# Patient Record
Sex: Female | Born: 1985 | Race: Black or African American | Hispanic: No | Marital: Single | Smoking: Never smoker
Health system: Southern US, Community
[De-identification: ages and names within clinical notes are randomized; demographics above are authoritative.]

## PROBLEM LIST (undated history)

## (undated) ENCOUNTER — Inpatient Hospital Stay (HOSPITAL_COMMUNITY): Payer: Self-pay

## (undated) DIAGNOSIS — J189 Pneumonia, unspecified organism: Secondary | ICD-10-CM

## (undated) DIAGNOSIS — IMO0002 Reserved for concepts with insufficient information to code with codable children: Secondary | ICD-10-CM

## (undated) DIAGNOSIS — I10 Essential (primary) hypertension: Secondary | ICD-10-CM

## (undated) DIAGNOSIS — D649 Anemia, unspecified: Secondary | ICD-10-CM

## (undated) DIAGNOSIS — R87619 Unspecified abnormal cytological findings in specimens from cervix uteri: Secondary | ICD-10-CM

## (undated) HISTORY — PX: FACIAL RECONSTRUCTION SURGERY: SHX631

---
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
## 2003-03-21 ENCOUNTER — Inpatient Hospital Stay (HOSPITAL_COMMUNITY): Admission: AD | Admit: 2003-03-21 | Discharge: 2003-03-21 | Payer: Self-pay | Admitting: Family Medicine

## 2003-03-25 ENCOUNTER — Inpatient Hospital Stay (HOSPITAL_COMMUNITY): Admission: AD | Admit: 2003-03-25 | Discharge: 2003-03-25 | Payer: Self-pay | Admitting: Obstetrics and Gynecology

## 2003-06-05 ENCOUNTER — Emergency Department (HOSPITAL_COMMUNITY): Admission: EM | Admit: 2003-06-05 | Discharge: 2003-06-05 | Payer: Self-pay

## 2003-08-28 ENCOUNTER — Inpatient Hospital Stay (HOSPITAL_COMMUNITY): Admission: AD | Admit: 2003-08-28 | Discharge: 2003-08-28 | Payer: Self-pay | Admitting: Family Medicine

## 2004-04-08 ENCOUNTER — Emergency Department (HOSPITAL_COMMUNITY): Admission: EM | Admit: 2004-04-08 | Discharge: 2004-04-08 | Payer: Self-pay | Admitting: Family Medicine

## 2005-02-25 ENCOUNTER — Inpatient Hospital Stay (HOSPITAL_COMMUNITY): Admission: AD | Admit: 2005-02-25 | Discharge: 2005-02-25 | Payer: Self-pay | Admitting: Obstetrics and Gynecology

## 2005-03-02 ENCOUNTER — Inpatient Hospital Stay (HOSPITAL_COMMUNITY): Admission: AD | Admit: 2005-03-02 | Discharge: 2005-03-04 | Payer: Self-pay | Admitting: *Deleted

## 2005-03-02 ENCOUNTER — Ambulatory Visit: Payer: Self-pay | Admitting: Obstetrics and Gynecology

## 2006-01-09 ENCOUNTER — Emergency Department (HOSPITAL_COMMUNITY): Admission: EM | Admit: 2006-01-09 | Discharge: 2006-01-09 | Payer: Self-pay | Admitting: Emergency Medicine

## 2006-02-26 ENCOUNTER — Emergency Department (HOSPITAL_COMMUNITY): Admission: EM | Admit: 2006-02-26 | Discharge: 2006-02-26 | Payer: Self-pay | Admitting: Emergency Medicine

## 2006-06-18 ENCOUNTER — Emergency Department (HOSPITAL_COMMUNITY): Admission: EM | Admit: 2006-06-18 | Discharge: 2006-06-18 | Payer: Self-pay | Admitting: Emergency Medicine

## 2007-01-15 ENCOUNTER — Other Ambulatory Visit: Admission: RE | Admit: 2007-01-15 | Discharge: 2007-01-15 | Payer: Self-pay | Admitting: Gynecology

## 2007-03-30 ENCOUNTER — Emergency Department (HOSPITAL_COMMUNITY): Admission: EM | Admit: 2007-03-30 | Discharge: 2007-03-31 | Payer: Self-pay | Admitting: Emergency Medicine

## 2008-04-30 ENCOUNTER — Emergency Department (HOSPITAL_COMMUNITY): Admission: EM | Admit: 2008-04-30 | Discharge: 2008-04-30 | Payer: Self-pay | Admitting: Emergency Medicine

## 2009-03-27 ENCOUNTER — Inpatient Hospital Stay (HOSPITAL_COMMUNITY): Admission: AD | Admit: 2009-03-27 | Discharge: 2009-03-27 | Payer: Self-pay | Admitting: Maternal and Fetal Medicine

## 2009-08-09 ENCOUNTER — Inpatient Hospital Stay (HOSPITAL_COMMUNITY): Admission: AD | Admit: 2009-08-09 | Discharge: 2009-08-20 | Payer: Self-pay | Admitting: Obstetrics and Gynecology

## 2009-08-10 ENCOUNTER — Encounter: Payer: Self-pay | Admitting: Obstetrics and Gynecology

## 2009-08-11 ENCOUNTER — Encounter (HOSPITAL_COMMUNITY): Payer: Self-pay | Admitting: Obstetrics and Gynecology

## 2009-08-17 ENCOUNTER — Encounter (INDEPENDENT_AMBULATORY_CARE_PROVIDER_SITE_OTHER): Payer: Self-pay | Admitting: Obstetrics and Gynecology

## 2009-09-30 ENCOUNTER — Emergency Department (HOSPITAL_COMMUNITY): Admission: EM | Admit: 2009-09-30 | Discharge: 2009-09-30 | Payer: Self-pay | Admitting: Emergency Medicine

## 2010-04-25 ENCOUNTER — Emergency Department (HOSPITAL_COMMUNITY): Admission: EM | Admit: 2010-04-25 | Discharge: 2010-04-26 | Payer: Self-pay | Admitting: Emergency Medicine

## 2010-10-09 ENCOUNTER — Encounter: Payer: Self-pay | Admitting: Obstetrics and Gynecology

## 2010-12-20 LAB — CBC
Hemoglobin: 11.7 g/dL — ABNORMAL LOW (ref 12.0–15.0)
RBC: 5.1 MIL/uL (ref 3.87–5.11)
RDW: 14.7 % (ref 11.5–15.5)

## 2010-12-21 LAB — CBC
HCT: 38.8 % (ref 36.0–46.0)
Hemoglobin: 10.9 g/dL — ABNORMAL LOW (ref 12.0–15.0)
Hemoglobin: 12.2 g/dL (ref 12.0–15.0)
Hemoglobin: 12.3 g/dL (ref 12.0–15.0)
MCHC: 31.5 g/dL (ref 30.0–36.0)
MCHC: 32.2 g/dL (ref 30.0–36.0)
MCHC: 32.4 g/dL (ref 30.0–36.0)
MCV: 70.9 fL — ABNORMAL LOW (ref 78.0–100.0)
MCV: 72.3 fL — ABNORMAL LOW (ref 78.0–100.0)
RBC: 4.71 MIL/uL (ref 3.87–5.11)
RBC: 5.35 MIL/uL — ABNORMAL HIGH (ref 3.87–5.11)
RDW: 15.4 % (ref 11.5–15.5)
RDW: 15.4 % (ref 11.5–15.5)
WBC: 10 10*3/uL (ref 4.0–10.5)

## 2010-12-21 LAB — TYPE AND SCREEN
ABO/RH(D): O POS
Antibody Screen: NEGATIVE

## 2010-12-21 LAB — WET PREP, GENITAL
Trich, Wet Prep: NONE SEEN
Yeast Wet Prep HPF POC: NONE SEEN

## 2010-12-21 LAB — COMPREHENSIVE METABOLIC PANEL
Alkaline Phosphatase: 79 U/L (ref 39–117)
BUN: 6 mg/dL (ref 6–23)
Calcium: 7.8 mg/dL — ABNORMAL LOW (ref 8.4–10.5)
Glucose, Bld: 102 mg/dL — ABNORMAL HIGH (ref 70–99)
Total Protein: 7.3 g/dL (ref 6.0–8.3)

## 2010-12-21 LAB — URIC ACID: Uric Acid, Serum: 4.2 mg/dL (ref 2.4–7.0)

## 2010-12-21 LAB — LACTATE DEHYDROGENASE: LDH: 118 U/L (ref 94–250)

## 2010-12-25 LAB — URINALYSIS, ROUTINE W REFLEX MICROSCOPIC
Bilirubin Urine: NEGATIVE
Nitrite: NEGATIVE
Specific Gravity, Urine: 1.02 (ref 1.005–1.030)
Urobilinogen, UA: 0.2 mg/dL (ref 0.0–1.0)

## 2010-12-25 LAB — GC/CHLAMYDIA PROBE AMP, GENITAL: Chlamydia, DNA Probe: NEGATIVE

## 2010-12-25 LAB — WET PREP, GENITAL

## 2010-12-25 LAB — CBC
MCHC: 32.9 g/dL (ref 30.0–36.0)
MCV: 69.2 fL — ABNORMAL LOW (ref 78.0–100.0)
Platelets: 267 10*3/uL (ref 150–400)
RDW: 16.7 % — ABNORMAL HIGH (ref 11.5–15.5)
WBC: 8.6 10*3/uL (ref 4.0–10.5)

## 2010-12-25 LAB — HCG, QUANTITATIVE, PREGNANCY: hCG, Beta Chain, Quant, S: 14673 m[IU]/mL — ABNORMAL HIGH (ref <5)

## 2011-01-27 ENCOUNTER — Inpatient Hospital Stay (HOSPITAL_COMMUNITY)
Admission: AD | Admit: 2011-01-27 | Discharge: 2011-01-27 | Disposition: A | Discharge status: Home / Self Care | Payer: Self-pay | Source: Ambulatory Visit | Attending: Obstetrics and Gynecology | Admitting: Obstetrics and Gynecology

## 2011-01-27 ENCOUNTER — Other Ambulatory Visit: Payer: Self-pay | Admitting: Obstetrics and Gynecology

## 2011-01-27 DIAGNOSIS — N949 Unspecified condition associated with female genital organs and menstrual cycle: Secondary | ICD-10-CM | POA: Insufficient documentation

## 2011-01-27 DIAGNOSIS — N938 Other specified abnormal uterine and vaginal bleeding: Secondary | ICD-10-CM | POA: Insufficient documentation

## 2011-01-27 LAB — POCT PREGNANCY, URINE: Preg Test, Ur: NEGATIVE

## 2011-01-30 LAB — URINALYSIS, ROUTINE W REFLEX MICROSCOPIC
Nitrite: NEGATIVE
Specific Gravity, Urine: 1.02 (ref 1.005–1.030)
Urobilinogen, UA: 0.2 mg/dL (ref 0.0–1.0)

## 2011-02-03 NOTE — Discharge Summary (Signed)
NAMEGREENLEE, ANCHETA                ACCOUNT NO.:  [id number]   MEDICAL RECORD NO.:  [id number]          PATIENT TYPE:  INP   LOCATION:  9317                          FACILITY:  [facility]   PHYSICIAN:  Phil D. Okey Dupre, M.D.     DATE OF BIRTH:  1986-08-05   DATE OF ADMISSION:  03/02/2005  DATE OF DISCHARGE:  03/04/2005                                 DISCHARGE SUMMARY   The patient is a 25 year old black female who was admitted with primary  herpes simplex infection extraordinarily severe that did not allow her to  avoid.  A Foley catheter was placed.  The patient has been on two days of  Acyclovir IV and markedly improved.  She also was positive for Chlamydia and  was treated with Rocephin.  She is ready to go home.  Foley catheter will be  removed.  I will have the patient come back to the GYN clinic in 2 weeks.  She is to continue sitz baths at home and Valtrex and Percocet for pain.  We  have also discussed with her the necessity of getting her [facility] to the  [facility] for suppression of herpes simplex as well as treated for  Chlamydia, and she said she is going to take care of that.   IMPRESSION:  Resolving primary herpes simplex, vulvitis.       PDR/MEDQ  D:  03/04/2005  T:  03/04/2005  Job:  [id number]

## 2011-04-19 ENCOUNTER — Emergency Department (HOSPITAL_COMMUNITY)
Admission: EM | Admit: 2011-04-19 | Discharge: 2011-04-19 | Disposition: A | Discharge status: Home / Self Care | Payer: Self-pay | Attending: Emergency Medicine | Admitting: Emergency Medicine

## 2011-04-19 DIAGNOSIS — K029 Dental caries, unspecified: Secondary | ICD-10-CM | POA: Insufficient documentation

## 2011-04-19 DIAGNOSIS — K0381 Cracked tooth: Secondary | ICD-10-CM | POA: Insufficient documentation

## 2011-04-19 DIAGNOSIS — K089 Disorder of teeth and supporting structures, unspecified: Secondary | ICD-10-CM | POA: Insufficient documentation

## 2011-04-19 DIAGNOSIS — K047 Periapical abscess without sinus: Secondary | ICD-10-CM | POA: Insufficient documentation

## 2011-04-19 DIAGNOSIS — R220 Localized swelling, mass and lump, head: Secondary | ICD-10-CM | POA: Insufficient documentation

## 2011-07-04 LAB — HEMOGLOBIN AND HEMATOCRIT, BLOOD: Hemoglobin: 10.8 — ABNORMAL LOW

## 2011-07-04 LAB — D-DIMER, QUANTITATIVE: D-Dimer, Quant: <0.22

## 2011-07-29 ENCOUNTER — Other Ambulatory Visit: Payer: Self-pay

## 2011-07-29 ENCOUNTER — Emergency Department (HOSPITAL_COMMUNITY)
Admission: EM | Admit: 2011-07-29 | Discharge: 2011-07-29 | Disposition: A | Discharge status: Home / Self Care | Payer: Self-pay | Attending: Emergency Medicine | Admitting: Emergency Medicine

## 2011-07-29 ENCOUNTER — Emergency Department (HOSPITAL_COMMUNITY): Payer: Self-pay

## 2011-07-29 ENCOUNTER — Encounter: Payer: Self-pay | Admitting: *Deleted

## 2011-07-29 DIAGNOSIS — D649 Anemia, unspecified: Secondary | ICD-10-CM | POA: Insufficient documentation

## 2011-07-29 DIAGNOSIS — R079 Chest pain, unspecified: Secondary | ICD-10-CM | POA: Insufficient documentation

## 2011-07-29 LAB — POCT I-STAT, CHEM 8
BUN: 14 mg/dL (ref 6–23)
Calcium, Ion: 1.17 mmol/L (ref 1.12–1.32)
Creatinine, Ser: 0.7 mg/dL (ref 0.50–1.10)
TCO2: 26 mmol/L (ref 0–100)

## 2011-07-29 LAB — URINALYSIS, ROUTINE W REFLEX MICROSCOPIC
Bilirubin Urine: NEGATIVE
Nitrite: NEGATIVE
Protein, ur: NEGATIVE mg/dL
Specific Gravity, Urine: 1.027 (ref 1.005–1.030)
Urobilinogen, UA: 0.2 mg/dL (ref 0.0–1.0)

## 2011-07-29 LAB — URINE MICROSCOPIC-ADD ON

## 2011-07-29 LAB — CBC
HCT: 24.2 % — ABNORMAL LOW (ref 36.0–46.0)
MCV: 58.5 fL — ABNORMAL LOW (ref 78.0–100.0)
RDW: 19.8 % — ABNORMAL HIGH (ref 11.5–15.5)
WBC: 9.4 10*3/uL (ref 4.0–10.5)

## 2011-07-29 LAB — TROPONIN I: Troponin I: <0.3 ng/mL (ref <0.30)

## 2011-07-29 LAB — BASIC METABOLIC PANEL
BUN: 13 mg/dL (ref 6–23)
Chloride: 101 mEq/L (ref 96–112)
Creatinine, Ser: 0.68 mg/dL (ref 0.50–1.10)
GFR calc Af Amer: >90 mL/min (ref >90)

## 2011-07-29 LAB — ABO/RH: ABO/RH(D): O POS

## 2011-07-29 LAB — PREGNANCY, URINE: Preg Test, Ur: NEGATIVE

## 2011-07-29 MED ORDER — ASPIRIN 325 MG PO TABS: 325.0000 mg | ORAL_TABLET | ORAL | Status: DC

## 2011-07-29 MED ORDER — FERROUS SULFATE 325 (65 FE) MG PO TABS
325.0000 mg | ORAL_TABLET | Freq: Once | ORAL | Status: AC
  Administered 2011-07-29: 325 mg via ORAL
  Filled 2011-07-29 (×2): qty 1

## 2011-07-29 MED ORDER — FERROUS SULFATE 325 (65 FE) MG PO TABS: 325.0000 mg | ORAL_TABLET | Freq: Every day | ORAL | Status: DC

## 2011-07-29 NOTE — ED Notes (Signed)
Received pt through change of shift report. Family at bedside. Pt resting with eyes closed.

## 2011-07-29 NOTE — ED Notes (Signed)
Pt c/o CP since 2-3 am this morning.  States pain increases with movement and inspiration.

## 2011-07-29 NOTE — ED Notes (Signed)
Developed CP around 1300 Friday afternoon, constant, stabbing, pinpoints with one finger to L upper chest, also sob & light headedness, (denies other sx), no aggravating factors, eased off with lortab elixer, also took gas x & tums, rates 8/10.

## 2011-07-29 NOTE — ED Notes (Signed)
LMP 06/23/2011, "period/bleeding still continues" per pt

## 2011-07-29 NOTE — ED Provider Notes (Signed)
History     CSN: [id number] Arrival date & time: 07/29/2011  2:32 AM   First MD Initiated Contact with Patient 07/29/11 [phone]      Chief Complaint  Patient presents with  . Chest Pain    (Consider location/radiation/quality/duration/timing/severity/associated sxs/prior treatment) HPI The patient is a 25 year old female who states that she has had chest pain that began at 1 PM on Friday. She has pain in her left chest that she describes as constant and stabbing. She rates this as an 8/10. Patient initially reports no aggravating factors. She does tell me that this is worse when she is angry or upset about something. Patient does not have a history of anemia but has had heavy vaginal bleeding for the past for a half. She states that she was seen by OB/GYN about this and tested for STDs but no further workup was done by her report. Patient is not on high-dose NSAIDs for this or any form of oral contraceptives. Patient currently has no pain while laying in her stretcher. She denies any shortness of breath, nausea, or vomiting. Earlier the patient took Gas-X without any relief. She did take Lortab which seemed to help her out. There are no other associated modifying factors. History reviewed. No pertinent past medical history.  Past Surgical History  Procedure Date  . Cesarean section     Family History  Problem Relation Age of Onset  . Stroke Mother   . Diabetes Mother   . Hypertension Mother   . Coronary artery disease Mother   . Hypertension Father     History  Substance Use Topics  . Smoking status: Former Smoker    Quit date: 07/28/2004  . Smokeless tobacco: Not on file  . Alcohol Use: Yes     occaisionally    OB History    Grav Para Term Preterm Abortions TAB SAB Ect Mult Living                  Review of Systems  Constitutional: Negative.   HENT: Negative.   Eyes: Negative.   Respiratory: Negative.   Cardiovascular: Positive for chest pain.  Gastrointestinal:  Negative.   Genitourinary: Positive for vaginal bleeding.  Musculoskeletal: Negative.   Skin: Negative.   Neurological: Negative.   Hematological: Negative.   Psychiatric/Behavioral: Negative.   All other systems reviewed and are negative.    Allergies  Latex and Oxycontin  Home Medications   Current Outpatient Rx  Name Route Sig Dispense Refill  . ONE-A-DAY ENERGY PO Oral Take 1 tablet by mouth daily.      Marland Kitchen FERROUS SULFATE 325 (65 FE) MG PO TABS Oral Take 1 tablet (325 mg total) by mouth daily. 30 tablet 0    BP 110/58  Pulse 96  Temp(Src) 98.3 F (36.8 C) (Oral)  Resp 16  SpO2 100%  LMP 06/23/2011  Physical Exam  Nursing note and vitals reviewed. Constitutional: She is oriented to person, place, and time. She appears well-developed and well-nourished. No distress.  HENT:  Head: Normocephalic and atraumatic.  Eyes: Conjunctivae and EOM are normal. Pupils are equal, round, and reactive to light.  Neck: Normal range of motion.  Cardiovascular: Normal rate, regular rhythm, normal heart sounds and intact distal pulses.  Exam reveals no gallop and no friction rub.   No murmur heard. Pulmonary/Chest: Breath sounds normal. No respiratory distress. She has no wheezes. She has no rales.  Abdominal: Soft. Bowel sounds are normal. She exhibits no distension. There is no tenderness.  There is no rebound and no guarding.  Musculoskeletal: Normal range of motion.  Neurological: She is alert and oriented to person, place, and time. No cranial nerve deficit. Coordination normal.  Skin: Skin is warm and dry. No rash noted. No erythema.  Psychiatric: She has a normal mood and affect.    ED Course  Procedures (including critical care time)  Date: 07/29/2011  Rate: 74  Rhythm: normal sinus rhythm  QRS Axis: normal  Intervals: normal  ST/T Wave abnormalities: normal  Conduction Disutrbances:none  Narrative Interpretation: No finding of acute ischemia  Old EKG Reviewed: none  available and unchanged  Labs Reviewed  CBC - Abnormal; Notable for the following:    Hemoglobin 7.2 (*)    HCT 24.2 (*)    MCV 58.5 (*)    MCH 17.4 (*)    MCHC 29.8 (*)    RDW 19.8 (*)    All other components within normal limits  POCT I-STAT, CHEM 8 - Abnormal; Notable for the following:    Hemoglobin 9.5 (*)    HCT 28.0 (*)    All other components within normal limits  URINALYSIS, ROUTINE W REFLEX MICROSCOPIC - Abnormal; Notable for the following:    Hgb urine dipstick MODERATE (*)    All other components within normal limits  BASIC METABOLIC PANEL  TROPONIN I  TYPE AND SCREEN  PREGNANCY, URINE  URINE MICROSCOPIC-ADD ON  ABO/RH  PREPARE RBC (CROSSMATCH)  I-STAT TROPONIN I   Dg Chest 2 View  07/29/2011  *RADIOLOGY REPORT*  Clinical Data: Chest pain, shortness of breath and syncope; dizziness.  CHEST - 2 VIEW  Comparison: Chest radiograph performed 09/30/2009  Findings: The lungs are well-aerated and clear.  There is no evidence of focal opacification, pleural effusion or pneumothorax. There is mild stable prominence of vasculature at the left midlung zone.  The heart is borderline normal in size; the mediastinal contour is within normal limits.  No acute osseous abnormalities are seen.  IMPRESSION: No acute cardiopulmonary process seen.  Original Report Authenticated By: Tonia Ghent, M.D.     1. Anemia   2. Chest pain       MDM  The patient was evaluated by myself. Though she initially had chest pain this had resolved by the time of my evaluation. Patient had CBC and i-STAT as well as renal panel and chest x-ray with EKG. EKG was unremarkable. Patient's initial CBC with her hemoglobin was 7.2. Within 7 minutes and additional lab reported the hemoglobin to be 9.5. Patient did not have any chest pain, lightheadedness or shortness of breath prior to discharge today. I did speak to numerous OB/GYN Strine to reach someone who had seen the patient previously. She was unable to  recall who that physician was. Patient reported that her bleeding was down to 3 pads a day. This was tapering off from what it had been earlier in the month. Troponin and chest x-ray were also negative. Patient was given a dose of iron and was written for a prescription for this. She was told to followup with her OB/GYN that she saw previously. She is to return immediately if she develops chest pain worsening bleeding or shortness of breath. Patient was discharged in good condition.        Cyndra Numbers, MD 07/29/11 1037

## 2011-07-29 NOTE — ED Notes (Signed)
0240 VS deleted (wrong pt).

## 2011-07-29 NOTE — ED Notes (Signed)
Awaiting medication from pharmacy.

## 2011-07-31 LAB — TYPE AND SCREEN
Unit division: 0
Unit division: 0

## 2011-09-16 ENCOUNTER — Inpatient Hospital Stay (HOSPITAL_COMMUNITY)
Admission: AD | Admit: 2011-09-16 | Discharge: 2011-09-16 | Disposition: A | Discharge status: Home / Self Care | Payer: Self-pay | Source: Ambulatory Visit | Attending: Obstetrics & Gynecology | Admitting: Obstetrics & Gynecology

## 2011-09-16 ENCOUNTER — Encounter (HOSPITAL_COMMUNITY): Payer: Self-pay

## 2011-09-16 DIAGNOSIS — N938 Other specified abnormal uterine and vaginal bleeding: Secondary | ICD-10-CM | POA: Insufficient documentation

## 2011-09-16 DIAGNOSIS — D649 Anemia, unspecified: Secondary | ICD-10-CM | POA: Insufficient documentation

## 2011-09-16 DIAGNOSIS — N939 Abnormal uterine and vaginal bleeding, unspecified: Secondary | ICD-10-CM

## 2011-09-16 DIAGNOSIS — N949 Unspecified condition associated with female genital organs and menstrual cycle: Secondary | ICD-10-CM | POA: Insufficient documentation

## 2011-09-16 DIAGNOSIS — N898 Other specified noninflammatory disorders of vagina: Secondary | ICD-10-CM

## 2011-09-16 HISTORY — DX: Anemia, unspecified: D64.9

## 2011-09-16 LAB — CBC
HCT: 28.2 % — ABNORMAL LOW (ref 36.0–46.0)
Hemoglobin: 8.3 g/dL — ABNORMAL LOW (ref 12.0–15.0)
MCH: 17 pg — ABNORMAL LOW (ref 26.0–34.0)
MCHC: 29.4 g/dL — ABNORMAL LOW (ref 30.0–36.0)
MCV: 57.9 fL — ABNORMAL LOW (ref 78.0–100.0)

## 2011-09-16 LAB — URINALYSIS, ROUTINE W REFLEX MICROSCOPIC
Leukocytes, UA: NEGATIVE
Specific Gravity, Urine: <1.005 — ABNORMAL LOW (ref 1.005–1.030)
Urobilinogen, UA: 0.2 mg/dL (ref 0.0–1.0)

## 2011-09-16 LAB — WET PREP, GENITAL: Yeast Wet Prep HPF POC: NONE SEEN

## 2011-09-16 LAB — POCT PREGNANCY, URINE: Preg Test, Ur: NEGATIVE

## 2011-09-16 LAB — URINE MICROSCOPIC-ADD ON

## 2011-09-16 MED ORDER — NORGESTIMATE-ETH ESTRADIOL 0.25-35 MG-MCG PO TABS: 1.0000 | ORAL_TABLET | Freq: Every day | ORAL | Status: DC

## 2011-09-16 NOTE — Progress Notes (Signed)
Pt reports having vaginal bleeding x 2 weeks.  Had period at beginning of December and then one started 2 weeks after. Pt has felt weak and went to [facility] and passed a clot the size of a baseball.

## 2011-09-16 NOTE — ED Provider Notes (Signed)
History     Chief Complaint  Patient presents with  . Vaginal Bleeding   HPI Brenda [name] 25 y.o. LMP 08-28-11  Has continued to bleed for 2 weeks.  Today had large clot and she became very worried about the bleeding.  Has never had bleeding like this before.  Uses condoms for contraception.  Has taken pills previously with no problems.  OB History    Grav Para Term Preterm Abortions TAB SAB Ect Mult Living   1 1  1      1       Past Medical History  Diagnosis Date  . Anemia   . Asthma     Past Surgical History  Procedure Date  . Cesarean section     Family History  Problem Relation Age of Onset  . Stroke Mother   . Diabetes Mother   . Hypertension Mother   . Coronary artery disease Mother   . Hypertension Father     History  Substance Use Topics  . Smoking status: Former Smoker    Quit date: 07/28/2004  . Smokeless tobacco: Not on file  . Alcohol Use: Yes     occaisionally    Allergies:  Allergies  Allergen Reactions  . Latex Itching  . Oxycontin Hives    Prescriptions prior to admission  Medication Sig Dispense Refill  . ferrous sulfate 325 (65 FE) MG tablet Take 1 tablet (325 mg total) by mouth daily.  30 tablet  0  . Multiple Vitamin (MULITIVITAMIN WITH MINERALS) TABS Take 1 tablet by mouth daily.        . pseudoephedrine-ibuprofen (CHILDREN'S MOTRIN COLD) 15-100 MG/5ML suspension Take 10 mLs by mouth daily as needed. Cold symptoms         Review of Systems  Gastrointestinal: Negative for abdominal pain.  Genitourinary:       Vaginal bleeding   Physical Exam   Blood pressure 137/70, pulse 72, temperature 98.8 F (37.1 C), temperature source Oral, resp. rate 18, height 5\' 9"  (1.753 m), weight 269 lb (122.018 kg), last menstrual period 08/28/2011.  Physical Exam  Nursing note and vitals reviewed. Constitutional: She is oriented to person, place, and time. She appears well-developed.       obese  HENT:  Head: Normocephalic.  Eyes: EOM are  normal.  Neck: Neck supple.  GI: Soft. There is no tenderness.  Genitourinary:       Speculum exam: Vagina - Mod amount of bloodyudischarge Cervix - No contact bleeding Bimanual exam: Cervix closed (int os) Uterus non tender, exam limited due to habitus Adnexa non tender, exam limited due to habitus GC/Chlam, wet prep done Chaperone present for exam.    Musculoskeletal: Normal range of motion.  Neurological: She is alert and oriented to person, place, and time.  Skin: Skin is warm and dry.  Psychiatric: She has a normal mood and affect.    MAU Course  Procedures  MDM Results for orders placed during the hospital encounter of 09/16/11 (from the past 24 hour(s))  URINALYSIS, ROUTINE W REFLEX MICROSCOPIC     Status: Abnormal   Collection Time   09/16/11  5:50 PM      Component Value Range   Color, Urine YELLOW  YELLOW    APPearance CLEAR  CLEAR    Specific Gravity, Urine <1.005 (*) 1.005 - 1.030    pH 6.0  5.0 - 8.0    Glucose, UA NEGATIVE  NEGATIVE (mg/dL)   Hgb urine dipstick LARGE (*) NEGATIVE  Bilirubin Urine NEGATIVE  NEGATIVE    Ketones, ur NEGATIVE  NEGATIVE (mg/dL)   Protein, ur NEGATIVE  NEGATIVE (mg/dL)   Urobilinogen, UA 0.2  0.0 - 1.0 (mg/dL)   Nitrite NEGATIVE  NEGATIVE    Leukocytes, UA NEGATIVE  NEGATIVE   URINE MICROSCOPIC-ADD ON     Status: Normal   Collection Time   09/16/11  5:50 PM      Component Value Range   Squamous Epithelial / LPF RARE  RARE    WBC, UA 0-2  <3 (WBC/hpf)   RBC / HPF 21-50  <3 (RBC/hpf)  POCT PREGNANCY, URINE     Status: Normal   Collection Time   09/16/11  5:59 PM      Component Value Range   Preg Test, Ur NEGATIVE    WET PREP, GENITAL     Status: Abnormal   Collection Time   09/16/11  6:40 PM      Component Value Range   Yeast, Wet Prep NONE SEEN  NONE SEEN    Trich, Wet Prep NONE SEEN  NONE SEEN    Clue Cells, Wet Prep FEW (*) NONE SEEN    WBC, Wet Prep HPF POC NONE SEEN  NONE SEEN   CBC     Status: Abnormal    Collection Time   09/16/11  6:53 PM      Component Value Range   WBC 7.8  4.0 - 10.5 (K/uL)   RBC 4.87  3.87 - 5.11 (MIL/uL)   Hemoglobin 8.3 (*) 12.0 - 15.0 (g/dL)   HCT 16.1 (*) 09.6 - 46.0 (%)   MCV 57.9 (*) 78.0 - 100.0 (fL)   MCH 17.0 (*) 26.0 - 34.0 (pg)   MCHC 29.4 (*) 30.0 - 36.0 (g/dL)   RDW 04.5 (*) 40.9 - 15.5 (%)   Platelets 288  150 - 400 (K/uL)     Assessment and Plan  Abn vaginal bleeding Anemia  Plan Rx one pack of birth control pills Begin birth control pills today Get an over the counter iron supplement and take by the package directions. Make an appointment with your provider Return if you start having heavy bleeding again. Continue condoms for contraception.  Your pills will not be pregnancy protection until you begin pack number 2.  Brenda [name] 09/16/2011, 6:38 PM   Brenda Bernheim, NP 09/16/11 1949

## 2011-09-18 LAB — GC/CHLAMYDIA PROBE AMP, GENITAL
Chlamydia, DNA Probe: NEGATIVE
GC Probe Amp, Genital: NEGATIVE

## 2012-08-31 ENCOUNTER — Encounter (HOSPITAL_COMMUNITY): Payer: Self-pay | Admitting: Obstetrics and Gynecology

## 2012-08-31 ENCOUNTER — Inpatient Hospital Stay (HOSPITAL_COMMUNITY): Payer: BC Managed Care – PPO

## 2012-08-31 ENCOUNTER — Other Ambulatory Visit: Payer: Self-pay

## 2012-08-31 ENCOUNTER — Observation Stay (HOSPITAL_COMMUNITY)
Admission: AD | Admit: 2012-08-31 | Discharge: 2012-09-01 | Disposition: A | Discharge status: Home / Self Care | Payer: BC Managed Care – PPO | Source: Ambulatory Visit | Attending: Obstetrics and Gynecology | Admitting: Obstetrics and Gynecology

## 2012-08-31 DIAGNOSIS — N938 Other specified abnormal uterine and vaginal bleeding: Principal | ICD-10-CM

## 2012-08-31 DIAGNOSIS — N949 Unspecified condition associated with female genital organs and menstrual cycle: Secondary | ICD-10-CM | POA: Insufficient documentation

## 2012-08-31 DIAGNOSIS — D500 Iron deficiency anemia secondary to blood loss (chronic): Secondary | ICD-10-CM | POA: Insufficient documentation

## 2012-08-31 DIAGNOSIS — D649 Anemia, unspecified: Secondary | ICD-10-CM

## 2012-08-31 HISTORY — DX: Reserved for concepts with insufficient information to code with codable children: IMO0002

## 2012-08-31 HISTORY — DX: Unspecified abnormal cytological findings in specimens from cervix uteri: R87.619

## 2012-08-31 LAB — CBC
HCT: 20.3 % — ABNORMAL LOW (ref 36.0–46.0)
Hemoglobin: 5.3 g/dL — CL (ref 12.0–15.0)
MCH: 13.7 pg — ABNORMAL LOW (ref 26.0–34.0)
MCHC: 26.1 g/dL — ABNORMAL LOW (ref 30.0–36.0)
MCV: 52.6 fL — ABNORMAL LOW (ref 78.0–100.0)
Platelets: 288 10*3/uL (ref 150–400)
RBC: 3.86 MIL/uL — ABNORMAL LOW (ref 3.87–5.11)
RDW: 22.3 % — ABNORMAL HIGH (ref 11.5–15.5)
WBC: 9.6 10*3/uL (ref 4.0–10.5)

## 2012-08-31 LAB — WET PREP, GENITAL
Trich, Wet Prep: NONE SEEN
Yeast Wet Prep HPF POC: NONE SEEN

## 2012-08-31 LAB — SAMPLE TO BLOOD BANK

## 2012-08-31 MED ORDER — SODIUM CHLORIDE 0.9 % IJ SOLN: 3.0000 mL | Freq: Two times a day (BID) | INTRAMUSCULAR | Status: DC

## 2012-08-31 MED ORDER — SODIUM CHLORIDE 0.9 % IV SOLN
INTRAVENOUS | Status: DC
  Administered 2012-08-31: 125 mL/h via INTRAVENOUS

## 2012-08-31 MED ORDER — ACETAMINOPHEN 325 MG PO TABS
650.0000 mg | ORAL_TABLET | Freq: Once | ORAL | Status: AC
  Administered 2012-08-31: 650 mg via ORAL
  Filled 2012-08-31: qty 2

## 2012-08-31 MED ORDER — MEDROXYPROGESTERONE ACETATE 150 MG/ML IM SUSP
150.0000 mg | Freq: Once | INTRAMUSCULAR | Status: AC
  Administered 2012-08-31: 150 mg via INTRAMUSCULAR
  Filled 2012-08-31: qty 1

## 2012-08-31 MED ORDER — DIPHENHYDRAMINE HCL 25 MG PO CAPS
25.0000 mg | ORAL_CAPSULE | Freq: Once | ORAL | Status: AC
  Administered 2012-08-31: 25 mg via ORAL
  Filled 2012-08-31: qty 1

## 2012-08-31 NOTE — Plan of Care (Signed)
Problem: Consults Goal: General Medical Patient Education See Patient Education Module for specific education. Outcome: Completed/Met Date Met:  08/31/12 Transfusion protocol explained to patient,consent was signed and signs and symptoms of a reaction discussed.  Problem: Phase I Progression Outcomes Goal: Pain controlled with appropriate interventions Outcome: Completed/Met Date Met:  08/31/12 Denies need for pain med at this time, Goal: OOB as tolerated unless otherwise ordered Outcome: Completed/Met Date Met:  08/31/12 Ambulates to bathroom with standby but is not dizzy or faint. Goal: Initial discharge plan identified Outcome: Completed/Met Date Met:  08/31/12 VSS  Bleeding controlled Hgb stable Goal: Hemodynamically stable Outcome: Completed/Met Date Met:  08/31/12 VSS,Bp a little elevated. Goal: Other Phase I Outcomes/Goals Outcome: Progressing Increased hgb

## 2012-08-31 NOTE — MAU Provider Note (Signed)
History     CSN: [id number]  Arrival date & time 08/31/12  1617   None     Chief Complaint  Patient presents with  . Vaginal Bleeding    (Consider location/radiation/quality/duration/timing/severity/associated sxs/prior treatment) HPI Brenda [name] is a 26 y.o. [id number].  LMP 11/1, she has not stopped bleeding since. She changes a pad q 20-30 minutes, has clots up top 6-7 cm, cramping. Past 3-4 days bleeding has decreased. She has headaches and chest pain, feels tired. She has an appt next week with Dr Romine's office, couldn't wait till then.   She took Ortho Tri cyclen July - Oct last year, no hormonal tx since. One partner x 10 yr, neg STD screen with 2010 preg.   Past Medical History  Diagnosis Date  . Anemia   . Asthma     Past Surgical History  Procedure Date  . Cesarean section     2010    Family History  Problem Relation Age of Onset  . Stroke Mother   . Diabetes Mother   . Hypertension Mother   . Coronary artery disease Mother   . Hypertension Father     History  Substance Use Topics  . Smoking status: Former Smoker    Quit date: 07/28/2004  . Smokeless tobacco: Not on file  . Alcohol Use: Yes     Comment: occaisionally    OB History    Grav Para Term Preterm Abortions TAB SAB Ect Mult Living   2 1  1 1  1   1       Review of Systems  Constitutional: Positive for fatigue. Negative for fever and chills.  Genitourinary: Positive for vaginal bleeding. Negative for dysuria, urgency, frequency and vaginal discharge.    Allergies  Latex and Oxycodone hcl er  Home Medications  No current outpatient prescriptions on file.  BP 123/62  Pulse 91  Temp 98.4 F (36.9 C) (Oral)  Resp 18  Ht 5\' 9"  (1.753 m)  Wt 271 lb 12.8 oz (123.288 kg)  BMI 40.14 kg/m2  SpO2 100%  LMP 08/18/2012  Physical Exam  Constitutional: She is oriented to person, place, and time. She appears well-developed and well-nourished.  Abdominal: Soft. There is no tenderness.   Genitourinary:       Pelvic: Vulva- nl anatomy Vagina- small amt bright red blood, pale side walls Cx- blood through os Uterus- nl size Adn- no masses palp, non tender  Musculoskeletal: Normal range of motion.  Neurological: She is alert and oriented to person, place, and time.  Skin: Skin is warm and dry.  Psychiatric: She has a normal mood and affect. Her behavior is normal.    ED Course  Procedures (including critical care time)  Labs Reviewed  CBC - Abnormal; Notable for the following:    RBC 3.86 (*)     Hemoglobin 5.3 (*)     HCT 20.3 (*)     MCV 52.6 (*)     MCH 13.7 (*)     MCHC 26.1 (*)     RDW 22.3 (*)     All other components within normal limits  POCT PREGNANCY, URINE  SAMPLE TO BLOOD BANK  WET PREP, GENITAL  GC/CHLAMYDIA PROBE AMP   No results found.   No diagnosis found. Hgb 5.3, consulted with Dr Jolayne Panther, to be admitted for transfusion.   MDM

## 2012-08-31 NOTE — Progress Notes (Signed)
CRITICAL VALUE ALERT  Critical value received:  Hgb  Date of notification:  08/31/12  Time of notification:  1845  Critical value read back:yes  Nurse who received alert:  Christianne Dolin, RN  MD notified (1st page):  Eveline Keto, NP  Time of first page:  1845  MD notified (2nd page):  Time of second page:  Responding MD:    Time MD responded:

## 2012-08-31 NOTE — H&P (Signed)
History   CSN: [id number]  Arrival date & time 08/31/12 1617  None  Chief Complaint   Patient presents with   .  Vaginal Bleeding   (Consider location/radiation/quality/duration/timing/severity/associated sxs/prior  treatment)  HPI  Brenda [name] is a 26 y.o. [id number]. LMP 11/1, she has not stopped bleeding since. She changes a pad q 20-30 minutes, has clots up top 6-7 cm, cramping. Past 3-4 days bleeding has decreased. She has headaches and chest pain, feels tired. She has an appt next week with Dr Romine's office, couldn't wait till then. Patient reports for the past 1 1/2 year experiencing monthly menses lasting 2 weeks. This current episode is the longest time she has ever bled. Patient states her bleeding has significantly slowed down today, she only changes 3 pads which were not fully saturated. She took Ortho Tri cyclen July - Oct last year, no hormonal tx since. One partner x 10 yr, neg STD screen with 2010 preg.  Past Medical History   Diagnosis  Date   .  Anemia    .  Asthma     Past Surgical History   Procedure  Date   .  Cesarean section      2010    Family History   Problem  Relation  Age of Onset   .  Stroke  Mother    .  Diabetes  Mother    .  Hypertension  Mother    .  Coronary artery disease  Mother    .  Hypertension  Father     History   Substance Use Topics   .  Smoking status:  Former Smoker     Quit date:  07/28/2004   .  Smokeless tobacco:  Not on file   .  Alcohol Use:  Yes      Comment: occaisionally    OB History    Grav  Para  Term  Preterm  Abortions  TAB  SAB  Ect  Mult  Living    2  1   1  1   1    1      Review of Systems  Constitutional: Positive for fatigue. Negative for fever and chills.  Genitourinary: Positive for vaginal bleeding. Negative for dysuria, urgency, frequency and vaginal discharge.  Allergies   Latex and Oxycodone hcl er  Home Medications   No current outpatient prescriptions on file.  BP 123/62  Pulse 91  Temp 98.4  F (36.9 C) (Oral)  Resp 18  Ht 5\' 9"  (1.753 m)  Wt 271 lb 12.8 oz (123.288 kg)  BMI 40.14 kg/m2  SpO2 100%  LMP 08/18/2012  Physical Exam  Constitutional: She is oriented to person, place, and time. She appears well-developed and well-nourished.  Abdominal: Soft. There is no tenderness.  Genitourinary:  Pelvic: Vulva- nl anatomy Vagina- small amt bright red blood, pale side walls Cx- blood through os Uterus- nl size Adn- no masses palp, non tender  Musculoskeletal: Normal range of motion.  Neurological: She is alert and oriented to person, place, and time.  Skin: Skin is warm and dry.  Psychiatric: She has a normal mood and affect. Her behavior is normal.  ED Course   Procedures (including critical care time)  Labs Reviewed   CBC - Abnormal; Notable for the following:    RBC  3.86 (*)      Hemoglobin  5.3 (*)      HCT  20.3 (*)      MCV  52.6 (*)  MCH  13.7 (*)      MCHC  26.1 (*)      RDW  22.3 (*)      All other components within normal limits   POCT PREGNANCY, URINE   SAMPLE TO BLOOD BANK   WET PREP, GENITAL   GC/CHLAMYDIA PROBE AMP   No results found.  No diagnosis found.  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

## 2012-08-31 NOTE — MAU Note (Signed)
Pt stated she has been having vaginal bleeding for almost 7 weeks. C/o having chest pain and headache at times. Had some vomiting the other day.

## 2012-08-31 NOTE — MAU Note (Signed)
"  I've been on my period for about 7 weeks (since Nov. 1st).  It started out heavy, then it will go lighter and then go back heavy again.  It is associated with chest pain.  When I'm up moving around, it starts off like SOB, then my chest will start hurting and that causes my head to start hurting.  It's like a chain reaction."

## 2012-09-01 DIAGNOSIS — D500 Iron deficiency anemia secondary to blood loss (chronic): Secondary | ICD-10-CM

## 2012-09-01 DIAGNOSIS — N938 Other specified abnormal uterine and vaginal bleeding: Principal | ICD-10-CM

## 2012-09-01 DIAGNOSIS — N949 Unspecified condition associated with female genital organs and menstrual cycle: Secondary | ICD-10-CM

## 2012-09-01 DIAGNOSIS — D649 Anemia, unspecified: Secondary | ICD-10-CM

## 2012-09-01 LAB — CBC
Hemoglobin: 7.9 g/dL — ABNORMAL LOW (ref 12.0–15.0)
MCH: 18.5 pg — ABNORMAL LOW (ref 26.0–34.0)
MCHC: 31.3 g/dL (ref 30.0–36.0)
MCV: 59 fL — ABNORMAL LOW (ref 78.0–100.0)
RBC: 4.27 MIL/uL (ref 3.87–5.11)

## 2012-09-01 MED ORDER — FERROUS SULFATE 325 (65 FE) MG PO TABS: 325.0000 mg | ORAL_TABLET | Freq: Two times a day (BID) | ORAL | Status: DC

## 2012-09-01 MED ORDER — DOCUSATE SODIUM 100 MG PO CAPS: 100.0000 mg | ORAL_CAPSULE | Freq: Two times a day (BID) | ORAL | Status: DC | PRN

## 2012-09-01 NOTE — Discharge Summary (Signed)
Physician Discharge Summary  Patient ID: Brenda [name] MRN: [id number] DOB/AGE: December 08, 1985 26 y.o.  Admit date: 08/31/2012 Discharge date: 09/01/2012  Admission Diagnoses: Anemia secondary to long history of abnormal uterine bleeding (AUB)  Discharge Diagnoses:  Principal Problem:  *Anemia Active Problems:  DUB (dysfunctional uterine bleeding)  Discharged Condition: Stable  Hospital Course: Patient was admitted for anemia secondary to AUB with an admission Hgb of 5.3. She had minimal bleeding during her admission,and was given a Depo Provera injection to help with any further AUB. She also received a blood transfusion with three units of pRBCs, Hgb went to 7.2.Patient had no symptoms of anemia. She was discharged to home with iron therapy and will follow up with Dr. Tresa Res as previously scheduled.  Consults: None  Significant Diagnostic Studies:  Results for orders placed during the hospital encounter of 08/31/12 (from the past 24 hour(s))  CBC     Status: Abnormal   Collection Time   08/31/12  6:19 PM      Component Value Range   WBC 9.6  4.0 - 10.5 K/uL   RBC 3.86 (*) 3.87 - 5.11 MIL/uL   Hemoglobin 5.3 (*) 12.0 - 15.0 g/dL   HCT 11.9 (*) 14.7 - 82.9 %   MCV 52.6 (*) 78.0 - 100.0 fL   MCH 13.7 (*) 26.0 - 34.0 pg   MCHC 26.1 (*) 30.0 - 36.0 g/dL   RDW 56.2 (*) 13.0 - 86.5 %   Platelets 288  150 - 400 K/uL  SAMPLE TO BLOOD BANK     Status: Normal   Collection Time   08/31/12  6:20 PM      Component Value Range   Blood Bank Specimen SAMPLE AVAILABLE FOR TESTING     Sample Expiration 08/31/2012    TYPE AND SCREEN     Status: Normal (Preliminary result)   Collection Time   08/31/12  6:20 PM      Component Value Range   ABO/RH(D) O POS     Antibody Screen NEG     Sample Expiration 09/03/2012     Unit Number [id number]     Blood Component Type RED CELLS,LR     Unit division 00     Status of Unit ISSUED     Transfusion Status OK TO TRANSFUSE     Crossmatch Result  Compatible     Unit Number [id number]     Blood Component Type RED CELLS,LR     Unit division 00     Status of Unit ISSUED     Transfusion Status OK TO TRANSFUSE     Crossmatch Result Compatible     Unit Number [id number]     Blood Component Type RED CELLS,LR     Unit division 00     Status of Unit ISSUED,FINAL     Transfusion Status OK TO TRANSFUSE     Crossmatch Result Compatible    WET PREP, GENITAL     Status: Abnormal   Collection Time   08/31/12  6:35 PM      Component Value Range   Yeast Wet Prep HPF POC NONE SEEN  NONE SEEN   Trich, Wet Prep NONE SEEN  NONE SEEN   Clue Cells Wet Prep HPF POC FEW (*) NONE SEEN   WBC, Wet Prep HPF POC FEW (*) NONE SEEN  PREPARE RBC (CROSSMATCH)     Status: Normal   Collection Time   08/31/12  9:00 PM      Component Value Range  Order Confirmation ORDER PROCESSED BY BLOOD BANK    CBC     Status: Abnormal   Collection Time   09/01/12  7:35 AM      Component Value Range   WBC 7.7  4.0 - 10.5 K/uL   RBC 4.27  3.87 - 5.11 MIL/uL   Hemoglobin 7.9 (*) 12.0 - 15.0 g/dL   HCT 16.1 (*) 09.6 - 04.5 %   MCV 59.0 (*) 78.0 - 100.0 fL   MCH 18.5 (*) 26.0 - 34.0 pg   MCHC 31.3  30.0 - 36.0 g/dL   RDW 40.9 (*) 81.1 - 91.4 %   Platelets 305  150 - 400 K/uL   08/31/2012  TRANSABDOMINAL AND TRANSVAGINAL ULTRASOUND OF PELVIS Clinical Data: Bleeding for 7 weeks   Technique:  Both transabdominal and transvaginal ultrasound examinations of the pelvis were performed. Transabdominal technique was performed for global imaging of the pelvis including uterus, ovaries, adnexal regions, and pelvic cul-de-sac.  It was necessary to proceed with endovaginal exam following the transabdominal exam to visualize the the endometrial complex and ovaries.  Comparison:  Obstetrical ultrasound 08/16/2009  Findings:  Uterus: 8.6 cm length by 4.8 cm AP by 6.6 cm transverse.  Uterus appears septate.  No definite mass identified.  Endometrium: 14 mm thick, abnormally  thickened.  No endometrial fluid  Right ovary:  3.8 x 2.7 x 3.1 cm.  Normal morphology without mass.  Left ovary: 4.2 x 2.1 x 2.4 cm.  Normal morphology without mass.  Other findings: Trace free pelvic fluid.  No adnexal masses.  IMPRESSION: Septate uterus. Trace free pelvic fluid. Abnormally thickened endometrial complex 14 mm diameter, can be seen with hyperplasia, polyps, and tumor. Otherwise negative exam.   Original Report Authenticated By: Ulyses Southward, M.D.    Treatments: Transfusion with 3 units of pRBCs, Depo Provera injection  Discharge Exam: Blood pressure 118/75, pulse 66, temperature 98.3 F (36.8 C), temperature source Oral, resp. rate 15, height 5\' 9"  (1.753 m), weight 271 lb 12.8 oz (123.288 kg), last menstrual period 08/18/2012, SpO2 100.00%. General appearance: alert and no distress Resp: clear to auscultation bilaterally Cardio: regular rate and rhythm GI: soft, non-tender; bowel sounds normal; no masses,  no organomegaly Pelvic: scant blood on pad Extremities: extremities normal, atraumatic, no cyanosis or edema and Homans sign is negative, no sign of DVT  Disposition: 01-Home or Self Care     Medication List     As of 09/01/2012 10:57 AM    TAKE these medications         B-12 PO   Take 1 tablet by mouth daily. Pt does not know strength; gets OTC      docusate sodium 100 MG capsule   Commonly known as: COLACE   Take 1 capsule (100 mg total) by mouth 2 (two) times daily as needed for constipation.      ferrous sulfate 325 (65 FE) MG tablet   Take 1 tablet (325 mg total) by mouth 2 (two) times daily.      multivitamin with minerals Tabs   Take 1 tablet by mouth daily.           Follow-up Information    Follow up with Alison Murray, MD. On 09/05/2012. (As scheduled. Come to [facility] if symptoms worsen)    Contact information:   [address] [place] [address] [phone]          Signed: Tereso Newcomer 09/01/2012, 10:57  AM

## 2012-09-02 LAB — TYPE AND SCREEN
Antibody Screen: NEGATIVE
Unit division: 0
Unit division: 0

## 2012-09-02 LAB — GC/CHLAMYDIA PROBE AMP
CT Probe RNA: NEGATIVE
GC Probe RNA: NEGATIVE

## 2012-09-03 NOTE — [facility] Provider Note (Signed)
Attestation of Attending Supervision of Advanced Practitioner (CNM/NP): Evaluation and management procedures were performed by the Advanced Practitioner under my supervision and collaboration.  I have reviewed the Advanced Practitioner's note and chart, and I agree with the management and plan.  Yanet Balliet 09/03/2012 6:10 PM   

## 2012-10-15 ENCOUNTER — Other Ambulatory Visit: Payer: Self-pay | Admitting: Obstetrics & Gynecology

## 2012-10-15 DIAGNOSIS — N390 Urinary tract infection, site not specified: Secondary | ICD-10-CM

## 2012-10-24 ENCOUNTER — Other Ambulatory Visit: Payer: Self-pay | Admitting: Obstetrics & Gynecology

## 2012-10-24 DIAGNOSIS — N390 Urinary tract infection, site not specified: Secondary | ICD-10-CM

## 2012-10-25 ENCOUNTER — Ambulatory Visit (HOSPITAL_COMMUNITY): Admission: RE | Admit: 2012-10-25 | Payer: BC Managed Care – PPO | Source: Ambulatory Visit

## 2012-10-31 ENCOUNTER — Ambulatory Visit (HOSPITAL_COMMUNITY): Admission: RE | Admit: 2012-10-31 | Payer: BC Managed Care – PPO | Source: Ambulatory Visit

## 2012-10-31 ENCOUNTER — Ambulatory Visit (HOSPITAL_COMMUNITY): Payer: BC Managed Care – PPO

## 2012-11-07 ENCOUNTER — Other Ambulatory Visit (HOSPITAL_COMMUNITY): Payer: Self-pay | Admitting: Obstetrics & Gynecology

## 2012-11-07 ENCOUNTER — Ambulatory Visit (HOSPITAL_COMMUNITY)
Admission: RE | Admit: 2012-11-07 | Discharge: 2012-11-07 | Disposition: A | Discharge status: Home / Self Care | Payer: BC Managed Care – PPO | Source: Ambulatory Visit | Attending: Obstetrics & Gynecology | Admitting: Obstetrics & Gynecology

## 2012-11-07 DIAGNOSIS — N390 Urinary tract infection, site not specified: Secondary | ICD-10-CM

## 2012-11-07 DIAGNOSIS — N854 Malposition of uterus: Secondary | ICD-10-CM | POA: Insufficient documentation

## 2012-12-19 ENCOUNTER — Encounter: Payer: Self-pay | Admitting: Obstetrics & Gynecology

## 2012-12-19 ENCOUNTER — Ambulatory Visit (INDEPENDENT_AMBULATORY_CARE_PROVIDER_SITE_OTHER): Payer: BC Managed Care – PPO | Admitting: Obstetrics & Gynecology

## 2012-12-19 VITALS — BP 124/86 | HR 75 | Temp 97.7°F | Ht 69.0 in | Wt 270.0 lb

## 2012-12-19 DIAGNOSIS — N926 Irregular menstruation, unspecified: Secondary | ICD-10-CM

## 2012-12-19 MED ORDER — PRENATAL PLUS 27-1 MG PO TABS: 1.0000 | ORAL_TABLET | Freq: Every day | ORAL | Status: DC

## 2012-12-19 MED ORDER — NORETHINDRONE ACET-ETHINYL EST 1-20 MG-MCG PO TABS: 1.0000 | ORAL_TABLET | Freq: Every day | ORAL | Status: DC

## 2012-12-19 NOTE — Progress Notes (Signed)
.   Subjective:     Brenda [name] is a 27 y.o. female here for a follow up exam and ultrasound results (done 11/07/12).  Current complaints are abnormal bleeding/spotting that started again on 11/25/12.  She also needs a repeat pap smear.  Patient is currently taking daily iron.  Personal health questionnaire reviewed: no.   Gynecologic History No LMP recorded. Contraception: none Last Pap:09/2012  Needs to be repeated. Last mammogram: n/a   Obstetric History OB History   Grav Para Term Preterm Abortions TAB SAB Ect Mult Living   2 1  1 1  1   1      # Outc Date GA Lbr Len/2nd Wgt Sex Del Anes PTL Lv   1 SAB 2003           2 PRE 11/10 [redacted]w[redacted]d   F LTCS Spinal Yes Yes   Comments: transverse       The following portions of the patient's history were reviewed and updated as appropriate: allergies, current medications, past family history, past medical history, past social history, past surgical history and problem list.  Review of Systems Pertinent items are noted in HPI.    Objective:    Pelvic: cervix normal in appearance    Assessment:   AUB--likely O, DDX simple obesity, PCOS  Mullerian dysgenesis unlikely Plan:   Labs COCP

## 2012-12-19 NOTE — Patient Instructions (Signed)

## 2012-12-20 LAB — TESTOSTERONE, FREE, TOTAL, SHBG
Sex Hormone Binding: 42 nmol/L (ref 18–114)
Testosterone, Free: 5.6 pg/mL (ref 0.6–6.8)
Testosterone-% Free: 1.5 % (ref 0.4–2.4)
Testosterone: 36 ng/dL (ref 10–70)

## 2012-12-20 LAB — PROLACTIN: Prolactin: 19 ng/mL

## 2012-12-21 LAB — PAP IG, CT-NG, RFX HPV ASCU: GC Probe Amp: NEGATIVE

## 2013-03-03 ENCOUNTER — Ambulatory Visit: Payer: BC Managed Care – PPO | Admitting: Obstetrics & Gynecology

## 2013-03-06 ENCOUNTER — Ambulatory Visit (INDEPENDENT_AMBULATORY_CARE_PROVIDER_SITE_OTHER): Payer: BC Managed Care – PPO | Admitting: Obstetrics & Gynecology

## 2013-03-06 ENCOUNTER — Encounter: Payer: Self-pay | Admitting: Obstetrics & Gynecology

## 2013-03-06 VITALS — BP 125/85 | HR 77 | Wt 274.0 lb

## 2013-03-06 DIAGNOSIS — N938 Other specified abnormal uterine and vaginal bleeding: Secondary | ICD-10-CM

## 2013-03-06 DIAGNOSIS — N949 Unspecified condition associated with female genital organs and menstrual cycle: Secondary | ICD-10-CM

## 2013-03-06 NOTE — Patient Instructions (Addendum)
Patient information: Common breast problems (The Basics)View in SpanishWritten by the doctors and editors at [employer]To[employer]  What kinds of problems can women have with their breasts? - Women can have different kinds of problems with their breasts. The important thing to know is that for most but not all women, most breast related problems are not caused by breast cancer. Even so, if you develop any problem with your breasts, see a doctor or nurse to have it checked out.  Some common breast problems include: ?Breast lumpiness ?Single breast lump (which doctors call a "mass") ?Breast pain  ?Breast tenderness  ?Nipple discharge (meaning fluid is leaks from the nipples, such as clear, white, yellow, green, or red fluid) ?Nipple inversion (meaning the nipples point inward instead of outward) and that is new and has occurred in just one breast ?Changes in the skin of the breast, such as redness or puckering These problems can happen in women of all ages. If you develop any of these problems, see your doctor or nurse. Breast problems are not usually an emergency, but you should get checked out as soon as possible. If there is something serious going on, it's important to find out quickly. Your doctor or nurse might be able to tell what's happening just by doing an exam. If not, he or she can order some tests, or send you to a specialist. Which tests might I need? - Your doctor or nurse will decide which tests you need based on your individual situation. Common tests used to evaluate breast problems are listed below:  ?Breast ultrasound - This is an imaging test that uses sound waves to create pictures of the inside of your breast. Among other things, it can show if a lump is solid or filled with fluid. ?Breast biopsy - During a biopsy, a doctor takes 1 or more tiny samples of suspicious breast tissue using a needle. The samples then go to the lab to be checked for cancer or other problems.  ?Mammogram - Mammograms  are special X-rays of the breast. They can help doctors find breast cancer. What could be causing the problem? - The breast symptoms listed above could be caused by a number of problems, most of which are not serious. For example, normal hormone changes in a woman's monthly cycle can sometimes cause breast pain or even lumps that turn out to be nothing. Still, new breast symptoms can be a sign of cancer, so it's important to see a doctor if you develop any symptom.

## 2013-03-06 NOTE — Progress Notes (Signed)
Subjective:     Brenda [name] is a 27 y.o. female here for a routine exam.  Current complaints: follow up. Pt states the last time she was in office she was given birth control pills to help her menstrual cycles. Pt states this last cycle was 7 days and the flow is not as heavy. Pt states she is having pain in her right breast. Pt denies any lumps. Personal health questionnaire reviewed: yes.   Gynecologic History Patient's last menstrual period was 02/18/2013. Contraception: OCP (estrogen/progesterone) Last Pap: 08/2012. Results were: normal Last mammogram: n/a.  Obstetric History OB History   Grav Para Term Preterm Abortions TAB SAB Ect Mult Living   2 1  1 1  1   1      # Outc Date GA Lbr Len/2nd Wgt Sex Del Anes PTL Lv   1 SAB 2003           2 PRE 11/10 [redacted]w[redacted]d   F LTCS Spinal Yes Yes   Comments: transverse       The following portions of the patient's history were reviewed and updated as appropriate: allergies, current medications, past family history, past medical history, past social history, past surgical history and problem list.  Review of Systems Pertinent items are noted in HPI.    Objective:     No exam today     Assessment:    AUB w/response to medical management  Plan:   Continue COCP  Return prn

## 2013-03-09 ENCOUNTER — Encounter: Payer: Self-pay | Admitting: Obstetrics & Gynecology

## 2013-06-05 ENCOUNTER — Ambulatory Visit (INDEPENDENT_AMBULATORY_CARE_PROVIDER_SITE_OTHER): Payer: BC Managed Care – PPO | Admitting: Obstetrics & Gynecology

## 2013-06-05 ENCOUNTER — Encounter: Payer: Self-pay | Admitting: Obstetrics & Gynecology

## 2013-06-05 VITALS — BP 145/102 | HR 63 | Temp 98.8°F | Ht 69.0 in | Wt 274.0 lb

## 2013-06-05 DIAGNOSIS — N644 Mastodynia: Secondary | ICD-10-CM

## 2013-06-05 LAB — POCT URINE PREGNANCY: Preg Test, Ur: NEGATIVE

## 2013-06-05 MED ORDER — NORETHINDRONE 0.35 MG PO TABS: 1.0000 | ORAL_TABLET | Freq: Every day | ORAL | Status: DC

## 2013-06-05 NOTE — Progress Notes (Signed)
Subjective:     Brenda [name] is a 27 y.o. female here for a routine exam.  Current complaints: Patient c/o an indention on L breast. Patient reports lump has been present for 8 days- is painful. Regarding BP- patient states she does have frequent headaches and blurred vision. Patient ran out of her birth control pills on Monday. Personal health questionnaire reviewed: no.   Gynecologic History Patient's last menstrual period was 05/29/2013. Contraception: OCP (estrogen/progesterone) Last Pap: 02/2013. Results were: normal Last mammogram: never  Obstetric History OB History  Gravida Para Term Preterm AB SAB TAB Ectopic Multiple Living  2 1  1 1 1    1     # Outcome Date GA Lbr Len/2nd Weight Sex Delivery Anes PTL Lv  2 PRE 08/17/09 [redacted]w[redacted]d   F LTCS Spinal Y Y     Comments: transverse  1 SAB 2003               The following portions of the patient's history were reviewed and updated as appropriate: allergies, current medications, past family history, past medical history, past social history, past surgical history and problem list.  Review of Systems Pertinent items are noted in HPI.    Objective:       Assessment:    ?palpable breast abnormality--?palpable duct  Plan:    Return in 2 mths

## 2013-06-06 ENCOUNTER — Encounter: Payer: Self-pay | Admitting: Obstetrics & Gynecology

## 2013-06-06 NOTE — Progress Notes (Signed)
  Subjective:    Patient ID: Brenda [name], female    DOB: [birth year], 27 y.o.   MRN: [id number]  Gynecologic Exam      Review of Systems     Objective:   Physical Exam  Pulmonary/Chest:            Assessment & Plan:

## 2013-06-26 ENCOUNTER — Ambulatory Visit: Payer: BC Managed Care – PPO | Admitting: Obstetrics & Gynecology

## 2013-08-08 ENCOUNTER — Other Ambulatory Visit: Payer: Self-pay | Admitting: Obstetrics and Gynecology

## 2013-08-08 DIAGNOSIS — R234 Changes in skin texture: Secondary | ICD-10-CM

## 2013-08-08 DIAGNOSIS — N644 Mastodynia: Secondary | ICD-10-CM

## 2013-08-13 ENCOUNTER — Inpatient Hospital Stay: Admission: RE | Admit: 2013-08-13 | Payer: BC Managed Care – PPO | Source: Ambulatory Visit

## 2013-09-04 ENCOUNTER — Ambulatory Visit: Payer: BC Managed Care – PPO | Admitting: Obstetrics & Gynecology

## 2014-04-24 ENCOUNTER — Encounter (HOSPITAL_COMMUNITY): Payer: Self-pay | Admitting: Emergency Medicine

## 2014-04-24 ENCOUNTER — Inpatient Hospital Stay (HOSPITAL_COMMUNITY)
Admission: EM | Admit: 2014-04-24 | Discharge: 2014-04-28 | DRG: 872 | Disposition: A | Discharge status: Home / Self Care | Payer: Self-pay | Attending: Internal Medicine | Admitting: Internal Medicine

## 2014-04-24 ENCOUNTER — Emergency Department (HOSPITAL_COMMUNITY): Payer: Self-pay

## 2014-04-24 DIAGNOSIS — D509 Iron deficiency anemia, unspecified: Secondary | ICD-10-CM | POA: Diagnosis present

## 2014-04-24 DIAGNOSIS — Z833 Family history of diabetes mellitus: Secondary | ICD-10-CM

## 2014-04-24 DIAGNOSIS — L03119 Cellulitis of unspecified part of limb: Secondary | ICD-10-CM

## 2014-04-24 DIAGNOSIS — A419 Sepsis, unspecified organism: Principal | ICD-10-CM | POA: Diagnosis present

## 2014-04-24 DIAGNOSIS — Z885 Allergy status to narcotic agent status: Secondary | ICD-10-CM

## 2014-04-24 DIAGNOSIS — R6510 Systemic inflammatory response syndrome (SIRS) of non-infectious origin without acute organ dysfunction: Secondary | ICD-10-CM

## 2014-04-24 DIAGNOSIS — L03115 Cellulitis of right lower limb: Secondary | ICD-10-CM

## 2014-04-24 DIAGNOSIS — Z823 Family history of stroke: Secondary | ICD-10-CM

## 2014-04-24 DIAGNOSIS — L02419 Cutaneous abscess of limb, unspecified: Secondary | ICD-10-CM | POA: Diagnosis present

## 2014-04-24 DIAGNOSIS — Z8249 Family history of ischemic heart disease and other diseases of the circulatory system: Secondary | ICD-10-CM

## 2014-04-24 DIAGNOSIS — Z91040 Latex allergy status: Secondary | ICD-10-CM

## 2014-04-24 LAB — CBC WITH DIFFERENTIAL/PLATELET
Basophils Absolute: 0 10*3/uL (ref 0.0–0.1)
Basophils Relative: 0 % (ref 0–1)
EOS ABS: 0 10*3/uL (ref 0.0–0.7)
Eosinophils Relative: 0 % (ref 0–5)
HCT: 32.8 % — ABNORMAL LOW (ref 36.0–46.0)
Hemoglobin: 10.1 g/dL — ABNORMAL LOW (ref 12.0–15.0)
Lymphocytes Relative: 5 % — ABNORMAL LOW (ref 12–46)
Lymphs Abs: 1.5 10*3/uL (ref 0.7–4.0)
MCH: 17.7 pg — AB (ref 26.0–34.0)
MCHC: 30.8 g/dL (ref 30.0–36.0)
MCV: 57.4 fL — ABNORMAL LOW (ref 78.0–100.0)
Monocytes Absolute: 1.8 10*3/uL — ABNORMAL HIGH (ref 0.1–1.0)
Monocytes Relative: 6 % (ref 3–12)
Neutro Abs: 26.8 10*3/uL — ABNORMAL HIGH (ref 1.7–7.7)
Neutrophils Relative %: 89 % — ABNORMAL HIGH (ref 43–77)
PLATELETS: 322 10*3/uL (ref 150–400)
RBC: 5.71 MIL/uL — AB (ref 3.87–5.11)
RDW: 20.1 % — AB (ref 11.5–15.5)
WBC: 30.1 10*3/uL — AB (ref 4.0–10.5)

## 2014-04-24 LAB — URINALYSIS, ROUTINE W REFLEX MICROSCOPIC
BILIRUBIN URINE: NEGATIVE
Glucose, UA: NEGATIVE mg/dL
Hgb urine dipstick: NEGATIVE
KETONES UR: NEGATIVE mg/dL
Leukocytes, UA: NEGATIVE
NITRITE: NEGATIVE
PROTEIN: NEGATIVE mg/dL
Specific Gravity, Urine: 1.013 (ref 1.005–1.030)
UROBILINOGEN UA: 0.2 mg/dL (ref 0.0–1.0)
pH: 7 (ref 5.0–8.0)

## 2014-04-24 LAB — COMPREHENSIVE METABOLIC PANEL
ALT: 15 U/L (ref 0–35)
ANION GAP: 17 — AB (ref 5–15)
AST: 24 U/L (ref 0–37)
Albumin: 3.9 g/dL (ref 3.5–5.2)
Alkaline Phosphatase: 75 U/L (ref 39–117)
BILIRUBIN TOTAL: 0.4 mg/dL (ref 0.3–1.2)
BUN: 5 mg/dL — AB (ref 6–23)
CHLORIDE: 98 meq/L (ref 96–112)
CO2: 21 mEq/L (ref 19–32)
Calcium: 9.1 mg/dL (ref 8.4–10.5)
Creatinine, Ser: 0.63 mg/dL (ref 0.50–1.10)
GFR calc non Af Amer: >90 mL/min (ref >90)
GLUCOSE: 118 mg/dL — AB (ref 70–99)
Potassium: 4.4 mEq/L (ref 3.7–5.3)
Sodium: 136 mEq/L — ABNORMAL LOW (ref 137–147)
Total Protein: 8 g/dL (ref 6.0–8.3)

## 2014-04-24 LAB — I-STAT CG4 LACTIC ACID, ED
LACTIC ACID, VENOUS: 0.92 mmol/L (ref 0.5–2.2)
Lactic Acid, Venous: 3.25 mmol/L — ABNORMAL HIGH (ref 0.5–2.2)

## 2014-04-24 LAB — PROTEIN AND GLUCOSE, CSF
Glucose, CSF: 84 mg/dL — ABNORMAL HIGH (ref 43–76)
Total  Protein, CSF: 21 mg/dL (ref 15–45)

## 2014-04-24 LAB — POC URINE PREG, ED: Preg Test, Ur: NEGATIVE

## 2014-04-24 MED ORDER — ONDANSETRON HCL 4 MG/2ML IJ SOLN
4.0000 mg | INTRAMUSCULAR | Status: AC
  Administered 2014-04-24: 4 mg via INTRAVENOUS
  Filled 2014-04-24: qty 2

## 2014-04-24 MED ORDER — DEXTROSE 5 % IV SOLN
2.0000 g | Freq: Once | INTRAVENOUS | Status: AC
  Administered 2014-04-24: 2 g via INTRAVENOUS
  Filled 2014-04-24: qty 2

## 2014-04-24 MED ORDER — VANCOMYCIN HCL 10 G IV SOLR
2000.0000 mg | Freq: Once | INTRAVENOUS | Status: AC
  Administered 2014-04-25: 2000 mg via INTRAVENOUS
  Filled 2014-04-24: qty 2000

## 2014-04-24 MED ORDER — LIDOCAINE-EPINEPHRINE 1 %-1:100000 IJ SOLN: 20.0000 mL | Freq: Once | INTRAMUSCULAR | Status: DC

## 2014-04-24 MED ORDER — LIDOCAINE-EPINEPHRINE 2 %-1:100000 IJ SOLN: 20.0000 mL | Freq: Once | INTRAMUSCULAR | Status: DC

## 2014-04-24 MED ORDER — VANCOMYCIN HCL IN DEXTROSE 1-5 GM/200ML-% IV SOLN
1000.0000 mg | Freq: Three times a day (TID) | INTRAVENOUS | Status: DC
  Administered 2014-04-25 – 2014-04-26 (×3): 1000 mg via INTRAVENOUS
  Filled 2014-04-24 (×5): qty 200

## 2014-04-24 MED ORDER — ACETAMINOPHEN 325 MG PO TABS
650.0000 mg | ORAL_TABLET | Freq: Four times a day (QID) | ORAL | Status: DC | PRN
  Administered 2014-04-24: 650 mg via ORAL
  Filled 2014-04-24: qty 2

## 2014-04-24 MED ORDER — SODIUM CHLORIDE 0.9 % IV SOLN
INTRAVENOUS | Status: DC
  Administered 2014-04-25: 125 mL/h via INTRAVENOUS
  Administered 2014-04-25: 14:00:00 via INTRAVENOUS
  Administered 2014-04-25: 125 mL/h via INTRAVENOUS
  Administered 2014-04-27: 03:00:00 via INTRAVENOUS

## 2014-04-24 MED ORDER — IBUPROFEN 800 MG PO TABS
800.0000 mg | ORAL_TABLET | Freq: Once | ORAL | Status: AC
  Administered 2014-04-24: 800 mg via ORAL
  Filled 2014-04-24: qty 1

## 2014-04-24 MED ORDER — SODIUM CHLORIDE 0.9 % IV BOLUS (SEPSIS)
2000.0000 mL | Freq: Once | INTRAVENOUS | Status: AC
  Administered 2014-04-24: 2000 mL via INTRAVENOUS

## 2014-04-24 MED ORDER — HEPARIN SODIUM (PORCINE) 5000 UNIT/ML IJ SOLN
5000.0000 [IU] | Freq: Three times a day (TID) | INTRAMUSCULAR | Status: DC
  Administered 2014-04-25 – 2014-04-28 (×11): 5000 [IU] via SUBCUTANEOUS
  Filled 2014-04-24 (×14): qty 1

## 2014-04-24 MED ORDER — LORAZEPAM 2 MG/ML IJ SOLN
1.0000 mg | Freq: Once | INTRAMUSCULAR | Status: AC
  Administered 2014-04-24: 1 mg via INTRAVENOUS
  Filled 2014-04-24: qty 1

## 2014-04-24 MED ORDER — ONDANSETRON HCL 4 MG/2ML IJ SOLN: 4.0000 mg | Freq: Four times a day (QID) | INTRAMUSCULAR | Status: DC | PRN

## 2014-04-24 MED ORDER — ONDANSETRON HCL 4 MG PO TABS: 4.0000 mg | ORAL_TABLET | Freq: Four times a day (QID) | ORAL | Status: DC | PRN

## 2014-04-24 MED ORDER — SODIUM CHLORIDE 0.9 % IJ SOLN
3.0000 mL | Freq: Two times a day (BID) | INTRAMUSCULAR | Status: DC
  Administered 2014-04-25 – 2014-04-27 (×7): 3 mL via INTRAVENOUS

## 2014-04-24 NOTE — ED Notes (Signed)
LP tray at bedside

## 2014-04-24 NOTE — ED Notes (Signed)
Pt back from Fluoroscopy

## 2014-04-24 NOTE — ED Notes (Signed)
Pt states when she gets up she gets sob.  Pt with frequent urination. 2nd IV started and 2nd liter of NS infusing

## 2014-04-24 NOTE — ED Notes (Signed)
Pt heart rate decreases when she lies down

## 2014-04-24 NOTE — ED Notes (Signed)
Jarrett Soho PA aware of elevated HR and sob.  Pt states she feels anxious

## 2014-04-24 NOTE — ED Notes (Signed)
Jarrett Soho, PA at bedside speaking with pt

## 2014-04-24 NOTE — H&P (Signed)
[facility] History and Physical  Brenda [name]:[id number] DOB: 06/25/1986 DOA: 04/24/2014  Referring physician: EDP PCP: PROVIDER NOT IN SYSTEM   Chief Complaint: Fever   HPI: Brenda [name] is a 28 y.o. female previously healthy other than anemia, presents to the ED with fever, weakness, headache, and right thigh pain.  Symptoms onset at 7am.  Movement does not make the thigh pain any worse.  Quality of the thigh pain is described as cramping.  Patient has had nausea and vomiting once today and 1 episode of vomiting yesterday.  Emesis is NBNB, no diarrhea.  BM without melena or blood.  No neck pain nor neck stiffness.  Headache is throbbing, better when lying down and worse when moving around.  Fever as high as 103 noted in ED, tachycardic to 150s which came down to 110s by time of admission.  WBC 30k, tachypnic to the upper 20s.  CXR negative, UA negative, patient given 2gm rocephin in ED empirically for possible bacterial meningitis and LP obtained results are pending.  Review of Systems: Systems reviewed.  As above, otherwise negative  Past Medical History  Diagnosis Date  . Anemia   . Asthma   . Abnormal Pap smear    Past Surgical History  Procedure Laterality Date  . Cesarean section      2010   Social History:  reports that she has never smoked. She does not have any smokeless tobacco history on file. She reports that she drinks alcohol. She reports that she does not use illicit drugs.  Allergies  Allergen Reactions  . Oxycodone Hcl Hives  . Latex Itching    Family History  Problem Relation Age of Onset  . Stroke Mother   . Diabetes Mother   . Hypertension Mother   . Coronary artery disease Mother   . Hypertension Father      Prior to Admission medications   Medication Sig Start Date End Date Taking? Authorizing Provider  Biotin 5000 MCG TABS Take 5,000 Units by mouth daily.   Yes Historical Provider, MD  Cyanocobalamin (B-12 PO) Take 1 tablet by mouth  daily. Pt does not know strength; gets OTC   Yes Historical Provider, MD   Physical Exam: Filed Vitals:   04/24/14 2304  BP:   Pulse: 88  Temp:   Resp: 27    BP 133/63  Pulse 88  Temp(Src) 103 F (39.4 C) (Oral)  Resp 27  SpO2 99%  LMP 01/22/2014  General Appearance:    Alert, oriented, no distress, appears stated age  Head:    Normocephalic, atraumatic  Eyes:    PERRL, EOMI, sclera non-icteric        Nose:   Nares without drainage or epistaxis. Mucosa, turbinates normal  Throat:   Moist mucous membranes. Oropharynx without erythema or exudate.  Neck:   Supple. No carotid bruits.  No thyromegaly.  No lymphadenopathy.   Back:     No CVA tenderness, no spinal tenderness  Lungs:     Clear to auscultation bilaterally, without wheezes, rhonchi or rales  Chest wall:    No tenderness to palpitation  Heart:    Regular rate and rhythm without murmurs, gallops, rubs  Abdomen:     Soft, non-tender, nondistended, normal bowel sounds, no organomegaly  Genitalia:    deferred  Rectal:    deferred  Extremities:   No clubbing, cyanosis or edema.  No joint erythema, full ROM of LLE without pain.  No tenderness.  Pulses:  2+ and symmetric all extremities  Skin:   Skin color, texture, turgor normal, no rashes or lesions  Lymph nodes:   Cervical, supraclavicular, and axillary nodes normal  Neurologic:   CNII-XII intact. Normal strength, sensation and reflexes      throughout    Labs on Admission:  Basic Metabolic Panel:  Recent Labs Lab 04/24/14 1525  NA 136*  K 4.4  CL 98  CO2 21  GLUCOSE 118*  BUN 5*  CREATININE 0.63  CALCIUM 9.1   Liver Function Tests:  Recent Labs Lab 04/24/14 1525  AST 24  ALT 15  ALKPHOS 75  BILITOT 0.4  PROT 8.0  ALBUMIN 3.9   No results found for this basename: LIPASE, AMYLASE,  in the last 168 hours No results found for this basename: AMMONIA,  in the last 168 hours CBC:  Recent Labs Lab 04/24/14 1525  WBC 30.1*  NEUTROABS 26.8*  HGB  10.1*  HCT 32.8*  MCV 57.4*  PLT 322   Cardiac Enzymes: No results found for this basename: CKTOTAL, CKMB, CKMBINDEX, TROPONINI,  in the last 168 hours  BNP (last 3 results) No results found for this basename: PROBNP,  in the last 8760 hours CBG: No results found for this basename: GLUCAP,  in the last 168 hours  Radiological Exams on Admission: Dg Chest 2 View  04/24/2014   CLINICAL DATA:  Fever, headache, weakness  EXAM: CHEST  2 VIEW  COMPARISON:  07/29/2011  FINDINGS: Cardiomediastinal silhouette is stable. No acute infiltrate or pleural effusion. No pulmonary edema. Bony thorax is unremarkable.  IMPRESSION: No active cardiopulmonary disease.   Electronically Signed   By: Lahoma Crocker M.D.   On: 04/24/2014 16:24   Ct Head Wo Contrast  04/24/2014   CLINICAL DATA:  Fever, weakness, headache.  EXAM: CT HEAD WITHOUT CONTRAST  TECHNIQUE: Contiguous axial images were obtained from the base of the skull through the vertex without contrast.  COMPARISON:  None  FINDINGS: Normal appearance of the intracranial structures. No evidence for acute hemorrhage, mass lesion, midline shift, hydrocephalus or large infarct. Symmetric appearing temporal lobes without areas of low attenuation. No acute bony abnormality. The visualized sinuses are free of acute fluid. Minor retention cyst in the dependent LEFT maxillary sinus. No mastoid or middle ear fluid. Negative orbits.  IMPRESSION: No acute intracranial abnormality.  Negative exam.   Electronically Signed   By: Rolla Flatten M.D.   On: 04/24/2014 21:58   Dg Fluoro Guide Lumbar Puncture  04/24/2014   CLINICAL DATA:  Headache, fever, concern for meningitis  EXAM: DIAGNOSTIC LUMBAR PUNCTURE UNDER FLUOROSCOPIC GUIDANCE  FLUOROSCOPY TIME:  1 min 46 seconds  PROCEDURE: Informed consent was obtained from the patient prior to the procedure, including potential complications of headache, allergy, and pain. With the patient prone, the lower back was prepped with Betadine. 1%  Lidocaine was used for local anesthesia. Lumbar puncture was performed at the L4-5 level using a 20 gauge needle with return of clear CSF . 64ml of CSF were obtained for laboratory studies. The patient tolerated the procedure well and there were no apparent complications.  IMPRESSION: Successful lumbar puncture under fluoroscopic guidance. Due to equipment failure, images could not be saved.   Electronically Signed   By: Skipper Cliche M.D.   On: 04/24/2014 23:11    EKG: Independently reviewed.  Assessment/Plan Active Problems:   SIRS (systemic inflammatory response syndrome)   1. SIRS - unclear source of infection: 1. Empiric treatment for bacterial meningitis until  LP results come back, got 2gm rocephin in ED, have added vancomycin per pharm consult. 2. Tylenol for fever prn 3. Blood, CSF, cultures pending 4. Tele monitor for tachycardia 5. Lactic acid has improved with hydration 6. UA was negative 7. CXR was negative 8. No back or flank pain (other than that associated with LP) to suggest kidney stone. 9. LFTs normal, no abdominal pain to suggest abdominal source beyond the 2 episodes of vomiting.  If LP results are negative then CT abd/pelvis would likely be the next step in diagnosis.    Code Status: Full  Family Communication: No family in room Disposition Plan: Admit to inpatient   Time spent: 44 min  Fayne Mcguffee M. [facility] Pager [phone]  If 7AM-7PM, please contact the day team taking care of the patient [email or web address] Password [facility] 04/24/2014, 11:46 PM

## 2014-04-24 NOTE — ED Notes (Signed)
Pt reports right posterior thigh pain that started today and distal pulses present and palpable.  Pt reports intermittent pain to chest that started after vomiting

## 2014-04-24 NOTE — Progress Notes (Signed)
ANTIBIOTIC CONSULT NOTE - INITIAL  Pharmacy Consult for Vancomycin  Indication: rule out sepsis  Allergies  Allergen Reactions  . Oxycodone Hcl Hives  . Latex Itching    Patient Measurements: ~120 kg  Vital Signs: Temp: 103 F (39.4 C) (08/07 1945) Temp src: Oral (08/07 1945) BP: 133/63 mmHg (08/07 2115) Pulse Rate: 88 (08/07 2304)  Labs:  Recent Labs  04/24/14 1525  WBC 30.1*  HGB 10.1*  PLT 322  CREATININE 0.63   Medical History: Past Medical History  Diagnosis Date  . Anemia   . Asthma   . Abnormal Pap smear    Assessment: 28 y/o F here with HA, fever, thigh pain, n/v, LP done, marked leukocytosis, lactic acid 3.25 on admit, renal function ok, other labs as above.   Goal of Therapy:  Vancomycin trough level 15-20 mcg/ml  Plan:  -Vancomycin 2000 mg IV x 1, then 1000 mg IV q8h -Trend WBC, temp, renal function  -F/u gram negative coverage, got CTX x 1 -Drug levels as indicated   Brenda [name] 04/24/2014,11:48 PM

## 2014-04-24 NOTE — ED Notes (Signed)
Pt placed back on continuous pulse oximetry and blood pressure cuff 

## 2014-04-24 NOTE — ED Notes (Signed)
Presents with headache, fever of 101.2, right thigh pain, nausea, vomiting and left sided chest pain worse with movement and deep breathing began this am. Endorses all over weakness. Denies cough. Denies dysuria. Denies light sensitivity, denies sound sensitivity. Breath sounds clear bilaterally.

## 2014-04-24 NOTE — ED Notes (Signed)
Result of i-stat CG4+ Lactic Acid given to Dr. Darl Householder

## 2014-04-24 NOTE — ED Provider Notes (Signed)
CSN: [id number]     Arrival date & time 04/24/14  1509 History   First MD Initiated Contact with Patient 04/24/14 1557     Chief Complaint  Patient presents with  . Fever  . Weakness  . Headache  . Leg Pain     (Consider location/radiation/quality/duration/timing/severity/associated sxs/prior Treatment) The history is provided by the patient and medical records. No language interpreter was used.    Brenda [name] is a 28 y.o. female  with a hx of anemia, asthma presents to the Emergency Department complaining of gradual, persistent, progressively worsening headache, fever (TMAX 101.2), nausea, vomiting (x1) and associated left side pain onset 7am. Associated symptoms include generalized fatigue and weakness.  Pt also c/o right posterior thigh pain described as cramping.  Pt reports 1 episode of emesis yesterday as well. She reports emesis was NBNB and there has been no diarrhea.  BM without melena or hematochezia.  Nothing makes it better and nothing makes it worse.  Pt denies neck pain, neck stiffness, cough, photophobia, phonophobia.  Pt describes her headache as throbbing, significantly better with laying flat and worse with moving around.  She also reports SOB with movement.     Past Medical History  Diagnosis Date  . Anemia   . Asthma   . Abnormal Pap smear    Past Surgical History  Procedure Laterality Date  . Cesarean section      2010   Family History  Problem Relation Age of Onset  . Stroke Mother   . Diabetes Mother   . Hypertension Mother   . Coronary artery disease Mother   . Hypertension Father    History  Substance Use Topics  . Smoking status: Never Smoker   . Smokeless tobacco: Not on file  . Alcohol Use: Yes     Comment: occaisionally   OB History   Grav Para Term Preterm Abortions TAB SAB Ect Mult Living   2 1  1 1  1   1      Review of Systems  Constitutional: Positive for fever and fatigue. Negative for diaphoresis, appetite change and unexpected  weight change.  HENT: Negative for mouth sores.   Eyes: Negative for visual disturbance.  Respiratory: Negative for cough, chest tightness, shortness of breath and wheezing.   Cardiovascular: Negative for chest pain.  Gastrointestinal: Negative for nausea, vomiting, abdominal pain, diarrhea and constipation.  Endocrine: Negative for polydipsia, polyphagia and polyuria.  Genitourinary: Negative for dysuria, urgency, frequency and hematuria.  Musculoskeletal: Positive for arthralgias (right leg). Negative for back pain and neck stiffness.  Skin: Negative for rash.  Allergic/Immunologic: Negative for immunocompromised state.  Neurological: Positive for weakness, light-headedness and headaches. Negative for syncope.  Hematological: Does not bruise/bleed easily.  Psychiatric/Behavioral: Negative for sleep disturbance. The patient is not nervous/anxious.       Allergies  Oxycodone hcl and Latex  Home Medications   Prior to Admission medications   Medication Sig Start Date End Date Taking? Authorizing Provider  Biotin 5000 MCG TABS Take 5,000 Units by mouth daily.   Yes Historical Provider, MD  Cyanocobalamin (B-12 PO) Take 1 tablet by mouth daily. Pt does not know strength; gets OTC   Yes Historical Provider, MD   BP 146/89  Pulse 96  Temp(Src) 99.1 F (37.3 C) (Oral)  Resp 18  Ht 5\' 9"  (1.753 m)  Wt 276 lb (125.193 kg)  BMI 40.74 kg/m2  SpO2 99%  LMP 01/22/2014 Physical Exam  Nursing note and vitals reviewed. Constitutional: She  is oriented to person, place, and time. She appears well-developed and well-nourished. No distress.  HENT:  Head: Normocephalic and atraumatic.  Mouth/Throat: Oropharynx is clear and moist.  Eyes: Conjunctivae and EOM are normal. Pupils are equal, round, and reactive to light. No scleral icterus.  No horizontal, vertical or rotational nystagmus  Neck: Normal range of motion. Neck supple.  Full active and passive ROM without pain No midline or  paraspinal tenderness No nuchal rigidity or meningeal signs  Cardiovascular: Regular rhythm, normal heart sounds and intact distal pulses.   No murmur heard. Tachycardic  Pulmonary/Chest: Effort normal and breath sounds normal. No respiratory distress. She has no wheezes. She has no rales.  Abdominal: Soft. Bowel sounds are normal. She exhibits no distension. There is no tenderness. There is no rebound and no guarding.  Musculoskeletal: Normal range of motion.  Full ROM of the right hip, knee and ankle Pt ambulates without gait disturbance  Lymphadenopathy:    She has no cervical adenopathy.  Neurological: She is alert and oriented to person, place, and time. She has normal reflexes. No cranial nerve deficit. She exhibits normal muscle tone. Coordination normal.  Mental Status:  Alert, oriented, thought content appropriate. Speech fluent without evidence of aphasia. Able to follow 2 step commands without difficulty.  Cranial Nerves:  II:  Peripheral visual fields grossly normal, pupils equal, round, reactive to light III,IV, VI: ptosis not present, extra-ocular motions intact bilaterally  V,VII: smile symmetric, facial light touch sensation equal VIII: hearing grossly normal bilaterally  IX,X: gag reflex present  XI: bilateral shoulder shrug equal and strong XII: midline tongue extension  Motor:  5/5 in upper and lower extremities bilaterally including strong and equal grip strength and dorsiflexion/plantar flexion Sensory: Pinprick and light touch normal in all extremities.  Deep Tendon Reflexes: 2+ and symmetric  Cerebellar: normal finger-to-nose with bilateral upper extremities Gait: normal gait and balance CV: distal pulses palpable throughout   Skin: Skin is warm and dry. No rash noted. She is not diaphoretic. No erythema.  Psychiatric: She has a normal mood and affect. Her behavior is normal. Judgment and thought content normal.    ED Course  LUMBAR PUNCTURE Date/Time:  04/24/2014 8:38 PM Performed by: Abigail Butts Authorized by: Abigail Butts Consent: Verbal consent obtained. Risks and benefits: risks, benefits and alternatives were discussed Consent given by: patient Patient understanding: patient states understanding of the procedure being performed Patient consent: the patient's understanding of the procedure matches consent given Procedure consent: procedure consent matches procedure scheduled Relevant documents: relevant documents present and verified Site marked: the operative site was marked Required items: required blood products, implants, devices, and special equipment available Patient identity confirmed: verbally with patient and arm band Time out: Immediately prior to procedure a "time out" was called to verify the correct patient, procedure, equipment, support staff and site/side marked as required. Indications: evaluation for infection Anesthesia: local infiltration Local anesthetic: lidocaine 2% with epinephrine Anesthetic total: 7 ml Patient sedated: no Preparation: Patient was prepped and draped in the usual sterile fashion. Lumbar space: L4-L5 interspace Patient's position: sitting Needle gauge: 22 Needle type: spinal needle - Quincke tip Needle length: 3.5 in Number of attempts: 3 Patient tolerance: Patient tolerated the procedure well with no immediate complications. Comments: Unsuccessful LP   (including critical care time) Labs Review Labs Reviewed  CBC WITH DIFFERENTIAL - Abnormal; Notable for the following:    WBC 30.1 (*)    RBC 5.71 (*)    Hemoglobin 10.1 (*)  HCT 32.8 (*)    MCV 57.4 (*)    MCH 17.7 (*)    RDW 20.1 (*)    Neutrophils Relative % 89 (*)    Lymphocytes Relative 5 (*)    Neutro Abs 26.8 (*)    Monocytes Absolute 1.8 (*)    All other components within normal limits  COMPREHENSIVE METABOLIC PANEL - Abnormal; Notable for the following:    Sodium 136 (*)    Glucose, Bld 118 (*)     BUN 5 (*)    Anion gap 17 (*)    All other components within normal limits  CSF CELL COUNT WITH DIFFERENTIAL - Abnormal; Notable for the following:    Color, CSF PINK (*)    RBC Count, CSF 880 (*)    All other components within normal limits  CSF CELL COUNT WITH DIFFERENTIAL - Abnormal; Notable for the following:    RBC Count, CSF 102 (*)    All other components within normal limits  PROTEIN AND GLUCOSE, CSF - Abnormal; Notable for the following:    Glucose, CSF 84 (*)    All other components within normal limits  I-STAT CG4 LACTIC ACID, ED - Abnormal; Notable for the following:    Lactic Acid, Venous 3.25 (*)    All other components within normal limits  GRAM STAIN  CULTURE, BLOOD (ROUTINE X 2)  CULTURE, BLOOD (ROUTINE X 2)  CSF CULTURE  URINALYSIS, ROUTINE W REFLEX MICROSCOPIC  CBC  BASIC METABOLIC PANEL  HIV ANTIBODY (ROUTINE TESTING)  POC URINE PREG, ED  I-STAT CG4 LACTIC ACID, ED    Imaging Review Dg Chest 2 View  04/24/2014   CLINICAL DATA:  Fever, headache, weakness  EXAM: CHEST  2 VIEW  COMPARISON:  07/29/2011  FINDINGS: Cardiomediastinal silhouette is stable. No acute infiltrate or pleural effusion. No pulmonary edema. Bony thorax is unremarkable.  IMPRESSION: No active cardiopulmonary disease.   Electronically Signed   By: Lahoma Crocker M.D.   On: 04/24/2014 16:24   Ct Head Wo Contrast  04/24/2014   CLINICAL DATA:  Fever, weakness, headache.  EXAM: CT HEAD WITHOUT CONTRAST  TECHNIQUE: Contiguous axial images were obtained from the base of the skull through the vertex without contrast.  COMPARISON:  None  FINDINGS: Normal appearance of the intracranial structures. No evidence for acute hemorrhage, mass lesion, midline shift, hydrocephalus or large infarct. Symmetric appearing temporal lobes without areas of low attenuation. No acute bony abnormality. The visualized sinuses are free of acute fluid. Minor retention cyst in the dependent LEFT maxillary sinus. No mastoid or middle  ear fluid. Negative orbits.  IMPRESSION: No acute intracranial abnormality.  Negative exam.   Electronically Signed   By: Rolla Flatten M.D.   On: 04/24/2014 21:58   Dg Fluoro Guide Lumbar Puncture  04/24/2014   CLINICAL DATA:  Headache, fever, concern for meningitis  EXAM: DIAGNOSTIC LUMBAR PUNCTURE UNDER FLUOROSCOPIC GUIDANCE  FLUOROSCOPY TIME:  1 min 46 seconds  PROCEDURE: Informed consent was obtained from the patient prior to the procedure, including potential complications of headache, allergy, and pain. With the patient prone, the lower back was prepped with Betadine. 1% Lidocaine was used for local anesthesia. Lumbar puncture was performed at the L4-5 level using a 20 gauge needle with return of clear CSF . 43ml of CSF were obtained for laboratory studies. The patient tolerated the procedure well and there were no apparent complications.  IMPRESSION: Successful lumbar puncture under fluoroscopic guidance. Due to equipment failure, images could not be  saved.   Electronically Signed   By: Skipper Cliche M.D.   On: 04/24/2014 23:11     EKG Interpretation   Date/Time:  Friday April 24 2014 15:27:02 EDT Ventricular Rate:  97 PR Interval:  152 QRS Duration: 92 QT Interval:  340 QTC Calculation: 431 R Axis:   122 Text Interpretation:  Sinus rhythm with occasional Premature ventricular  complexes Right atrial enlargement Right axis deviation Nonspecific ST  abnormality PVCs new since previous  Confirmed by YAO  MD, DAVID ([id number])  on 04/24/2014 4:53:15 PM      CRITICAL CARE Performed by: Abigail Butts Total critical care time: 19min Critical care time was exclusive of separately billable procedures and treating other patients. Critical care was necessary to treat or prevent imminent or life-threatening deterioration. Critical care was time spent personally by me on the following activities: development of treatment plan with patient and/or surrogate as well as nursing, discussions with  consultants, evaluation of patient's response to treatment, examination of patient, obtaining history from patient or surrogate, ordering and performing treatments and interventions, ordering and review of laboratory studies, ordering and review of radiographic studies, pulse oximetry and re-evaluation of patient's condition.   MDM   Final diagnoses:  SIRS (systemic inflammatory response syndrome)   Marvel Plan presents with a headache, several episodes of vomiting, general body aches and generally ill appearing. She is tachycardic to 150 beats per minute febrile to 102.  White blood cell count 30, mild anemia at 10.1. CMP unremarkable. Urinalysis evidence of urinary tract infection.  Lactic acid 3.25. Patient given several fluid boluses with persistent tachycardia and fever.  Pt meets SIRS criteria; no source of infection.   Chest x-ray without evidence of pneumonia.  Cultures pending.  Patient without nuchal rigidity or meningeal signs however with headache, significant lymphocytosis and fever lumbar puncture is necessary.  Lumbar puncture unsuccessful here in the emergency department she will need LP under fluoroscopy. Head CT unremarkable.  Reassessment prior to admission shows decreased lactic acid, resolution of tachycardia and stable vital signs.  CSF cultures pending.  Discussed with Dr. Alcario Drought of [facility] who will [facility]. CSF cultures continued pain.    BP 146/89  Pulse 96  Temp(Src) 99.1 F (37.3 C) (Oral)  Resp 18  Ht 5\' 9"  (1.753 m)  Wt 276 lb (125.193 kg)  BMI 40.74 kg/m2  SpO2 99%  LMP 01/22/2014  The patient was discussed with and seen by Dr. Darl Householder who agrees with the treatment plan.    Jarrett Soho Reginald Mangels, PA-C 04/25/14 [phone]

## 2014-04-25 ENCOUNTER — Inpatient Hospital Stay (HOSPITAL_COMMUNITY): Payer: Self-pay

## 2014-04-25 ENCOUNTER — Encounter (HOSPITAL_COMMUNITY): Payer: Self-pay | Admitting: *Deleted

## 2014-04-25 DIAGNOSIS — M79609 Pain in unspecified limb: Secondary | ICD-10-CM

## 2014-04-25 DIAGNOSIS — M7989 Other specified soft tissue disorders: Secondary | ICD-10-CM

## 2014-04-25 LAB — GRAM STAIN

## 2014-04-25 LAB — CBC WITH DIFFERENTIAL/PLATELET
BASOS ABS: 0 10*3/uL (ref 0.0–0.1)
Basophils Relative: 0 % (ref 0–1)
EOS ABS: 0 10*3/uL (ref 0.0–0.7)
EOS PCT: 0 % (ref 0–5)
HCT: 29.8 % — ABNORMAL LOW (ref 36.0–46.0)
Hemoglobin: 9.1 g/dL — ABNORMAL LOW (ref 12.0–15.0)
LYMPHS ABS: 2.1 10*3/uL (ref 0.7–4.0)
Lymphocytes Relative: 12 % (ref 12–46)
MCH: 17.6 pg — ABNORMAL LOW (ref 26.0–34.0)
MCHC: 30.5 g/dL (ref 30.0–36.0)
MCV: 57.5 fL — AB (ref 78.0–100.0)
Monocytes Absolute: 2.1 10*3/uL — ABNORMAL HIGH (ref 0.1–1.0)
Monocytes Relative: 12 % (ref 3–12)
Neutro Abs: 12.8 10*3/uL — ABNORMAL HIGH (ref 1.7–7.7)
Neutrophils Relative %: 75 % (ref 43–77)
PLATELETS: 263 10*3/uL (ref 150–400)
RBC: 5.18 MIL/uL — ABNORMAL HIGH (ref 3.87–5.11)
RDW: 20.3 % — AB (ref 11.5–15.5)
WBC: 17 10*3/uL — ABNORMAL HIGH (ref 4.0–10.5)

## 2014-04-25 LAB — CSF CELL COUNT WITH DIFFERENTIAL
RBC Count, CSF: 102 /mm3 — ABNORMAL HIGH
RBC Count, CSF: 880 /mm3 — ABNORMAL HIGH
TUBE #: 4
Tube #: 1
WBC CSF: 3 /mm3 (ref 0–5)
WBC, CSF: 1 /mm3 (ref 0–5)

## 2014-04-25 LAB — BASIC METABOLIC PANEL
Anion gap: 15 (ref 5–15)
BUN: 6 mg/dL (ref 6–23)
CHLORIDE: 103 meq/L (ref 96–112)
CO2: 22 meq/L (ref 19–32)
Calcium: 8.4 mg/dL (ref 8.4–10.5)
Creatinine, Ser: 0.75 mg/dL (ref 0.50–1.10)
GFR calc Af Amer: >90 mL/min (ref >90)
GLUCOSE: 120 mg/dL — AB (ref 70–99)
POTASSIUM: 3.6 meq/L — AB (ref 3.7–5.3)
SODIUM: 140 meq/L (ref 137–147)

## 2014-04-25 LAB — HIV ANTIBODY (ROUTINE TESTING W REFLEX): HIV: NONREACTIVE

## 2014-04-25 LAB — CBC
HEMATOCRIT: 30.6 % — AB (ref 36.0–46.0)
HEMOGLOBIN: 9.6 g/dL — AB (ref 12.0–15.0)
MCH: 17.9 pg — ABNORMAL LOW (ref 26.0–34.0)
MCHC: 31.4 g/dL (ref 30.0–36.0)
MCV: 57.1 fL — ABNORMAL LOW (ref 78.0–100.0)
Platelets: 295 10*3/uL (ref 150–400)
RBC: 5.36 MIL/uL — AB (ref 3.87–5.11)
RDW: 20.4 % — ABNORMAL HIGH (ref 11.5–15.5)
WBC: 24.6 10*3/uL — ABNORMAL HIGH (ref 4.0–10.5)

## 2014-04-25 LAB — CK: Total CK: 234 U/L — ABNORMAL HIGH (ref 7–177)

## 2014-04-25 MED ORDER — DEXTROSE 5 % IV SOLN
1.0000 g | INTRAVENOUS | Status: DC
  Administered 2014-04-25 – 2014-04-27 (×3): 1 g via INTRAVENOUS
  Filled 2014-04-25 (×5): qty 10

## 2014-04-25 MED ORDER — CEFTRIAXONE SODIUM 2 G IJ SOLR
2.0000 g | Freq: Two times a day (BID) | INTRAMUSCULAR | Status: DC
  Filled 2014-04-25: qty 2

## 2014-04-25 MED ORDER — IOHEXOL 300 MG/ML  SOLN
25.0000 mL | INTRAMUSCULAR | Status: AC
  Administered 2014-04-25 (×2): 25 mL via ORAL

## 2014-04-25 MED ORDER — POTASSIUM CHLORIDE CRYS ER 20 MEQ PO TBCR: 40.0000 meq | EXTENDED_RELEASE_TABLET | Freq: Once | ORAL | Status: DC

## 2014-04-25 MED ORDER — POTASSIUM CHLORIDE CRYS ER 20 MEQ PO TBCR
30.0000 meq | EXTENDED_RELEASE_TABLET | Freq: Once | ORAL | Status: AC
  Administered 2014-04-25: 30 meq via ORAL
  Filled 2014-04-25: qty 1

## 2014-04-25 MED ORDER — ACETAMINOPHEN 325 MG PO TABS
650.0000 mg | ORAL_TABLET | Freq: Four times a day (QID) | ORAL | Status: DC | PRN
  Administered 2014-04-25 – 2014-04-28 (×6): 650 mg via ORAL
  Filled 2014-04-25 (×7): qty 2

## 2014-04-25 MED ORDER — IOHEXOL 300 MG/ML  SOLN
100.0000 mL | Freq: Once | INTRAMUSCULAR | Status: AC | PRN
  Administered 2014-04-25: 100 mL via INTRAVENOUS

## 2014-04-25 NOTE — Progress Notes (Signed)
Patient alert and oriented x4, vital signs stable.  Spoke with patient about plan of care and diagnosis.  Patient had questions about sepsis; provided info sheet, at which point patient mentioned that she has "two rotting teeth that I [she] was supposed to have taken out months ago but kept procrastinating".  MD notified and aware.  Patient denies any additional questions or concerns at this time.  Will continue to monitor.

## 2014-04-25 NOTE — Progress Notes (Signed)
[facility] PROGRESS NOTE  KARAN INCLAN BMW:[id number] DOB: 08-02-1986 DOA: 04/24/2014 PCP: PROVIDER NOT IN SYSTEM  Assessment/Plan: 1-SIRS; Unclear source. No cough, no diarrhea.  UA negative, Chest X-ray negative,  CSF with only 1 WBC. Culture negative up to date.  Will order Ct abdomen and pelvis.  Right LE with no redness.  Check HIV.  Follow up Blood culture.  Continue with vancomycin and ceftriaxone.  WBC trending down.   2-Headache; Resolved. CT head negative.   3-Right LE pain. Check doppler. Check CK level.   4-Anemia: check anemia panel.   Code Status: Full Code.  Family Communication: care discussed with patient.  Disposition Plan: Remain inpatient.    Consultants:  none  Procedures:  Doppler LE negative for DVT  Antibiotics: Ceftriaxone 8-7 Vancomycin 8-7   HPI/Subjective: Complaining of right leg pain. No cough, diarrhea, SOB.   Objective: Filed Vitals:   04/25/14 0600  BP: 163/91  Pulse: 96  Temp: 98.4 F (36.9 C)  Resp: 18    Intake/Output Summary (Last 24 hours) at 04/25/14 0846 Last data filed at 04/25/14 2725  Gross per 24 hour  Intake 2035.42 ml  Output      1 ml  Net 2034.42 ml   Filed Weights   04/25/14 0052  Weight: 125.193 kg (276 lb)    Exam:   General: No distress  Cardiovascular: S 1, S 2 RRR  Respiratory: CTA  Abdomen: BS present, soft, NT  Musculoskeletal: trace edema.   Data Reviewed: Basic Metabolic Panel:  Recent Labs Lab 04/24/14 1525 04/25/14 0130  NA 136* 140  K 4.4 3.6*  CL 98 103  CO2 21 22  GLUCOSE 118* 120*  BUN 5* 6  CREATININE 0.63 0.75  CALCIUM 9.1 8.4   Liver Function Tests:  Recent Labs Lab 04/24/14 1525  AST 24  ALT 15  ALKPHOS 75  BILITOT 0.4  PROT 8.0  ALBUMIN 3.9   No results found for this basename: LIPASE, AMYLASE,  in the last 168 hours No results found for this basename: AMMONIA,  in the last 168 hours CBC:  Recent Labs Lab 04/24/14 1525  04/25/14 0130  WBC 30.1* 24.6*  NEUTROABS 26.8*  --   HGB 10.1* 9.6*  HCT 32.8* 30.6*  MCV 57.4* 57.1*  PLT 322 295   Cardiac Enzymes: No results found for this basename: CKTOTAL, CKMB, CKMBINDEX, TROPONINI,  in the last 168 hours BNP (last 3 results) No results found for this basename: PROBNP,  in the last 8760 hours CBG: No results found for this basename: GLUCAP,  in the last 168 hours  Recent Results (from the past 240 hour(s))  GRAM STAIN     Status: None   Collection Time    04/24/14 10:07 PM      Result Value Ref Range Status   Specimen Description CSF   Final   Special Requests NO 2 2CC   Final   Gram Stain     Final   Value: CYTOSPIN     WBC PRESENT,BOTH PMN AND MONONUCLEAR     NO ORGANISMS SEEN   Report Status 04/25/2014 FINAL   Final     Studies: Dg Chest 2 View  04/24/2014   CLINICAL DATA:  Fever, headache, weakness  EXAM: CHEST  2 VIEW  COMPARISON:  07/29/2011  FINDINGS: Cardiomediastinal silhouette is stable. No acute infiltrate or pleural effusion. No pulmonary edema. Bony thorax is unremarkable.  IMPRESSION: No active cardiopulmonary disease.   Electronically Signed   By:  Lahoma Crocker M.D.   On: 04/24/2014 16:24   Ct Head Wo Contrast  04/24/2014   CLINICAL DATA:  Fever, weakness, headache.  EXAM: CT HEAD WITHOUT CONTRAST  TECHNIQUE: Contiguous axial images were obtained from the base of the skull through the vertex without contrast.  COMPARISON:  None  FINDINGS: Normal appearance of the intracranial structures. No evidence for acute hemorrhage, mass lesion, midline shift, hydrocephalus or large infarct. Symmetric appearing temporal lobes without areas of low attenuation. No acute bony abnormality. The visualized sinuses are free of acute fluid. Minor retention cyst in the dependent LEFT maxillary sinus. No mastoid or middle ear fluid. Negative orbits.  IMPRESSION: No acute intracranial abnormality.  Negative exam.   Electronically Signed   By: Rolla Flatten M.D.   On:  04/24/2014 21:58   Dg Fluoro Guide Lumbar Puncture  04/24/2014   CLINICAL DATA:  Headache, fever, concern for meningitis  EXAM: DIAGNOSTIC LUMBAR PUNCTURE UNDER FLUOROSCOPIC GUIDANCE  FLUOROSCOPY TIME:  1 min 46 seconds  PROCEDURE: Informed consent was obtained from the patient prior to the procedure, including potential complications of headache, allergy, and pain. With the patient prone, the lower back was prepped with Betadine. 1% Lidocaine was used for local anesthesia. Lumbar puncture was performed at the L4-5 level using a 20 gauge needle with return of clear CSF . 82ml of CSF were obtained for laboratory studies. The patient tolerated the procedure well and there were no apparent complications.  IMPRESSION: Successful lumbar puncture under fluoroscopic guidance. Due to equipment failure, images could not be saved.   Electronically Signed   By: Skipper Cliche M.D.   On: 04/24/2014 23:11    Scheduled Meds: . cefTRIAXone (ROCEPHIN)  IV  1 g Intravenous Q24H  . heparin  5,000 Units Subcutaneous 3 times per day  . lidocaine-EPINEPHrine  20 mL Intradermal Once  . potassium chloride  40 mEq Oral Once  . sodium chloride  3 mL Intravenous Q12H  . vancomycin  1,000 mg Intravenous Q8H   Continuous Infusions: . sodium chloride 125 mL/hr (04/25/14 0106)    Active Problems:   SIRS (systemic inflammatory response syndrome)    Time spent: 35 minutes.     Niel Hummer A  [facility] Pager [phone]. If 7PM-7AM, please contact night-coverage at [email or web address], password [facility] 04/25/2014, 8:46 AM  LOS: 1 day

## 2014-04-25 NOTE — Plan of Care (Signed)
Problem: Phase I Progression Outcomes Goal: OOB as tolerated unless otherwise ordered Outcome: Completed/Met Date Met:  04/25/14 Patient up ad lib in room

## 2014-04-25 NOTE — Progress Notes (Signed)
Spoke with Cherrie with Infection Control office, ok to d/c droplet isolation as CSF tap negative for meningitis per Dr. Alcario Drought.  Dr. Tyrell Antonio made aware.  Will continue to monitor.

## 2014-04-25 NOTE — ED Notes (Signed)
Attempted report x1. 

## 2014-04-25 NOTE — Progress Notes (Signed)
VASCULAR LAB PRELIMINARY  PRELIMINARY  PRELIMINARY  PRELIMINARY  Right lower extremity venous Doppler completed.    Preliminary report:  There is no DVT or SVT noted in the right lower extremity.   Daritza Brees, RVT 04/25/2014, 11:06 AM

## 2014-04-25 NOTE — Progress Notes (Signed)
CSF tap negative for meningitis by my interpretation with 1 WBC only, normal protein and glucose.  Reducing rocephin dosing to 1gm q24h for sepsis from the 2gm q12h meningitis dose.

## 2014-04-25 NOTE — Progress Notes (Signed)
The patient arrived to 3E27 from the ED at 0100.  She was oriented to the unit and placed on telemetry.  Her HR is in the 90s and she does not have any complaints of this time.  Her fiance is at the bedside and the call bell is within reach.  She was told to call the nurse or nurse tech when she needed to ambulate to the bathroom and verbalized understanding.

## 2014-04-25 NOTE — ED Provider Notes (Signed)
Medical screening examination/treatment/procedure(s) were conducted as a shared visit with non-physician practitioner(s) and myself.  I personally evaluated the patient during the encounter.   EKG Interpretation   Date/Time:  Friday April 24 2014 15:27:02 EDT Ventricular Rate:  97 PR Interval:  152 QRS Duration: 92 QT Interval:  340 QTC Calculation: 431 R Axis:   122 Text Interpretation:  Sinus rhythm with occasional Premature ventricular  complexes Right atrial enlargement Right axis deviation Nonspecific ST  abnormality PVCs new since previous  Confirmed by Reyn Faivre  MD, Jurline Folger ([id number])  on 04/24/2014 4:53:15 PM      Brenda [name] is a 28 y.o. female hx of anemia here with fever, vomiting, headache. Symptoms started this morning. Tmax 101 at home. Had several episode of vomiting afterwards. Also ha R thigh pain as well but denies weakness. On exam, ill appearing. Tachycardic, febrile. No obvious meningeal signs. R thigh with obvious rash or tenderness. WBC 30, lactate elevated. Concerned for sepsis. CXR and UA clear so I counseled her about getting LP. She had previous epidural with bad experience. I spent time to discuss risk and benefits and she agreed to LP. We attempted LP many times but was unsuccessful due to body habitus. Patient given ceftriaxone. We called IR to do it under floro. Will admit for further sepsis workup and possible meningitis.   CRITICAL CARE Performed by: Darl Householder, Myrikal Messmer   Total critical care time: 30 min   Critical care time was exclusive of separately billable procedures and treating other patients.  Critical care was necessary to treat or prevent imminent or life-threatening deterioration.  Critical care was time spent personally by me on the following activities: development of treatment plan with patient and/or surrogate as well as nursing, discussions with consultants, evaluation of patient's response to treatment, examination of patient, obtaining history from  patient or surrogate, ordering and performing treatments and interventions, ordering and review of laboratory studies, ordering and review of radiographic studies, pulse oximetry and re-evaluation of patient's condition.    Wandra Arthurs, MD 04/25/14 1150

## 2014-04-26 ENCOUNTER — Inpatient Hospital Stay (HOSPITAL_COMMUNITY): Payer: BC Managed Care – PPO

## 2014-04-26 LAB — BASIC METABOLIC PANEL
Anion gap: 13 (ref 5–15)
BUN: 7 mg/dL (ref 6–23)
CALCIUM: 8.6 mg/dL (ref 8.4–10.5)
CO2: 22 mEq/L (ref 19–32)
CREATININE: 0.73 mg/dL (ref 0.50–1.10)
Chloride: 103 mEq/L (ref 96–112)
GFR calc Af Amer: >90 mL/min (ref >90)
GFR calc non Af Amer: >90 mL/min (ref >90)
GLUCOSE: 120 mg/dL — AB (ref 70–99)
Potassium: 4.1 mEq/L (ref 3.7–5.3)
Sodium: 138 mEq/L (ref 137–147)

## 2014-04-26 LAB — RETICULOCYTES
RBC.: 5.24 MIL/uL — ABNORMAL HIGH (ref 3.87–5.11)
RETIC CT PCT: 1 % (ref 0.4–3.1)
Retic Count, Absolute: 52.4 10*3/uL (ref 19.0–186.0)

## 2014-04-26 LAB — IRON AND TIBC
Iron: 10 ug/dL — ABNORMAL LOW (ref 42–135)
SATURATION RATIOS: 3 % — AB (ref 20–55)
TIBC: 368 ug/dL (ref 250–470)
UIBC: 358 ug/dL (ref 125–400)

## 2014-04-26 LAB — FOLATE: Folate: 18.6 ng/mL

## 2014-04-26 LAB — CK: CK TOTAL: 206 U/L — AB (ref 7–177)

## 2014-04-26 LAB — VITAMIN B12: Vitamin B-12: 1532 pg/mL — ABNORMAL HIGH (ref 211–911)

## 2014-04-26 LAB — CBC
HEMATOCRIT: 30.2 % — AB (ref 36.0–46.0)
Hemoglobin: 9.2 g/dL — ABNORMAL LOW (ref 12.0–15.0)
MCH: 17.6 pg — AB (ref 26.0–34.0)
MCHC: 30.5 g/dL (ref 30.0–36.0)
MCV: 57.6 fL — AB (ref 78.0–100.0)
Platelets: 259 10*3/uL (ref 150–400)
RBC: 5.24 MIL/uL — ABNORMAL HIGH (ref 3.87–5.11)
RDW: 20.4 % — ABNORMAL HIGH (ref 11.5–15.5)
WBC: 12.7 10*3/uL — ABNORMAL HIGH (ref 4.0–10.5)

## 2014-04-26 LAB — URINE CULTURE
CULTURE: NO GROWTH
Colony Count: NO GROWTH

## 2014-04-26 LAB — FERRITIN: FERRITIN: 58 ng/mL (ref 10–291)

## 2014-04-26 LAB — VANCOMYCIN, TROUGH: Vancomycin Tr: 11.7 ug/mL (ref 10.0–20.0)

## 2014-04-26 MED ORDER — VANCOMYCIN HCL 10 G IV SOLR
1250.0000 mg | Freq: Four times a day (QID) | INTRAVENOUS | Status: DC
  Administered 2014-04-26 – 2014-04-27 (×4): 1250 mg via INTRAVENOUS
  Filled 2014-04-26 (×7): qty 1250

## 2014-04-26 NOTE — Progress Notes (Signed)
The patient's lower right extremity is more swollen this morning around the ankle area than at the beginning of the shift last night.  It is also red.  She is still complaining of 8/10 tightness and pain in that leg this morning.  [facility] NP M. Donnal Debar was notified and the patient was given Tylenol.

## 2014-04-26 NOTE — Progress Notes (Signed)
[facility] PROGRESS NOTE  Brenda [name] YDX:[id number] DOB: 05-Jul-1986 DOA: 04/24/2014 PCP: PROVIDER NOT IN SYSTEM  Assessment/Plan: 1-SIRS; secondary to Right LE cellulitis.  UA negative, Chest X-ray negative,  CSF with only 1 WBC. Culture negative up to date.  Ct abdomen and pelvis negative for source of infection.  HIV non reactive Blood culture no growth to date.   Continue with vancomycin and ceftriaxone day 2.  WBC 24---12   2-Right LE cellulitis; WBC trending down. Continue with vancomycin and ceftriaxone. Check x ray. Repeat CK level. Doppler negative for DVT.   Headache; Resolved. CT head negative.   -Anemia: Anemia panel pending.   Code Status: Full Code.  Family Communication: care discussed with patient.  Disposition Plan: Remain inpatient.    Consultants:  none  Procedures:  Doppler LE negative for DVT  Antibiotics: Ceftriaxone 8-7 Vancomycin 8-7   HPI/Subjective: Relates right leg got more swelling, redness. No thigh pain.   Objective: Filed Vitals:   04/26/14 1401  BP: 130/79  Pulse: 70  Temp: 98.2 F (36.8 C)  Resp: 18    Intake/Output Summary (Last 24 hours) at 04/26/14 1456 Last data filed at 04/26/14 1300  Gross per 24 hour  Intake 5099.75 ml  Output   4500 ml  Net 599.75 ml   Filed Weights   04/25/14 0052 04/26/14 0431  Weight: 125.193 kg (276 lb) 128.368 kg (283 lb)    Exam:   General: No distress  Cardiovascular: S 1, S 2 RRR  Respiratory: CTA  Abdomen: BS present, soft, NT  Musculoskeletal: Right LE edema, with redness, warm.   Data Reviewed: Basic Metabolic Panel:  Recent Labs Lab 04/24/14 1525 04/25/14 0130 04/26/14 0455  NA 136* 140 138  K 4.4 3.6* 4.1  CL 98 103 103  CO2 21 22 22   GLUCOSE 118* 120* 120*  BUN 5* 6 7  CREATININE 0.63 0.75 0.73  CALCIUM 9.1 8.4 8.6   Liver Function Tests:  Recent Labs Lab 04/24/14 1525  AST 24  ALT 15  ALKPHOS 75  BILITOT 0.4  PROT 8.0  ALBUMIN 3.9    No results found for this basename: LIPASE, AMYLASE,  in the last 168 hours No results found for this basename: AMMONIA,  in the last 168 hours CBC:  Recent Labs Lab 04/24/14 1525 04/25/14 0130 04/25/14 1438 04/26/14 0950  WBC 30.1* 24.6* 17.0* 12.7*  NEUTROABS 26.8*  --  12.8*  --   HGB 10.1* 9.6* 9.1* 9.2*  HCT 32.8* 30.6* 29.8* 30.2*  MCV 57.4* 57.1* 57.5* 57.6*  PLT 322 295 263 259   Cardiac Enzymes:  Recent Labs Lab 04/25/14 1438 04/26/14 0850  CKTOTAL 234* 206*   BNP (last 3 results) No results found for this basename: PROBNP,  in the last 8760 hours CBG: No results found for this basename: GLUCAP,  in the last 168 hours  Recent Results (from the past 240 hour(s))  CULTURE, BLOOD (ROUTINE X 2)     Status: None   Collection Time    04/24/14  6:20 PM      Result Value Ref Range Status   Specimen Description BLOOD ARM RIGHT   Final   Special Requests BOTTLES DRAWN AEROBIC ONLY 10CC   Final   Culture  Setup Time     Final   Value: 04/25/2014 00:40     Performed at [facility]   Culture     Final   Value:        BLOOD  CULTURE RECEIVED NO GROWTH TO DATE CULTURE WILL BE HELD FOR 5 DAYS BEFORE ISSUING A FINAL NEGATIVE REPORT     Performed at [facility]   Report Status PENDING   Incomplete  CULTURE, BLOOD (ROUTINE X 2)     Status: None   Collection Time    04/24/14  6:35 PM      Result Value Ref Range Status   Specimen Description BLOOD ARM RIGHT   Final   Special Requests BOTTLES DRAWN AEROBIC ONLY 10CC   Final   Culture  Setup Time     Final   Value: 04/25/2014 00:40     Performed at [facility]   Culture     Final   Value:        BLOOD CULTURE RECEIVED NO GROWTH TO DATE CULTURE WILL BE HELD FOR 5 DAYS BEFORE ISSUING A FINAL NEGATIVE REPORT     Performed at [facility]   Report Status PENDING   Incomplete  CSF CULTURE     Status: None   Collection Time    04/24/14 10:07 PM      Result Value Ref Range Status    Specimen Description CSF   Final   Special Requests NO 2 2CC   Final   Gram Stain     Final   Value: CYTOSPIN WBC PRESENT,BOTH PMN AND MONONUCLEAR     NO ORGANISMS SEEN     Performed at [facility]     Performed at [facility]   Culture     Final   Value: NO GROWTH 1 DAY     Performed at [facility]   Report Status PENDING   Incomplete  GRAM STAIN     Status: None   Collection Time    04/24/14 10:07 PM      Result Value Ref Range Status   Specimen Description CSF   Final   Special Requests NO 2 2CC   Final   Gram Stain     Final   Value: CYTOSPIN     WBC PRESENT,BOTH PMN AND MONONUCLEAR     NO ORGANISMS SEEN   Report Status 04/25/2014 FINAL   Final     Studies: Dg Chest 2 View  04/24/2014   CLINICAL DATA:  Fever, headache, weakness  EXAM: CHEST  2 VIEW  COMPARISON:  07/29/2011  FINDINGS: Cardiomediastinal silhouette is stable. No acute infiltrate or pleural effusion. No pulmonary edema. Bony thorax is unremarkable.  IMPRESSION: No active cardiopulmonary disease.   Electronically Signed   By: Lahoma Crocker M.D.   On: 04/24/2014 16:24   Ct Head Wo Contrast  04/24/2014   CLINICAL DATA:  Fever, weakness, headache.  EXAM: CT HEAD WITHOUT CONTRAST  TECHNIQUE: Contiguous axial images were obtained from the base of the skull through the vertex without contrast.  COMPARISON:  None  FINDINGS: Normal appearance of the intracranial structures. No evidence for acute hemorrhage, mass lesion, midline shift, hydrocephalus or large infarct. Symmetric appearing temporal lobes without areas of low attenuation. No acute bony abnormality. The visualized sinuses are free of acute fluid. Minor retention cyst in the dependent LEFT maxillary sinus. No mastoid or middle ear fluid. Negative orbits.  IMPRESSION: No acute intracranial abnormality.  Negative exam.   Electronically Signed   By: Rolla Flatten M.D.   On: 04/24/2014 21:58   Ct Abdomen Pelvis W Contrast  04/25/2014   CLINICAL DATA:   28 year old female fever, leukocytosis and nausea and vomiting.  EXAM: CT ABDOMEN AND PELVIS WITH CONTRAST  TECHNIQUE: Multidetector CT imaging of the abdomen and pelvis was performed using the standard protocol following bolus administration of intravenous contrast.  CONTRAST:  112mL OMNIPAQUE IOHEXOL 300 MG/ML  SOLN  COMPARISON:  None.  FINDINGS: Cardiomegaly identified.  The liver, gallbladder, spleen, pancreas, adrenal glands and kidneys are unremarkable.  There is no evidence of enlarged lymph nodes, biliary dilation or abdominal aortic aneurysm.  A small amount free pelvic fluid may be physiologic.  The bowel, bladder and appendix are unremarkable. There is no evidence of bowel obstruction, abscess or pneumoperitoneum.  No acute or suspicious bony abnormalities are present.  IMPRESSION: Small amount of free pelvic fluid which may be physiologic.  No other acute abnormalities identified.  Cardiomegaly.   Electronically Signed   By: Hassan Rowan M.D.   On: 04/25/2014 16:14   Dg Fluoro Guide Lumbar Puncture  04/24/2014   CLINICAL DATA:  Headache, fever, concern for meningitis  EXAM: DIAGNOSTIC LUMBAR PUNCTURE UNDER FLUOROSCOPIC GUIDANCE  FLUOROSCOPY TIME:  1 min 46 seconds  PROCEDURE: Informed consent was obtained from the patient prior to the procedure, including potential complications of headache, allergy, and pain. With the patient prone, the lower back was prepped with Betadine. 1% Lidocaine was used for local anesthesia. Lumbar puncture was performed at the L4-5 level using a 20 gauge needle with return of clear CSF . 28ml of CSF were obtained for laboratory studies. The patient tolerated the procedure well and there were no apparent complications.  IMPRESSION: Successful lumbar puncture under fluoroscopic guidance. Due to equipment failure, images could not be saved.   Electronically Signed   By: Skipper Cliche M.D.   On: 04/24/2014 23:11    Scheduled Meds: . cefTRIAXone (ROCEPHIN)  IV  1 g Intravenous  Q24H  . heparin  5,000 Units Subcutaneous 3 times per day  . lidocaine-EPINEPHrine  20 mL Intradermal Once  . sodium chloride  3 mL Intravenous Q12H  . vancomycin  1,250 mg Intravenous Q6H   Continuous Infusions: . sodium chloride 50 mL/hr at 04/26/14 1102    Active Problems:   SIRS (systemic inflammatory response syndrome)    Time spent: 30 minutes.     Niel Hummer A  [facility] Pager [phone]. If 7PM-7AM, please contact night-coverage at [email or web address], password [email or web address] 04/26/2014, 2:56 PM  LOS: 2 days

## 2014-04-27 DIAGNOSIS — L03119 Cellulitis of unspecified part of limb: Secondary | ICD-10-CM

## 2014-04-27 DIAGNOSIS — L02419 Cutaneous abscess of limb, unspecified: Secondary | ICD-10-CM

## 2014-04-27 DIAGNOSIS — D509 Iron deficiency anemia, unspecified: Secondary | ICD-10-CM

## 2014-04-27 LAB — CBC
HCT: 32.3 % — ABNORMAL LOW (ref 36.0–46.0)
HEMOGLOBIN: 9.9 g/dL — AB (ref 12.0–15.0)
MCH: 17.9 pg — ABNORMAL LOW (ref 26.0–34.0)
MCHC: 30.7 g/dL (ref 30.0–36.0)
MCV: 58.5 fL — ABNORMAL LOW (ref 78.0–100.0)
Platelets: 269 10*3/uL (ref 150–400)
RBC: 5.52 MIL/uL — ABNORMAL HIGH (ref 3.87–5.11)
RDW: 20.9 % — ABNORMAL HIGH (ref 11.5–15.5)
WBC: 11.1 10*3/uL — AB (ref 4.0–10.5)

## 2014-04-27 LAB — BASIC METABOLIC PANEL WITH GFR
Anion gap: 13 (ref 5–15)
BUN: 7 mg/dL (ref 6–23)
CO2: 23 meq/L (ref 19–32)
Calcium: 9 mg/dL (ref 8.4–10.5)
Chloride: 103 meq/L (ref 96–112)
Creatinine, Ser: 0.69 mg/dL (ref 0.50–1.10)
GFR calc Af Amer: >90 mL/min (ref >90)
GFR calc non Af Amer: >90 mL/min (ref >90)
Glucose, Bld: 117 mg/dL — ABNORMAL HIGH (ref 70–99)
Potassium: 3.9 meq/L (ref 3.7–5.3)
Sodium: 139 meq/L (ref 137–147)

## 2014-04-27 LAB — VANCOMYCIN, TROUGH: Vancomycin Tr: 19.8 ug/mL (ref 10.0–20.0)

## 2014-04-27 MED ORDER — VANCOMYCIN HCL 10 G IV SOLR
1250.0000 mg | Freq: Three times a day (TID) | INTRAVENOUS | Status: DC
  Administered 2014-04-27 – 2014-04-28 (×3): 1250 mg via INTRAVENOUS
  Filled 2014-04-27 (×5): qty 1250

## 2014-04-27 MED ORDER — FERROUS SULFATE 325 (65 FE) MG PO TABS
325.0000 mg | ORAL_TABLET | Freq: Three times a day (TID) | ORAL | Status: DC
  Administered 2014-04-27 – 2014-04-28 (×4): 325 mg via ORAL
  Filled 2014-04-27 (×6): qty 1

## 2014-04-27 MED ORDER — FUROSEMIDE 10 MG/ML IJ SOLN
INTRAMUSCULAR | Status: AC
Start: 2014-04-27 — End: 2014-04-27
  Filled 2014-04-27: qty 4

## 2014-04-27 MED ORDER — POTASSIUM CHLORIDE CRYS ER 20 MEQ PO TBCR
40.0000 meq | EXTENDED_RELEASE_TABLET | Freq: Once | ORAL | Status: AC
  Administered 2014-04-27: 40 meq via ORAL
  Filled 2014-04-27: qty 2

## 2014-04-27 MED ORDER — FUROSEMIDE 10 MG/ML IJ SOLN
20.0000 mg | Freq: Once | INTRAMUSCULAR | Status: AC
  Administered 2014-04-27: 20 mg via INTRAVENOUS

## 2014-04-27 NOTE — Progress Notes (Signed)
ANTIBIOTIC CONSULT NOTE - INITIAL  Pharmacy Consult for Vancomycin  Indication: rule out sepsis  Allergies  Allergen Reactions  . Oxycodone Hcl Hives  . Latex Itching    Patient Measurements: ~120 kg  Vital Signs: Temp: 98.1 F (36.7 C) (08/10 1021) Temp src: Oral (08/10 1021) BP: 138/76 mmHg (08/10 1021) Pulse Rate: 66 (08/10 1021)  Labs:  Recent Labs  04/25/14 0130 04/25/14 1438 04/26/14 0455 04/26/14 0950 04/27/14 0405  WBC 24.6* 17.0*  --  12.7* 11.1*  HGB 9.6* 9.1*  --  9.2* 9.9*  PLT 295 263  --  259 269  CREATININE 0.75  --  0.73  --  0.69   Medical History: Past Medical History  Diagnosis Date  . Anemia   . Asthma   . Abnormal Pap smear    Assessment: 28 y/o F on D#3 vancomycin and D#4 rocephin for SIRS d/t RLE cellulitis. Pt. Has been afebrile, wbc trending down to 11.1 today. Headache resolved. CSF culture ngtd. Vancomycin trough 19.8, previously 11.7 on 1g Q 8hrs.  Scr has been stable.   8/8 Ceftriaxone >> 8/8 Vancomycin >>   8/9: VT 11.7 on Vanc 1 gm IV Q 8 hours   8/8 Urine Cx >> neg 8/7: Blood Cx x2>> ngtd  8/7: CSF >> ngtd  Goal of Therapy:  Will decrease Vancomycin trough level goal to 10-15 mcg/ml  Plan:  - Change vancomycin to 1250 mg IV Q 8 hrs to avoid accumulation given lower goal for cellulitis - continue rocephin 1g IV Q 24 - f/u renal function and cultures - f/u LOT, recheck vancomycin trough if continues for > 72 hrs  Maryanna Shape, PharmD, BCPS  Clinical Pharmacist  Pager: [phone]   04/27/2014,1:39 PM

## 2014-04-27 NOTE — Progress Notes (Signed)
The patient complained of 6/10 pain in her right foot overnight.  She received Tylenol x1 and had a slight fever of 99.3 during the night.  Redness and swelling on her right ankle and foot are about the same this morning.  She did not have any other complaints overnight.

## 2014-04-27 NOTE — Care Management Note (Signed)
    Page 1 of 1   04/27/2014     4:33:24 PM CARE MANAGEMENT NOTE 04/27/2014  Patient:  Brenda [name], Brenda [name]   Account Number:  [id number]  Date Initiated:  04/27/2014  Documentation initiated by:  [facility]  Subjective/Objective Assessment:   28 y.o. female previously healthy other than anemia, presents to the ED with fever, weakness, headache, and right thigh pain. Fever  high as 103 noted in ED, tachycardic to 150s which came down to 110s by time of admission.//Hm with fiance.     Action/Plan:   IV abx.// Access for [facility] services   Anticipated DC Date:  04/28/2014   Anticipated DC Plan:  [place]  CM consult      Choice offered to / List presented to:             Status of service:  Completed, signed off Medicare Important Message given?   (If response is "NO", the following Medicare IM given date fields will be blank) Date Medicare IM given:   Medicare IM given by:   Date Additional Medicare IM given:   Additional Medicare IM given by:    Discharge Disposition:    Per UR Regulation:  Reviewed for med. necessity/level of care/duration of stay  If discussed at [place] of Stay Meetings, dates discussed:    Comments:

## 2014-04-27 NOTE — Progress Notes (Signed)
[facility] PROGRESS NOTE  Brenda [name] RSW:[id number] DOB: April 27, 1986 DOA: 04/24/2014 PCP: PROVIDER NOT IN SYSTEM  Assessment/Plan: 1-SIRS; secondary to Right LE cellulitis.  UA negative, Chest X-ray negative,  CSF with only 1 WBC. Culture negative up to date.  Ct abdomen and pelvis negative for source of infection.  HIV non reactive Blood culture no growth to date.   Continue with vancomycin and ceftriaxone day 3.  WBC 24---12   2-Right LE cellulitis; WBC trending down. Continue with vancomycin and ceftriaxone. Doppler negative for DVT. X ray with soft tissue swelling no bone affection. Will give one time dose lasix, keep leg elevated.   Headache; Resolved. CT head negative.   -Anemia: Anemia panel consistent with iron deficiency. Iron at 10, ferritin 58. Will start Ferrous sulfate. .   Code Status: Full Code.  Family Communication: care discussed with patient.  Disposition Plan: Remain inpatient.    Consultants:  none  Procedures:  Doppler LE negative for DVT  Antibiotics: Ceftriaxone 8-7 Vancomycin 8-7   HPI/Subjective: Feeling better, swelling decreasing. Pain better.   Objective: Filed Vitals:   04/27/14 1021  BP: 138/76  Pulse: 66  Temp: 98.1 F (36.7 C)  Resp: 18    Intake/Output Summary (Last 24 hours) at 04/27/14 1131 Last data filed at 04/27/14 1021  Gross per 24 hour  Intake 3741.67 ml  Output   4075 ml  Net -333.33 ml   Filed Weights   04/25/14 0052 04/26/14 0431 04/27/14 0537  Weight: 125.193 kg (276 lb) 128.368 kg (283 lb) 128.731 kg (283 lb 12.8 oz)    Exam:   General: No distress  Cardiovascular: S 1, S 2 RRR  Respiratory: CTA  Abdomen: BS present, soft, NT  Musculoskeletal: Right LE edema, with less redness.   Data Reviewed: Basic Metabolic Panel:  Recent Labs Lab 04/24/14 1525 04/25/14 0130 04/26/14 0455 04/27/14 0405  NA 136* 140 138 139  K 4.4 3.6* 4.1 3.9  CL 98 103 103 103  CO2 21 22 22 23   GLUCOSE  118* 120* 120* 117*  BUN 5* 6 7 7   CREATININE 0.63 0.75 0.73 0.69  CALCIUM 9.1 8.4 8.6 9.0   Liver Function Tests:  Recent Labs Lab 04/24/14 1525  AST 24  ALT 15  ALKPHOS 75  BILITOT 0.4  PROT 8.0  ALBUMIN 3.9   No results found for this basename: LIPASE, AMYLASE,  in the last 168 hours No results found for this basename: AMMONIA,  in the last 168 hours CBC:  Recent Labs Lab 04/24/14 1525 04/25/14 0130 04/25/14 1438 04/26/14 0950 04/27/14 0405  WBC 30.1* 24.6* 17.0* 12.7* 11.1*  NEUTROABS 26.8*  --  12.8*  --   --   HGB 10.1* 9.6* 9.1* 9.2* 9.9*  HCT 32.8* 30.6* 29.8* 30.2* 32.3*  MCV 57.4* 57.1* 57.5* 57.6* 58.5*  PLT 322 295 263 259 269   Cardiac Enzymes:  Recent Labs Lab 04/25/14 1438 04/26/14 0850  CKTOTAL 234* 206*   BNP (last 3 results) No results found for this basename: PROBNP,  in the last 8760 hours CBG: No results found for this basename: GLUCAP,  in the last 168 hours  Recent Results (from the past 240 hour(s))  CULTURE, BLOOD (ROUTINE X 2)     Status: None   Collection Time    04/24/14  6:20 PM      Result Value Ref Range Status   Specimen Description BLOOD ARM RIGHT   Final   Special Requests BOTTLES DRAWN AEROBIC  ONLY 10CC   Final   Culture  Setup Time     Final   Value: 04/25/2014 00:40     Performed at [facility]   Culture     Final   Value:        BLOOD CULTURE RECEIVED NO GROWTH TO DATE CULTURE WILL BE HELD FOR 5 DAYS BEFORE ISSUING A FINAL NEGATIVE REPORT     Performed at [facility]   Report Status PENDING   Incomplete  CULTURE, BLOOD (ROUTINE X 2)     Status: None   Collection Time    04/24/14  6:35 PM      Result Value Ref Range Status   Specimen Description BLOOD ARM RIGHT   Final   Special Requests BOTTLES DRAWN AEROBIC ONLY 10CC   Final   Culture  Setup Time     Final   Value: 04/25/2014 00:40     Performed at [facility]   Culture     Final   Value:        BLOOD CULTURE RECEIVED NO GROWTH TO  DATE CULTURE WILL BE HELD FOR 5 DAYS BEFORE ISSUING A FINAL NEGATIVE REPORT     Performed at [facility]   Report Status PENDING   Incomplete  CSF CULTURE     Status: None   Collection Time    04/24/14 10:07 PM      Result Value Ref Range Status   Specimen Description CSF   Final   Special Requests NO 2 2CC   Final   Gram Stain     Final   Value: CYTOSPIN WBC PRESENT,BOTH PMN AND MONONUCLEAR     NO ORGANISMS SEEN     Performed at [facility]     Performed at [facility]   Culture     Final   Value: NO GROWTH 2 DAYS     Performed at [facility]   Report Status PENDING   Incomplete  GRAM STAIN     Status: None   Collection Time    04/24/14 10:07 PM      Result Value Ref Range Status   Specimen Description CSF   Final   Special Requests NO 2 2CC   Final   Gram Stain     Final   Value: CYTOSPIN     WBC PRESENT,BOTH PMN AND MONONUCLEAR     NO ORGANISMS SEEN   Report Status 04/25/2014 FINAL   Final  URINE CULTURE     Status: None   Collection Time    04/25/14 10:30 AM      Result Value Ref Range Status   Specimen Description URINE, CLEAN CATCH   Final   Special Requests NONE   Final   Culture  Setup Time     Final   Value: 04/25/2014 21:51     Performed at [facility]     Final   Value: NO GROWTH     Performed at [facility]   Culture     Final   Value: NO GROWTH     Performed at [facility]   Report Status 04/26/2014 FINAL   Final     Studies: Ct Abdomen Pelvis W Contrast  04/25/2014   CLINICAL DATA:  28 year old female fever, leukocytosis and nausea and vomiting.  EXAM: CT ABDOMEN AND PELVIS WITH CONTRAST  TECHNIQUE: Multidetector CT imaging of the abdomen and pelvis was performed using  the standard protocol following bolus administration of intravenous contrast.  CONTRAST:  136mL OMNIPAQUE IOHEXOL 300 MG/ML  SOLN  COMPARISON:  None.  FINDINGS: Cardiomegaly identified.  The liver, gallbladder,  spleen, pancreas, adrenal glands and kidneys are unremarkable.  There is no evidence of enlarged lymph nodes, biliary dilation or abdominal aortic aneurysm.  A small amount free pelvic fluid may be physiologic.  The bowel, bladder and appendix are unremarkable. There is no evidence of bowel obstruction, abscess or pneumoperitoneum.  No acute or suspicious bony abnormalities are present.  IMPRESSION: Small amount of free pelvic fluid which may be physiologic.  No other acute abnormalities identified.  Cardiomegaly.   Electronically Signed   By: Hassan Rowan M.D.   On: 04/25/2014 16:14   Dg Foot Complete Right  04/26/2014   CLINICAL DATA:  Pain, redness, itching and swelling of the right foot for the past 2 days.  EXAM: RIGHT FOOT COMPLETE - 3+ VIEW  COMPARISON:  04/26/2010.  FINDINGS: Soft tissue swelling around the right foot diffusely (most severe around the midfoot). No acute displaced fracture, subluxation or dislocation. No aggressive appearing bony lesion.  IMPRESSION: 1. Diffuse soft tissue swelling most evident overlying the midfoot. No underlying acute bony abnormality.   Electronically Signed   By: Vinnie Langton M.D.   On: 04/26/2014 17:31    Scheduled Meds: . cefTRIAXone (ROCEPHIN)  IV  1 g Intravenous Q24H  . ferrous sulfate  325 mg Oral TID WC  . heparin  5,000 Units Subcutaneous 3 times per day  . lidocaine-EPINEPHrine  20 mL Intradermal Once  . sodium chloride  3 mL Intravenous Q12H  . vancomycin  1,250 mg Intravenous Q6H   Continuous Infusions:    Active Problems:   SIRS (systemic inflammatory response syndrome)   Cellulitis of leg, right    Time spent: 30 minutes.     Niel Hummer A  [facility] Pager [phone]. If 7PM-7AM, please contact night-coverage at [email or web address], password [facility] 04/27/2014, 11:31 AM  LOS: 3 days

## 2014-04-28 LAB — BASIC METABOLIC PANEL
ANION GAP: 19 — AB (ref 5–15)
Anion gap: 11 (ref 5–15)
BUN: 10 mg/dL (ref 6–23)
BUN: 8 mg/dL (ref 6–23)
CALCIUM: 8.9 mg/dL (ref 8.4–10.5)
CO2: 17 mEq/L — ABNORMAL LOW (ref 19–32)
CO2: 25 mEq/L (ref 19–32)
Calcium: 9.2 mg/dL (ref 8.4–10.5)
Chloride: 102 mEq/L (ref 96–112)
Chloride: 103 mEq/L (ref 96–112)
Creatinine, Ser: 0.71 mg/dL (ref 0.50–1.10)
Creatinine, Ser: 0.76 mg/dL (ref 0.50–1.10)
GFR calc Af Amer: >90 mL/min (ref >90)
GFR calc Af Amer: >90 mL/min (ref >90)
GLUCOSE: 95 mg/dL (ref 70–99)
GLUCOSE: 110 mg/dL — AB (ref 70–99)
POTASSIUM: 5.6 meq/L — AB (ref 3.7–5.3)
POTASSIUM: 3.9 meq/L (ref 3.7–5.3)
SODIUM: 138 meq/L (ref 137–147)
SODIUM: 139 meq/L (ref 137–147)

## 2014-04-28 LAB — CSF CULTURE

## 2014-04-28 LAB — CSF CULTURE W GRAM STAIN: Culture: NO GROWTH

## 2014-04-28 MED ORDER — DOXYCYCLINE HYCLATE 100 MG PO TABS
100.0000 mg | ORAL_TABLET | Freq: Two times a day (BID) | ORAL | Status: DC
  Administered 2014-04-28: 100 mg via ORAL
  Filled 2014-04-28 (×2): qty 1

## 2014-04-28 MED ORDER — FUROSEMIDE 20 MG PO TABS: 20.0000 mg | ORAL_TABLET | Freq: Every day | ORAL | Status: DC

## 2014-04-28 MED ORDER — POTASSIUM CHLORIDE ER 20 MEQ PO TBCR: 20.0000 meq | EXTENDED_RELEASE_TABLET | Freq: Every day | ORAL | Status: DC

## 2014-04-28 MED ORDER — FUROSEMIDE 20 MG PO TABS
20.0000 mg | ORAL_TABLET | Freq: Every day | ORAL | Status: DC
  Administered 2014-04-28: 20 mg via ORAL
  Filled 2014-04-28: qty 1

## 2014-04-28 MED ORDER — DOXYCYCLINE HYCLATE 100 MG PO TABS: 100.0000 mg | ORAL_TABLET | Freq: Two times a day (BID) | ORAL | Status: DC

## 2014-04-28 MED ORDER — FERROUS SULFATE 325 (65 FE) MG PO TABS: 325.0000 mg | ORAL_TABLET | Freq: Three times a day (TID) | ORAL | Status: DC

## 2014-04-28 NOTE — Discharge Summary (Signed)
Physician Discharge Summary  Brenda [name] KPT:[id number] DOB: 1986-06-24 DOA: 04/24/2014  PCP: PROVIDER NOT IN SYSTEM  Admit date: 04/24/2014 Discharge date: 04/28/2014  Time spent: 35 minutes  Recommendations for Outpatient Follow-up:  1. Might need to be refer to lymphedema clinic for chronic swelling of right LE.  2. Needs b-met to follow up electrolytes.  3. Needs cbc to follow hb in setting iron deficiency.  4. Follow up on resolution of right LE cellulitis.   Discharge Diagnoses:    SIRS (systemic inflammatory response syndrome)   Cellulitis of leg, right   Iron deficiency.    Discharge Condition: Stable.   Diet recommendation: Heart healthy  Filed Weights   04/26/14 0431 04/27/14 0537 04/28/14 0500  Weight: 128.368 kg (283 lb) 128.731 kg (283 lb 12.8 oz) 126.463 kg (278 lb 12.8 oz)    History of present illness:  Brenda [name] is a 28 y.o. female previously healthy other than anemia, presents to the ED with fever, weakness, headache, and right thigh pain. Symptoms onset at 7am. Movement does not make the thigh pain any worse. Quality of the thigh pain is described as cramping. Patient has had nausea and vomiting once today and 1 episode of vomiting yesterday. Emesis is NBNB, no diarrhea. BM without melena or blood. No neck pain nor neck stiffness. Headache is throbbing, better when lying down and worse when moving around.  Fever as high as 103 noted in ED, tachycardic to 150s which came down to 110s by time of admission. WBC 30k, tachypnic to the upper 20s. CXR negative, UA negative, patient given 2gm rocephin in ED empirically for possible bacterial meningitis and LP obtained results are pending.   Hospital Course:  1-SIRS; secondary to Right LE cellulitis. Patient was admitted with fevers, leukocytosis, Tachycardia. Initially no source of infection was identified. She developed redness of LE within 24 to 48 hours after admission.  UA negative, Chest X-ray negative, She  underwent LP. CSF with only 1 WBC. Culture negative up to date. Ct abdomen and pelvis negative for source of infection. Within 24- 48 hours she developed redness of right LE. HIV non reactive Blood culture no growth to date. She received  vancomycin and ceftriaxone for 3 days.  WBC 24---12   2-Right LE cellulitis; WBC trending down. Received vancomycin and ceftriaxone. Doppler negative for DVT. X ray with soft tissue swelling no bone affection. Swelling improved after IV lasix. Will provide 3 more days of oral lasix. She will be discharge on Doxycycline for 7 more days. She has chronic swelling oof right LE, for years I have advised her to follow up with lymphedema clinic for evaluation.   Headache; Resolved. CT head negative.  -Anemia: Anemia panel consistent with iron deficiency. Iron at 10, ferritin 58. She was started Ferrous sulfate. . Needs further outpatient evaluation.    Procedures:  Doppler LE negative for DVT.   Consultations:  none  Discharge Exam: Filed Vitals:   04/28/14 0546  BP: 135/60  Pulse: 61  Temp: 98.3 F (36.8 C)  Resp: 18    General: No distress.  Cardiovascular: S 1, S 2 RRR Respiratory: CTA Right LE with less redness, less edema.   Discharge Instructions You were cared for by a hospitalist during your hospital stay. If you have any questions about your discharge medications or the care you received while you were in the hospital after you are discharged, you can call the unit and asked to speak with the hospitalist on call if  the hospitalist that took care of you is not available. Once you are discharged, your primary care physician will handle any further medical issues. Please note that NO REFILLS for any discharge medications will be authorized once you are discharged, as it is imperative that you return to your primary care physician (or establish a relationship with a primary care physician if you do not have one) for your aftercare needs so that they  can reassess your need for medications and monitor your lab values.     Medication List         B-12 PO  Take 1 tablet by mouth daily. Pt does not know strength; gets OTC     Biotin 5000 MCG Tabs  Take 5,000 Units by mouth daily.     doxycycline 100 MG tablet  Commonly known as:  VIBRA-TABS  Take 1 tablet (100 mg total) by mouth every 12 (twelve) hours.     ferrous sulfate 325 (65 FE) MG tablet  Take 1 tablet (325 mg total) by mouth 3 (three) times daily with meals.     furosemide 20 MG tablet  Commonly known as:  LASIX  Take 1 tablet (20 mg total) by mouth daily.     Potassium Chloride ER 20 MEQ Tbcr  Take 20 mEq by mouth daily.       Allergies  Allergen Reactions  . Oxycodone Hcl Hives  . Latex Itching      The results of significant diagnostics from this hospitalization (including imaging, microbiology, ancillary and laboratory) are listed below for reference.    Significant Diagnostic Studies: Dg Chest 2 View  04/24/2014   CLINICAL DATA:  Fever, headache, weakness  EXAM: CHEST  2 VIEW  COMPARISON:  07/29/2011  FINDINGS: Cardiomediastinal silhouette is stable. No acute infiltrate or pleural effusion. No pulmonary edema. Bony thorax is unremarkable.  IMPRESSION: No active cardiopulmonary disease.   Electronically Signed   By: Lahoma Crocker M.D.   On: 04/24/2014 16:24   Ct Head Wo Contrast  04/24/2014   CLINICAL DATA:  Fever, weakness, headache.  EXAM: CT HEAD WITHOUT CONTRAST  TECHNIQUE: Contiguous axial images were obtained from the base of the skull through the vertex without contrast.  COMPARISON:  None  FINDINGS: Normal appearance of the intracranial structures. No evidence for acute hemorrhage, mass lesion, midline shift, hydrocephalus or large infarct. Symmetric appearing temporal lobes without areas of low attenuation. No acute bony abnormality. The visualized sinuses are free of acute fluid. Minor retention cyst in the dependent LEFT maxillary sinus. No mastoid or  middle ear fluid. Negative orbits.  IMPRESSION: No acute intracranial abnormality.  Negative exam.   Electronically Signed   By: Rolla Flatten M.D.   On: 04/24/2014 21:58   Ct Abdomen Pelvis W Contrast  04/25/2014   CLINICAL DATA:  28 year old female fever, leukocytosis and nausea and vomiting.  EXAM: CT ABDOMEN AND PELVIS WITH CONTRAST  TECHNIQUE: Multidetector CT imaging of the abdomen and pelvis was performed using the standard protocol following bolus administration of intravenous contrast.  CONTRAST:  178m OMNIPAQUE IOHEXOL 300 MG/ML  SOLN  COMPARISON:  None.  FINDINGS: Cardiomegaly identified.  The liver, gallbladder, spleen, pancreas, adrenal glands and kidneys are unremarkable.  There is no evidence of enlarged lymph nodes, biliary dilation or abdominal aortic aneurysm.  A small amount free pelvic fluid may be physiologic.  The bowel, bladder and appendix are unremarkable. There is no evidence of bowel obstruction, abscess or pneumoperitoneum.  No acute or suspicious bony abnormalities  are present.  IMPRESSION: Small amount of free pelvic fluid which may be physiologic.  No other acute abnormalities identified.  Cardiomegaly.   Electronically Signed   By: Hassan Rowan M.D.   On: 04/25/2014 16:14   Dg Foot Complete Right  04/26/2014   CLINICAL DATA:  Pain, redness, itching and swelling of the right foot for the past 2 days.  EXAM: RIGHT FOOT COMPLETE - 3+ VIEW  COMPARISON:  04/26/2010.  FINDINGS: Soft tissue swelling around the right foot diffusely (most severe around the midfoot). No acute displaced fracture, subluxation or dislocation. No aggressive appearing bony lesion.  IMPRESSION: 1. Diffuse soft tissue swelling most evident overlying the midfoot. No underlying acute bony abnormality.   Electronically Signed   By: Vinnie Langton M.D.   On: 04/26/2014 17:31   Dg Fluoro Guide Lumbar Puncture  04/24/2014   CLINICAL DATA:  Headache, fever, concern for meningitis  EXAM: DIAGNOSTIC LUMBAR PUNCTURE UNDER  FLUOROSCOPIC GUIDANCE  FLUOROSCOPY TIME:  1 min 46 seconds  PROCEDURE: Informed consent was obtained from the patient prior to the procedure, including potential complications of headache, allergy, and pain. With the patient prone, the lower back was prepped with Betadine. 1% Lidocaine was used for local anesthesia. Lumbar puncture was performed at the L4-5 level using a 20 gauge needle with return of clear CSF . 21m of CSF were obtained for laboratory studies. The patient tolerated the procedure well and there were no apparent complications.  IMPRESSION: Successful lumbar puncture under fluoroscopic guidance. Due to equipment failure, images could not be saved.   Electronically Signed   By: RSkipper ClicheM.D.   On: 04/24/2014 23:11    Microbiology: Recent Results (from the past 240 hour(s))  CULTURE, BLOOD (ROUTINE X 2)     Status: None   Collection Time    04/24/14  6:20 PM      Result Value Ref Range Status   Specimen Description BLOOD ARM RIGHT   Final   Special Requests BOTTLES DRAWN AEROBIC ONLY 10CC   Final   Culture  Setup Time     Final   Value: 04/25/2014 00:40     Performed at [facility]  Culture     Final   Value:        BLOOD CULTURE RECEIVED NO GROWTH TO DATE CULTURE WILL BE HELD FOR 5 DAYS BEFORE ISSUING A FINAL NEGATIVE REPORT     Performed at [facility]  Report Status PENDING   Incomplete  CULTURE, BLOOD (ROUTINE X 2)     Status: None   Collection Time    04/24/14  6:35 PM      Result Value Ref Range Status   Specimen Description BLOOD ARM RIGHT   Final   Special Requests BOTTLES DRAWN AEROBIC ONLY 10CC   Final   Culture  Setup Time     Final   Value: 04/25/2014 00:40     Performed at [facility]  Culture     Final   Value:        BLOOD CULTURE RECEIVED NO GROWTH TO DATE CULTURE WILL BE HELD FOR 5 DAYS BEFORE ISSUING A FINAL NEGATIVE REPORT     Performed at [facility]  Report Status PENDING   Incomplete  CSF CULTURE      Status: None   Collection Time    04/24/14 10:07 PM      Result Value Ref Range Status   Specimen Description CSF  Final   Special Requests NO 2 2CC   Final   Gram Stain     Final   Value: CYTOSPIN WBC PRESENT,BOTH PMN AND MONONUCLEAR     NO ORGANISMS SEEN     Performed at [facility]     Performed at [facility]   Culture     Final   Value: NO GROWTH 3 DAYS     Performed at [facility]   Report Status 04/28/2014 FINAL   Final  GRAM STAIN     Status: None   Collection Time    04/24/14 10:07 PM      Result Value Ref Range Status   Specimen Description CSF   Final   Special Requests NO 2 2CC   Final   Gram Stain     Final   Value: CYTOSPIN     WBC PRESENT,BOTH PMN AND MONONUCLEAR     NO ORGANISMS SEEN   Report Status 04/25/2014 FINAL   Final  URINE CULTURE     Status: None   Collection Time    04/25/14 10:30 AM      Result Value Ref Range Status   Specimen Description URINE, CLEAN CATCH   Final   Special Requests NONE   Final   Culture  Setup Time     Final   Value: 04/25/2014 21:51     Performed at [place]     Final   Value: NO GROWTH     Performed at [facility]   Culture     Final   Value: NO GROWTH     Performed at [facility]   Report Status 04/26/2014 FINAL   Final     Labs: Basic Metabolic Panel:  Recent Labs Lab 04/25/14 0130 04/26/14 0455 04/27/14 0405 04/28/14 0535 04/28/14 0952  NA 140 138 139 138 139  K 3.6* 4.1 3.9 5.6* 3.9  CL 103 103 103 102 103  CO2 _0 17* 25  GLUCOSE 120* 120* 117* 95 110*  BUN _1 CREATININE 0.75 0.73 0.69 0.71 0.76  CALCIUM 8.4 8.6 9.0 9.2 8.9   Liver Function Tests:  Recent Labs Lab 04/24/14 1525  AST 24  ALT 15  ALKPHOS 75  BILITOT 0.4  PROT 8.0  ALBUMIN 3.9   No results found for this basename: LIPASE, AMYLASE,  in the last 168 hours No results found for this basename: AMMONIA,  in the last 168 hours CBC:  Recent  Labs Lab 04/24/14 1525 04/25/14 0130 04/25/14 1438 04/26/14 0950 04/27/14 0405  WBC 30.1* 24.6* 17.0* 12.7* 11.1*  NEUTROABS 26.8*  --  12.8*  --   --   HGB 10.1* 9.6* 9.1* 9.2* 9.9*  HCT 32.8* 30.6* 29.8* 30.2* 32.3*  MCV 57.4* 57.1* 57.5* 57.6* 58.5*  PLT 322 295 263 259 269   Cardiac Enzymes:  Recent Labs Lab 04/25/14 1438 04/26/14 0850  CKTOTAL 234* 206*   BNP: BNP (last 3 results) No results found for this basename: PROBNP,  in the last 8760 hours CBG: No results found for this basename: GLUCAP,  in the last 168 hours     Signed:  Niel Hummer A  [facility] 04/28/2014, 11:32 AM

## 2014-05-01 LAB — CULTURE, BLOOD (ROUTINE X 2)
CULTURE: NO GROWTH
Culture: NO GROWTH

## 2014-07-20 ENCOUNTER — Encounter (HOSPITAL_COMMUNITY): Payer: Self-pay | Admitting: *Deleted

## 2014-09-14 ENCOUNTER — Encounter: Payer: Self-pay | Admitting: *Deleted

## 2014-09-22 ENCOUNTER — Encounter (HOSPITAL_COMMUNITY): Payer: Self-pay | Admitting: *Deleted

## 2014-09-22 ENCOUNTER — Inpatient Hospital Stay (HOSPITAL_COMMUNITY)
Admission: AD | Admit: 2014-09-22 | Discharge: 2014-09-22 | Discharge status: Against Medical Advice | Payer: Medicaid Other | Source: Ambulatory Visit | Attending: Obstetrics & Gynecology | Admitting: Obstetrics & Gynecology

## 2014-09-22 ENCOUNTER — Inpatient Hospital Stay (HOSPITAL_COMMUNITY): Payer: Medicaid Other

## 2014-09-22 ENCOUNTER — Inpatient Hospital Stay (HOSPITAL_COMMUNITY)
Admission: AD | Admit: 2014-09-22 | Discharge: 2014-09-22 | Disposition: A | Discharge status: Home / Self Care | Payer: Medicaid Other | Source: Ambulatory Visit | Attending: Obstetrics & Gynecology | Admitting: Obstetrics & Gynecology

## 2014-09-22 DIAGNOSIS — Z3A18 18 weeks gestation of pregnancy: Secondary | ICD-10-CM | POA: Insufficient documentation

## 2014-09-22 DIAGNOSIS — O9989 Other specified diseases and conditions complicating pregnancy, childbirth and the puerperium: Secondary | ICD-10-CM | POA: Diagnosis not present

## 2014-09-22 DIAGNOSIS — O26892 Other specified pregnancy related conditions, second trimester: Secondary | ICD-10-CM

## 2014-09-22 DIAGNOSIS — R102 Pelvic and perineal pain: Secondary | ICD-10-CM

## 2014-09-22 DIAGNOSIS — Z3201 Encounter for pregnancy test, result positive: Secondary | ICD-10-CM | POA: Insufficient documentation

## 2014-09-22 DIAGNOSIS — R109 Unspecified abdominal pain: Secondary | ICD-10-CM | POA: Insufficient documentation

## 2014-09-22 DIAGNOSIS — N926 Irregular menstruation, unspecified: Secondary | ICD-10-CM | POA: Insufficient documentation

## 2014-09-22 DIAGNOSIS — N949 Unspecified condition associated with female genital organs and menstrual cycle: Secondary | ICD-10-CM | POA: Diagnosis not present

## 2014-09-22 LAB — URINALYSIS, ROUTINE W REFLEX MICROSCOPIC
Bilirubin Urine: NEGATIVE
Glucose, UA: NEGATIVE mg/dL
Hgb urine dipstick: NEGATIVE
Ketones, ur: NEGATIVE mg/dL
NITRITE: NEGATIVE
PH: 6 (ref 5.0–8.0)
PROTEIN: NEGATIVE mg/dL
Specific Gravity, Urine: >1.03 — ABNORMAL HIGH (ref 1.005–1.030)
Urobilinogen, UA: 0.2 mg/dL (ref 0.0–1.0)

## 2014-09-22 LAB — WET PREP, GENITAL
Trich, Wet Prep: NONE SEEN
YEAST WET PREP: NONE SEEN

## 2014-09-22 LAB — URINE MICROSCOPIC-ADD ON

## 2014-09-22 LAB — POCT PREGNANCY, URINE: Preg Test, Ur: POSITIVE — AB

## 2014-09-22 NOTE — Progress Notes (Signed)
Pt instructed to wait in waiting area for results

## 2014-09-22 NOTE — MAU Note (Signed)
irreg cycles, ? PCOS

## 2014-09-22 NOTE — MAU Note (Signed)
Pt very surprised with news of preg

## 2014-09-22 NOTE — MAU Note (Signed)
Pt asking about results, call place to lab. Was informed by lab, Sam, that results would be available shortly

## 2014-09-22 NOTE — MAU Note (Signed)
Been going on for about 2-3 days.  When holds pee, it will start to hurt, them when goes feels a  Pressure/pain, no pain afterwards.  Noted a d/c

## 2014-09-22 NOTE — MAU Provider Note (Signed)
Chief Complaint: Abdominal Pain  First Provider Initiated Contact with Patient 09/22/14 1823     SUBJECTIVE HPI: Brenda [name] is a 29 y.o. [id number] at [redacted]w[redacted]d by uncertain LMP who presents with pelvic pressure especially when holding her urine x 1 week, worse over the past 3 days. Didn't know she was pregnant. Usually has cycles every three months. Denies fever, chills, dysuria, frequency, hematuria, GI complaints, VB. Pos vaginal discharge.   Hx 26 week PTD, spontaneous labor.   Past Medical History  Diagnosis Date  . Anemia   . Asthma   . Abnormal Pap smear    OB History  Gravida Para Term Preterm AB SAB TAB Ectopic Multiple Living  3 1  1 1 1    1     # Outcome Date GA Lbr Len/2nd Weight Sex Delivery Anes PTL Lv  3 Current           2 Preterm 08/17/09 [redacted]w[redacted]d   F CS-LTranv Spinal Y Y     Comments: transverse  1 SAB 2003             Past Surgical History  Procedure Laterality Date  . Cesarean section      2010   History   Social History  . Marital Status: Single    Spouse Name: N/A    Number of Children: N/A  . Years of Education: N/A   Occupational History  . Not on file.   Social History Main Topics  . Smoking status: Never Smoker   . Smokeless tobacco: Not on file  . Alcohol Use: Yes     Comment: occaisionally  . Drug Use: No  . Sexual Activity: Yes    Birth Control/ Protection: None, Pill     Comment: ran out of pills on Monday   Other Topics Concern  . Not on file   Social History Narrative   No current facility-administered medications on file prior to encounter.   Current Outpatient Prescriptions on File Prior to Encounter  Medication Sig Dispense Refill  . Biotin 5000 MCG TABS Take 5,000 Units by mouth daily.    Marland Kitchen doxycycline (VIBRA-TABS) 100 MG tablet Take 1 tablet (100 mg total) by mouth every 12 (twelve) hours. (Patient not taking: Reported on 09/22/2014) 14 tablet 0  . ferrous sulfate 325 (65 FE) MG tablet Take 1 tablet (325 mg total) by mouth 3  (three) times daily with meals. (Patient not taking: Reported on 09/22/2014) 60 tablet 3  . furosemide (LASIX) 20 MG tablet Take 1 tablet (20 mg total) by mouth daily. (Patient not taking: Reported on 09/22/2014) 3 tablet 0  . potassium chloride 20 MEQ TBCR Take 20 mEq by mouth daily. (Patient not taking: Reported on 09/22/2014) 3 tablet 0   Allergies  Allergen Reactions  . Oxycodone Hcl Hives  . Latex Itching    ROS: Pertinent items in HPI. Neg fever, chills, dysuria, frequency, hematuria, GI complaints, VB.  OBJECTIVE Blood pressure 153/84, pulse 90, temperature 98.8 F (37.1 C), temperature source Oral, resp. rate 18, last menstrual period 05/19/2014. GENERAL: Well-developed, well-nourished female in no acute distress.  HEENT: Normocephalic HEART: normal rate RESP: normal effort ABDOMEN: Soft, Mild SP tenderness. Fundal height ~16 weeks.  EXTREMITIES: Nontender, no edema NEURO: Alert and oriented SPECULUM EXAM: NEFG, moderate amount of this, white, malodorous discharge, no blood noted, cervix friable.  BIMANUAL: cervix FT, long; uterus ~17 week size, no adnexal tenderness or masses Pos FHTs by doppler.   LAB RESULTS Results for orders  placed or performed during the hospital encounter of 09/22/14 (from the past 24 hour(s))  Wet prep, genital     Status: Abnormal   Collection Time: 09/22/14  7:45 PM  Result Value Ref Range   Yeast Wet Prep HPF POC NONE SEEN NONE SEEN   Trich, Wet Prep NONE SEEN NONE SEEN   Clue Cells Wet Prep HPF POC FEW (A) NONE SEEN   WBC, Wet Prep HPF POC MANY (A) NONE SEEN    MAU COURSE Care of pt turned over to [employer] at 8:00 pm. Results pending.  [place]: cervix: 3.9 cm EDC: 02/23/15  Assessment/Plan  1. Pelvic pressure in pregnancy, antepartum, second trimester   2. Irregular menstrual cycle    Second trimester precautions reviewed Return to MAU as needed Start [facility] as soon as possible  Follow-up Information    Please follow up.   Contact  information:   The OB of your choice        Mathis Bud, [place] 09/22/2014  8:29 PM

## 2014-09-22 NOTE — MAU Note (Signed)
Returned, had to leave and pick up child from school.cramping in lower abd.  Just found out preg earlier today here.

## 2014-09-22 NOTE — Discharge Instructions (Signed)
Second Trimester of Pregnancy The second trimester is from week 13 through week 28, months 4 through 6. The second trimester is often a time when you feel your best. Your body has also adjusted to being pregnant, and you begin to feel better physically. Usually, morning sickness has lessened or quit completely, you may have more energy, and you may have an increase in appetite. The second trimester is also a time when the fetus is growing rapidly. At the end of the sixth month, the fetus is about 9 inches long and weighs about 1 pounds. You will likely begin to feel the baby move (quickening) between 18 and 20 weeks of the pregnancy. BODY CHANGES Your body goes through many changes during pregnancy. The changes vary from woman to woman.   Your weight will continue to increase. You will notice your lower abdomen bulging out.  You may begin to get stretch marks on your hips, abdomen, and breasts.  You may develop headaches that can be relieved by medicines approved by your health care provider.  You may urinate more often because the fetus is pressing on your bladder.  You may develop or continue to have heartburn as a result of your pregnancy.  You may develop constipation because certain hormones are causing the muscles that push waste through your intestines to slow down.  You may develop hemorrhoids or swollen, bulging veins (varicose veins).  You may have back pain because of the weight gain and pregnancy hormones relaxing your joints between the bones in your pelvis and as a result of a shift in weight and the muscles that support your balance.  Your breasts will continue to grow and be tender.  Your gums may bleed and may be sensitive to brushing and flossing.  Dark spots or blotches (chloasma, mask of pregnancy) may develop on your face. This will likely fade after the baby is born.  A dark line from your belly button to the pubic area (linea nigra) may appear. This will likely fade  after the baby is born.  You may have changes in your hair. These can include thickening of your hair, rapid growth, and changes in texture. Some women also have hair loss during or after pregnancy, or hair that feels dry or thin. Your hair will most likely return to normal after your baby is born. WHAT TO EXPECT AT YOUR PRENATAL VISITS During a routine prenatal visit:  You will be weighed to make sure you and the fetus are growing normally.  Your blood pressure will be taken.  Your abdomen will be measured to track your baby's growth.  The fetal heartbeat will be listened to.  Any test results from the previous visit will be discussed. Your health care provider may ask you:  How you are feeling.  If you are feeling the baby move.  If you have had any abnormal symptoms, such as leaking fluid, bleeding, severe headaches, or abdominal cramping.  If you have any questions. Other tests that may be performed during your second trimester include:  Blood tests that check for:  Low iron levels (anemia).  Gestational diabetes (between 24 and 28 weeks).  Rh antibodies.  Urine tests to check for infections, diabetes, or protein in the urine.  An ultrasound to confirm the proper growth and development of the baby.  An amniocentesis to check for possible genetic problems.  Fetal screens for spina bifida and Down syndrome. HOME CARE INSTRUCTIONS   Avoid all smoking, herbs, alcohol, and unprescribed   drugs. These chemicals affect the formation and growth of the baby.  Follow your health care provider's instructions regarding medicine use. There are medicines that are either safe or unsafe to take during pregnancy.  Exercise only as directed by your health care provider. Experiencing uterine cramps is a good sign to stop exercising.  Continue to eat regular, healthy meals.  Wear a good support bra for breast tenderness.  Do not use hot tubs, steam rooms, or saunas.  Wear your  seat belt at all times when driving.  Avoid raw meat, uncooked cheese, cat litter boxes, and soil used by cats. These carry germs that can cause birth defects in the baby.  Take your prenatal vitamins.  Try taking a stool softener (if your health care provider approves) if you develop constipation. Eat more high-fiber foods, such as fresh vegetables or fruit and whole grains. Drink plenty of fluids to keep your urine clear or pale yellow.  Take warm sitz baths to soothe any pain or discomfort caused by hemorrhoids. Use hemorrhoid cream if your health care provider approves.  If you develop varicose veins, wear support hose. Elevate your feet for 15 minutes, 3-4 times a day. Limit salt in your diet.  Avoid heavy lifting, wear low heel shoes, and practice good posture.  Rest with your legs elevated if you have leg cramps or low back pain.  Visit your dentist if you have not gone yet during your pregnancy. Use a soft toothbrush to brush your teeth and be gentle when you floss.  A sexual relationship may be continued unless your health care provider directs you otherwise.  Continue to go to all your prenatal visits as directed by your health care provider. SEEK MEDICAL CARE IF:   You have dizziness.  You have mild pelvic cramps, pelvic pressure, or nagging pain in the abdominal area.  You have persistent nausea, vomiting, or diarrhea.  You have a bad smelling vaginal discharge.  You have pain with urination. SEEK IMMEDIATE MEDICAL CARE IF:   You have a fever.  You are leaking fluid from your vagina.  You have spotting or bleeding from your vagina.  You have severe abdominal cramping or pain.  You have rapid weight gain or loss.  You have shortness of breath with chest pain.  You notice sudden or extreme swelling of your face, hands, ankles, feet, or legs.  You have not felt your baby move in over an hour.  You have severe headaches that do not go away with  medicine.  You have vision changes. Document Released: 08/29/2001 Document Revised: 09/09/2013 Document Reviewed: 11/05/2012 ExitCare Patient Information 2015 [employer]. This information is not intended to replace advice given to you by your health care provider. Make sure you discuss any questions you have with your health care provider.  

## 2014-09-22 NOTE — MAU Note (Signed)
Pt states she has been having stomach discomfort for about 1 week.Pt states she has had an on and off pain. Pt states she found out that she is pregnant

## 2014-09-23 ENCOUNTER — Encounter (HOSPITAL_COMMUNITY): Payer: Self-pay | Admitting: Advanced Practice Midwife

## 2014-09-23 LAB — GC/CHLAMYDIA PROBE AMP
CT Probe RNA: NEGATIVE
GC Probe RNA: NEGATIVE

## 2014-09-23 LAB — CULTURE, OB URINE
Colony Count: 7000
SPECIAL REQUESTS: NORMAL

## 2014-09-24 ENCOUNTER — Other Ambulatory Visit: Payer: Self-pay

## 2014-09-30 ENCOUNTER — Inpatient Hospital Stay (HOSPITAL_COMMUNITY)
Admission: AD | Admit: 2014-09-30 | Discharge: 2014-09-30 | Disposition: A | Discharge status: Home / Self Care | Payer: Medicaid Other | Source: Ambulatory Visit | Attending: Obstetrics and Gynecology | Admitting: Obstetrics and Gynecology

## 2014-09-30 ENCOUNTER — Encounter (HOSPITAL_COMMUNITY): Payer: Self-pay | Admitting: *Deleted

## 2014-09-30 DIAGNOSIS — O9989 Other specified diseases and conditions complicating pregnancy, childbirth and the puerperium: Secondary | ICD-10-CM | POA: Insufficient documentation

## 2014-09-30 DIAGNOSIS — N949 Unspecified condition associated with female genital organs and menstrual cycle: Secondary | ICD-10-CM | POA: Insufficient documentation

## 2014-09-30 DIAGNOSIS — Z3A18 18 weeks gestation of pregnancy: Secondary | ICD-10-CM | POA: Diagnosis not present

## 2014-09-30 DIAGNOSIS — R109 Unspecified abdominal pain: Secondary | ICD-10-CM | POA: Diagnosis present

## 2014-09-30 LAB — URINALYSIS, ROUTINE W REFLEX MICROSCOPIC
BILIRUBIN URINE: NEGATIVE
GLUCOSE, UA: NEGATIVE mg/dL
HGB URINE DIPSTICK: NEGATIVE
Ketones, ur: NEGATIVE mg/dL
Nitrite: NEGATIVE
PH: 6.5 (ref 5.0–8.0)
Protein, ur: NEGATIVE mg/dL
Specific Gravity, Urine: 1.02 (ref 1.005–1.030)
Urobilinogen, UA: 0.2 mg/dL (ref 0.0–1.0)

## 2014-09-30 LAB — URINE MICROSCOPIC-ADD ON

## 2014-09-30 NOTE — MAU Note (Addendum)
Till feeling pelvic pain and pressure, concerned due to hx of Preterm delivery.  When stands up- the pain comes (sounds like round ligament pain).

## 2014-09-30 NOTE — MAU Provider Note (Signed)
  History     CSN: [id number]  Arrival date and time: 09/30/14 1527   First Provider Initiated Contact with Patient 09/30/14 1745      Chief Complaint  Patient presents with  . Pelvic Pain   HPI Comments: Brenda [name] 29 y.o. [id number] [redacted]w[redacted]d presents to the MAU with complaint of abdominal pain, stretching with standing. States she has had this pain for 2 weeks .The pain goes away if she holds her abdomen up with her hands. Pt has a history of preterm delivery at 26 weeks. She was evaluated 1 week ago for same complaint. CL 3.9cm. Urine wnl.  Denies vaginal discharge, LOF. Plans care at [facility].      Pelvic Pain      Past Medical History  Diagnosis Date  . Anemia   . Asthma   . Abnormal Pap smear     Past Surgical History  Procedure Laterality Date  . Cesarean section      2010    Family History  Problem Relation Age of Onset  . Stroke Mother   . Diabetes Mother   . Hypertension Mother   . Coronary artery disease Mother   . Hypertension Father     History  Substance Use Topics  . Smoking status: Never Smoker   . Smokeless tobacco: Not on file  . Alcohol Use: Yes     Comment: occaisionally    Allergies:  Allergies  Allergen Reactions  . Oxycodone Hcl Hives  . Latex Itching    Prescriptions prior to admission  Medication Sig Dispense Refill Last Dose  . Biotin 5000 MCG TABS Take 5,000 Units by mouth daily.   Past Month at Unknown time  . Prenatal Vit-Fe Fumarate-FA (PRENATAL MULTIVITAMIN) TABS tablet Take 1 tablet by mouth daily at 12 noon.   09/30/2014 at Unknown time  . ferrous sulfate 325 (65 FE) MG tablet Take 1 tablet (325 mg total) by mouth 3 (three) times daily with meals. (Patient not taking: Reported on 09/22/2014) 60 tablet 3 Not Taking at Unknown time    Review of Systems  Constitutional: Negative.   HENT: Negative.   Eyes: Negative.   Respiratory: Negative.   Cardiovascular: Negative.   Gastrointestinal: Negative.        Lower abdominal  pain when standing  Genitourinary: Negative.   Musculoskeletal: Negative.   Skin: Negative.   Neurological: Negative.   Endo/Heme/Allergies: Negative.   Psychiatric/Behavioral: Negative.    Physical Exam   Blood pressure 132/82, pulse 87, temperature 98 F (36.7 C), temperature source Oral, resp. rate 18, weight 125.193 kg (276 lb), last menstrual period 05/19/2014.  Physical Exam  Constitutional: She is oriented to person, place, and time. She appears well-developed and well-nourished.  HENT:  Head: Normocephalic.  GI: Soft. She exhibits no distension and no mass. There is no tenderness. There is no rebound and no guarding.  Musculoskeletal: Normal range of motion.  Neurological: She is alert and oriented to person, place, and time.  Skin: Skin is warm and dry.  Psychiatric: She has a normal mood and affect.    MAU Course  Procedures  MDM  Assessment and Plan  A Round Ligament Pain P: Obtain abdominal pregnancy binder for comfort Tylenol for discomfort Warm bath for comfort Pt plans to establish care with [facility] . Appointment on Friday  Georgia Duff 09/30/2014, 6:25 PM

## 2014-09-30 NOTE — Discharge Instructions (Signed)
Abdominal Pain During Pregnancy °Belly (abdominal) pain is common during pregnancy. Most of the time, it is not a serious problem. Other times, it can be a sign that something is wrong with the pregnancy. Always tell your doctor if you have belly pain. °HOME CARE °Monitor your belly pain for any changes. The following actions may help you feel better: °· Do not have sex (intercourse) or put anything in your vagina until you feel better. °· Rest until your pain stops. °· Drink clear fluids if you feel sick to your stomach (nauseous). Do not eat solid food until you feel better. °· Only take medicine as told by your doctor. °· Keep all doctor visits as told. °GET HELP RIGHT AWAY IF:  °· You are bleeding, leaking fluid, or pieces of tissue come out of your vagina. °· You have more pain or cramping. °· You keep throwing up (vomiting). °· You have pain when you pee (urinate) or have blood in your pee. °· You have a fever. °· You do not feel your baby moving as much. °· You feel very weak or feel like passing out. °· You have trouble breathing, with or without belly pain. °· You have a very bad headache and belly pain. °· You have fluid leaking from your vagina and belly pain. °· You keep having watery poop (diarrhea). °· Your belly pain does not go away after resting, or the pain gets worse. °MAKE SURE YOU:  °· Understand these instructions. °· Will watch your condition. °· Will get help right away if you are not doing well or get worse. °Document Released: 08/23/2009 Document Revised: 05/07/2013 Document Reviewed: 04/03/2013 °Exit[employer]® Patient Information ©2015 [employer]. This information is not intended to replace advice given to you by your health care provider. Make sure you discuss any questions you have with your health care provider. ° °

## 2014-10-02 LAB — OB RESULTS CONSOLE ABO/RH: RH Type: POSITIVE

## 2014-10-02 LAB — OB RESULTS CONSOLE RPR: RPR: NONREACTIVE

## 2014-10-02 LAB — OB RESULTS CONSOLE HEPATITIS B SURFACE ANTIGEN: HEP B S AG: NEGATIVE

## 2014-10-02 LAB — OB RESULTS CONSOLE ANTIBODY SCREEN: Antibody Screen: NEGATIVE

## 2014-10-02 LAB — OB RESULTS CONSOLE RUBELLA ANTIBODY, IGM: Rubella: IMMUNE

## 2014-10-02 LAB — OB RESULTS CONSOLE HIV ANTIBODY (ROUTINE TESTING): HIV: NONREACTIVE

## 2014-10-13 ENCOUNTER — Other Ambulatory Visit: Payer: Self-pay | Admitting: Obstetrics and Gynecology

## 2014-10-13 ENCOUNTER — Encounter (HOSPITAL_COMMUNITY): Payer: Self-pay | Admitting: General Practice

## 2014-10-14 ENCOUNTER — Ambulatory Visit (HOSPITAL_COMMUNITY): Payer: Medicaid Other | Admitting: Anesthesiology

## 2014-10-14 ENCOUNTER — Ambulatory Visit (HOSPITAL_COMMUNITY)
Admission: RE | Admit: 2014-10-14 | Discharge: 2014-10-14 | Disposition: A | Discharge status: Home / Self Care | Payer: Medicaid Other | Source: Ambulatory Visit | Attending: Obstetrics and Gynecology | Admitting: Obstetrics and Gynecology

## 2014-10-14 ENCOUNTER — Encounter (HOSPITAL_COMMUNITY)
Admission: RE | Disposition: A | Discharge status: Home / Self Care | Payer: Self-pay | Source: Ambulatory Visit | Attending: Obstetrics and Gynecology

## 2014-10-14 ENCOUNTER — Encounter (HOSPITAL_COMMUNITY): Payer: Self-pay | Admitting: *Deleted

## 2014-10-14 DIAGNOSIS — F1099 Alcohol use, unspecified with unspecified alcohol-induced disorder: Secondary | ICD-10-CM | POA: Diagnosis not present

## 2014-10-14 DIAGNOSIS — Z91040 Latex allergy status: Secondary | ICD-10-CM | POA: Diagnosis not present

## 2014-10-14 DIAGNOSIS — Y909 Presence of alcohol in blood, level not specified: Secondary | ICD-10-CM | POA: Insufficient documentation

## 2014-10-14 DIAGNOSIS — J45909 Unspecified asthma, uncomplicated: Secondary | ICD-10-CM | POA: Insufficient documentation

## 2014-10-14 DIAGNOSIS — N883 Incompetence of cervix uteri: Secondary | ICD-10-CM | POA: Diagnosis present

## 2014-10-14 DIAGNOSIS — Z6841 Body Mass Index (BMI) 40.0 and over, adult: Secondary | ICD-10-CM | POA: Diagnosis not present

## 2014-10-14 DIAGNOSIS — Z886 Allergy status to analgesic agent status: Secondary | ICD-10-CM | POA: Diagnosis not present

## 2014-10-14 HISTORY — PX: CERVICAL CERCLAGE: SHX1329

## 2014-10-14 LAB — CBC
HEMATOCRIT: 34.7 % — AB (ref 36.0–46.0)
Hemoglobin: 11.4 g/dL — ABNORMAL LOW (ref 12.0–15.0)
MCH: 20.9 pg — AB (ref 26.0–34.0)
MCHC: 32.9 g/dL (ref 30.0–36.0)
MCV: 63.6 fL — ABNORMAL LOW (ref 78.0–100.0)
PLATELETS: 247 10*3/uL (ref 150–400)
RBC: 5.46 MIL/uL — AB (ref 3.87–5.11)
RDW: 20 % — AB (ref 11.5–15.5)
WBC: 9.1 10*3/uL (ref 4.0–10.5)

## 2014-10-14 SURGERY — CERCLAGE, CERVIX, VAGINAL APPROACH
Anesthesia: Spinal | Site: Vagina

## 2014-10-14 MED ORDER — FENTANYL CITRATE 0.05 MG/ML IJ SOLN: 25.0000 ug | INTRAMUSCULAR | Status: DC | PRN

## 2014-10-14 MED ORDER — MIDAZOLAM HCL 2 MG/2ML IJ SOLN: 0.5000 mg | Freq: Once | INTRAMUSCULAR | Status: DC | PRN

## 2014-10-14 MED ORDER — SCOPOLAMINE 1 MG/3DAYS TD PT72
1.0000 | MEDICATED_PATCH | Freq: Once | TRANSDERMAL | Status: DC
  Administered 2014-10-14: 1.5 mg via TRANSDERMAL

## 2014-10-14 MED ORDER — BUPIVACAINE IN DEXTROSE 0.75-8.25 % IT SOLN
INTRATHECAL | Status: DC | PRN
  Administered 2014-10-14: 1.2 mL via INTRATHECAL

## 2014-10-14 MED ORDER — ACETAMINOPHEN 160 MG/5ML PO SOLN: 325.0000 mg | ORAL | Status: DC | PRN

## 2014-10-14 MED ORDER — ACETAMINOPHEN 325 MG PO TABS: 325.0000 mg | ORAL_TABLET | ORAL | Status: DC | PRN

## 2014-10-14 MED ORDER — SODIUM CHLORIDE 0.9 % IJ SOLN
Freq: Once | INTRAMUSCULAR | Status: AC
  Administered 2014-10-14: 30 mL via VAGINAL
  Filled 2014-10-14: qty 1

## 2014-10-14 MED ORDER — LACTATED RINGERS IV SOLN
INTRAVENOUS | Status: DC
  Administered 2014-10-14 (×2): via INTRAVENOUS

## 2014-10-14 MED ORDER — KETOROLAC TROMETHAMINE 30 MG/ML IJ SOLN: 30.0000 mg | Freq: Once | INTRAMUSCULAR | Status: DC | PRN

## 2014-10-14 MED ORDER — SCOPOLAMINE 1 MG/3DAYS TD PT72
MEDICATED_PATCH | TRANSDERMAL | Status: AC
  Filled 2014-10-14: qty 1

## 2014-10-14 MED ORDER — MEPERIDINE HCL 25 MG/ML IJ SOLN: 6.2500 mg | INTRAMUSCULAR | Status: DC | PRN

## 2014-10-14 MED ORDER — PROMETHAZINE HCL 25 MG/ML IJ SOLN: 6.2500 mg | INTRAMUSCULAR | Status: DC | PRN

## 2014-10-14 MED ORDER — HYDROCODONE-ACETAMINOPHEN 5-325 MG PO TABS: 1.0000 | ORAL_TABLET | Freq: Four times a day (QID) | ORAL | Status: DC | PRN

## 2014-10-14 MED ORDER — IBUPROFEN 600 MG PO TABS: 600.0000 mg | ORAL_TABLET | Freq: Four times a day (QID) | ORAL | Status: DC | PRN

## 2014-10-14 SURGICAL SUPPLY — 19 items
CLOTH BEACON ORANGE TIMEOUT ST (SAFETY) ×3 IMPLANT
COUNTER NEEDLE 1200 MAGNETIC (NEEDLE) IMPLANT
GLOVE BIO SURGEON STRL SZ7.5 (GLOVE) ×3 IMPLANT
GLOVE BIOGEL PI IND STRL 7.5 (GLOVE) ×1 IMPLANT
GLOVE BIOGEL PI INDICATOR 7.5 (GLOVE) ×2
GOWN STRL REUS W/TWL LRG LVL3 (GOWN DISPOSABLE) ×6 IMPLANT
NS IRRIG 1000ML POUR BTL (IV SOLUTION) ×3 IMPLANT
PACK VAGINAL MINOR WOMEN LF (CUSTOM PROCEDURE TRAY) ×3 IMPLANT
PAD OB MATERNITY 4.3X12.25 (PERSONAL CARE ITEMS) ×3 IMPLANT
PAD PREP 24X48 CUFFED NSTRL (MISCELLANEOUS) ×3 IMPLANT
SUT MERSILENE 5MM BP 1 12 (SUTURE) ×3 IMPLANT
SUT PROLENE 1 CT 1 30 (SUTURE) ×3 IMPLANT
SYR BULB IRRIGATION 50ML (SYRINGE) ×3 IMPLANT
TOWEL OR 17X24 6PK STRL BLUE (TOWEL DISPOSABLE) ×6 IMPLANT
TRAY FOLEY CATH 14FR (SET/KITS/TRAYS/PACK) ×3 IMPLANT
TUBING NON-CON 1/4 X 20 CONN (TUBING) IMPLANT
TUBING NON-CON 1/4 X 20' CONN (TUBING)
WATER STERILE IRR 1000ML POUR (IV SOLUTION) ×3 IMPLANT
YANKAUER SUCT BULB TIP NO VENT (SUCTIONS) IMPLANT

## 2014-10-14 NOTE — Progress Notes (Signed)
Assumed care of patient received report from [place]

## 2014-10-14 NOTE — Discharge Instructions (Signed)
Cervical Cerclage Cervical cerclage is a surgical procedure for an incompetent cervix. An incompetent cervix is a weak cervix that opens up before labor begins. Cervical cerclage is a procedure in which the cervix is sewn closed during pregnancy.  LET [facility] CARE PROVIDER KNOW ABOUT:   Any allergies you have.  All medicines you are taking, including vitamins, herbs, eye drops, creams, and over-the-counter medicines.  Previous problems you or members of your family have had with the use of anesthetics.  Any blood disorders you have.  Previous surgeries you have had.  Medical conditions you have.  Any recent colds or infections. RISKS AND COMPLICATIONS  Generally, this is a safe procedure. However, as with any procedure, problems can occur. Possible problems include:  Infection.  Bleeding.  Rupturing the amniotic sac (membranes).  Going into early labor and delivery.  Problems with the anesthetics.  Infection of the amniotic sac. BEFORE THE PROCEDURE   Ask your health care provider about changing or stopping your medicines.  Do not eat or drink anything for 6-8 hours before the procedure.  Arrange for someone to drive you home after the procedure. PROCEDURE   An IV tube will be placed in your vein. You will be given a sedative to help you relax.  You will be given a medicine that makes you sleep through the procedure (general anesthetic) or a medicine injected into your spine that numbs your body below the waist (spinal or epidural anesthetic). You will be asleep or be numbed through the entire procedure.  A speculum will be placed in your vagina to visualize your cervix.  The cervix is then grasped and stitched closed tightly.  Ultrasound may be used to guide the procedure and monitor the baby. AFTER THE PROCEDURE   You will go to a recovery room where you and your unborn baby are monitored. Once you are awake, stable, and taking fluids well, you will be allowed  to return to your room.  You will usually stay in the hospital overnight.  You may get an injection of progesterone to prevent uterine contractions.  You may be given pain-relieving medicines to take with you when you go home.  Have someone drive you home and stay with you for up to 2 days. Document Released: 08/17/2008 Document Revised: 09/09/2013 Document Reviewed: 03/26/2013 [facility] Patient Information 2015 [place], [place]. This information is not intended to replace advice given to you by your health care provider. Make sure you discuss any questions you have with your health care provider.  Cervical Cerclage, Care After Refer to this sheet in the next few weeks. These instructions provide you with information on caring for yourself after your procedure. Your health care provider may also give you more specific instructions. Your treatment has been planned according to current medical practices, but problems sometimes occur. Call your health care provider if you have any problems or questions after your procedure. WHAT TO EXPECT AFTER THE PROCEDURE  After your procedure, it is typical to have the following:  Abdominal cramping.  Vaginal spotting. HOME CARE INSTRUCTIONS   Only take over-the-counter or prescription medicines for pain, discomfort, or fever as directed by your health care provider.  Avoid physical activities and exercise until your health care provider says it is okay.  Do not douche or have sexual intercourse until your health care provider tells you it is okay.  Keep your follow-up surgical and prenatal appointments with your health care provider. SEEK MEDICAL CARE IF:   You have  abnormal vaginal discharge.  You have a rash.  You become lightheaded or feel faint.  You have abdominal pain that is not controlled with pain medicine. SEEK IMMEDIATE MEDICAL CARE IF:   You develop vaginal bleeding.  You are leaking fluid or have a gush of fluid from the  vagina.  You have a fever.  You faint.  You have uterine contractions.  You feel your baby is not moving as much as usual, or you cannot feel your baby move.  You have chest pain or shortness of breath. Document Released: 06/25/2013 Document Revised: 09/09/2013 Document Reviewed: 06/25/2013 [facility] Patient Information 2015 [place], [place]. This information is not intended to replace advice given to you by your health care provider. Make sure you discuss any questions you have with your health care provider.

## 2014-10-14 NOTE — Anesthesia Postprocedure Evaluation (Signed)
  Anesthesia Post Note  Patient: Brenda [name]  Procedure(s) Performed: Procedure(s) (LRB): CERCLAGE CERVICAL (N/A)  Anesthesia type: Spinal  Patient location: PACU  Post pain: Pain level controlled  Post assessment: Post-op Vital signs reviewed  Last Vitals:  Filed Vitals:   10/14/14 1016  BP: 120/59  Pulse: 75  Temp: 36.6 C  Resp: 18    Post vital signs: Reviewed  Level of consciousness: awake  Complications: No apparent anesthesia complications

## 2014-10-14 NOTE — Anesthesia Procedure Notes (Signed)
Spinal Patient location during procedure: OR Start time: 10/14/2014 9:34 AM Staffing Anesthesiologist: Rudean Curt Performed by: anesthesiologist  Preanesthetic Checklist Completed: patient identified, site marked, surgical consent, pre-op evaluation, timeout performed, IV checked, risks and benefits discussed and monitors and equipment checked Spinal Block Patient position: sitting Prep: DuraPrep Patient monitoring: heart rate, cardiac monitor, continuous pulse ox and blood pressure Approach: midline Location: L3-4 Injection technique: single-shot Needle Needle type: Sprotte  Needle gauge: 24 G Needle length: 9 cm Assessment Sensory level: T4 Additional Notes Patient identified.  Risk benefits discussed including failed block, incomplete pain control, headache, nerve damage, paralysis, blood pressure changes, nausea, vomiting, reactions to medication both toxic or allergic, and postpartum back pain.  Patient expressed understanding and wished to proceed.  All questions were answered.  Sterile technique used throughout procedure.  CSF was clear.  No parasthesia or other complications.  Please see nursing notes for vital signs.

## 2014-10-14 NOTE — H&P (Signed)
Brenda [name] is an 29 y.o. female. Pt has h/o incompetent cervix and desires a cervical cerclage after explaining the r/b/a and options.  Pt will also be getting weekly 17P.  Menstrual History:  Patient's last menstrual period was 05/13/2014 (approximate).    Past Medical History  Diagnosis Date  . Anemia   . Abnormal Pap smear   . Asthma     as a child, "outgrew it"    Past Surgical History  Procedure Laterality Date  . Cesarean section      2010    Family History  Problem Relation Age of Onset  . Stroke Mother   . Diabetes Mother   . Hypertension Mother   . Coronary artery disease Mother   . Hypertension Father     Social History:  reports that she has never smoked. She has never used smokeless tobacco. She reports that she drinks alcohol. She reports that she does not use illicit drugs.  Allergies:  Allergies  Allergen Reactions  . Oxycodone Hcl Hives  . Latex Itching    Prescriptions prior to admission  Medication Sig Dispense Refill Last Dose  . Prenatal Vit-Fe Fumarate-FA (PRENATAL MULTIVITAMIN) TABS tablet Take 1 tablet by mouth daily at 12 noon.   Past Week at Unknown time    ROS  Blood pressure 132/84, pulse 103, temperature 98.6 F (37 C), temperature source Oral, resp. rate 18, height 5\' 9"  (1.753 m), weight 127.007 kg (280 lb), last menstrual period 05/13/2014, SpO2 100 %. Physical Exam  Lungs CTA CV RRR Abd soft, NT, gravid FHT 152 Ext no calf tenderness  Results for orders placed or performed during the hospital encounter of 10/14/14 (from the past 24 hour(s))  CBC     Status: Abnormal   Collection Time: 10/14/14  8:10 AM  Result Value Ref Range   WBC 9.1 4.0 - 10.5 K/uL   RBC 5.46 (H) 3.87 - 5.11 MIL/uL   Hemoglobin 11.4 (L) 12.0 - 15.0 g/dL   HCT 34.7 (L) 36.0 - 46.0 %   MCV 63.6 (L) 78.0 - 100.0 fL   MCH 20.9 (L) 26.0 - 34.0 pg   MCHC 32.9 30.0 - 36.0 g/dL   RDW 20.0 (H) 11.5 - 15.5 %   Platelets 247 150 - 400 K/uL    No  results found.  Assessment/Plan: P1 with h/o incompetent cervix scheduled for cerclage.  R/B/A discussed, consent signed and witnessed and questions answered.  Raudel Bazen Y 10/14/2014, 9:02 AM

## 2014-10-14 NOTE — Anesthesia Preprocedure Evaluation (Signed)
Anesthesia Evaluation  Patient identified by MRN, date of birth, ID band Patient awake    Reviewed: Allergy & Precautions, H&P , NPO status , Patient's Chart, lab work & pertinent test results  Airway Mallampati: III       Dental   Pulmonary asthma ,  breath sounds clear to auscultation        Cardiovascular Exercise Tolerance: Good Rhythm:regular Rate:Normal     Neuro/Psych    GI/Hepatic   Endo/Other  Morbid obesity  Renal/GU      Musculoskeletal   Abdominal   Peds  Hematology  (+) anemia ,   Anesthesia Other Findings   Reproductive/Obstetrics (+) Pregnancy                             Anesthesia Physical Anesthesia Plan  ASA: III  Anesthesia Plan: Spinal   Post-op Pain Management:    Induction:   Airway Management Planned:   Additional Equipment:   Intra-op Plan:   Post-operative Plan:   Informed Consent: I have reviewed the patients History and Physical, chart, labs and discussed the procedure including the risks, benefits and alternatives for the proposed anesthesia with the patient or authorized representative who has indicated his/her understanding and acceptance.     Plan Discussed with: Anesthesiologist, CRNA and Surgeon  Anesthesia Plan Comments:         Anesthesia Quick Evaluation

## 2014-10-14 NOTE — Transfer of Care (Signed)
Immediate Anesthesia Transfer of Care Note  Patient: Brenda [name]  Procedure(s) Performed: Procedure(s): CERCLAGE CERVICAL (N/A)  Patient Location: PACU  Anesthesia Type:Spinal  Level of Consciousness: awake, alert  and oriented  Airway & Oxygen Therapy: Patient Spontanous Breathing  Post-op Assessment: Report given to PACU RN and Post -op Vital signs reviewed and stable  Post vital signs: Reviewed and stable  Complications: No apparent anesthesia complications

## 2014-10-14 NOTE — Op Note (Signed)
Preop Diagnosis: Incompetent Cervix   Postop Diagnosis: Incompetent cervix   Procedure: CERCLAGE CERVICAL   Anesthesia: Spinal   Anesthesiologist: Rudean Curt, MD   Attending: Delice Lesch, MD   Assistant: N/a  Findings: N/a  Pathology: N/a  Fluids: 1000 cc  UOP: 30cc  EBL: None  Complications: None  Procedure:Then patient was taken to the operating room after the risks, benefits and alternatives discussed with the patient and consent signed and witnessed.  The patient was given a spinal per anesthesia and placed in the dorsal lithotomy position.  The patient was prepped and draped in the usual sterile fashion.  A cervical cerclage stitch was placed using Mersilene and the knot was tied anteriorly on the cervix with a stitch of 1 prolene at the base to help elevate knot if necessary when it comes time for removal.  Clindamycin douche was performed.  Membranes remained intact and post procedure fetal heart rate was 155.  Sponge, lap and needle count was correct and the patient was transferred to the recovery room in good condition.

## 2014-10-15 ENCOUNTER — Encounter (HOSPITAL_COMMUNITY): Payer: Self-pay | Admitting: Obstetrics and Gynecology

## 2014-10-23 ENCOUNTER — Inpatient Hospital Stay (HOSPITAL_COMMUNITY): Admission: AD | Admit: 2014-10-23 | Payer: MEDICAID

## 2014-10-23 ENCOUNTER — Inpatient Hospital Stay (HOSPITAL_COMMUNITY): Admission: AD | Admit: 2014-10-23 | Payer: MEDICAID | Admitting: Obstetrics and Gynecology

## 2014-11-21 ENCOUNTER — Encounter (HOSPITAL_COMMUNITY): Payer: Self-pay | Admitting: Emergency Medicine

## 2014-11-21 ENCOUNTER — Emergency Department (INDEPENDENT_AMBULATORY_CARE_PROVIDER_SITE_OTHER)
Admission: EM | Admit: 2014-11-21 | Discharge: 2014-11-21 | Disposition: A | Discharge status: Home / Self Care | Payer: Medicaid Other | Source: Home / Self Care | Attending: Family Medicine | Admitting: Family Medicine

## 2014-11-21 DIAGNOSIS — B3731 Acute candidiasis of vulva and vagina: Secondary | ICD-10-CM

## 2014-11-21 DIAGNOSIS — B373 Candidiasis of vulva and vagina: Secondary | ICD-10-CM

## 2014-11-21 MED ORDER — NYSTATIN 100000 UNITS VA TABS: 1.0000 | ORAL_TABLET | Freq: Every day | VAGINAL | Status: DC

## 2014-11-21 NOTE — ED Provider Notes (Signed)
CSN: [id number]     Arrival date & time 11/21/14  1349 History   First MD Initiated Contact with Patient 11/21/14 1419     Chief Complaint  Patient presents with  . Vaginal Itching   (Consider location/radiation/quality/duration/timing/severity/associated sxs/prior Treatment) HPI        29 year old female who is [redacted] weeks pregnant presents complaining of vaginal itching and discharge. She was seen by her OB/GYN earlier this week and was diagnosed with vaginal candidiasis. She was treated with clotrimazole cream. Since then the itching has gotten worse. No pain, no abdominal pain or vaginal bleeding. No risk for STDs.  Past Medical History  Diagnosis Date  . Anemia   . Abnormal Pap smear   . Asthma     as a child, "outgrew it"   Past Surgical History  Procedure Laterality Date  . Cesarean section      2010  . Cervical cerclage N/A 10/14/2014    Procedure: CERCLAGE CERVICAL;  Surgeon: Delice Lesch, MD;  Location: [facility];  Service: Gynecology;  Laterality: N/A;   Family History  Problem Relation Age of Onset  . Stroke Mother   . Diabetes Mother   . Hypertension Mother   . Coronary artery disease Mother   . Hypertension Father    History  Substance Use Topics  . Smoking status: Never Smoker   . Smokeless tobacco: Never Used  . Alcohol Use: Yes     Comment: occaisionally, not currently with pregnancy   OB History    Gravida Para Term Preterm AB TAB SAB Ectopic Multiple Living   3 1  1 1  1   1      Review of Systems  Genitourinary: Positive for vaginal discharge (and itching). Negative for vaginal bleeding.  All other systems reviewed and are negative.   Allergies  Oxycodone hcl and Latex  Home Medications   Prior to Admission medications   Medication Sig Start Date End Date Taking? Authorizing Provider  HYDROcodone-acetaminophen (NORCO/VICODIN) 5-325 MG per tablet Take 1 tablet by mouth every 6 (six) hours as needed for moderate pain. 10/14/14   Delice Lesch, MD   ibuprofen (ADVIL,MOTRIN) 600 MG tablet Take 1 tablet (600 mg total) by mouth every 6 (six) hours as needed. 10/14/14   Delice Lesch, MD  nystatin 100000 UNITS vaginal tablet Place 1 tablet vaginally at bedtime. 11/21/14   Liam Graham, PA-C  Prenatal Vit-Fe Fumarate-FA (PRENATAL MULTIVITAMIN) TABS tablet Take 1 tablet by mouth daily at 12 noon.    Historical Provider, MD   BP 131/80 mmHg  Pulse 90  Temp(Src) 98.2 F (36.8 C) (Oral)  Resp 18  SpO2 99%  LMP 05/13/2014 (Approximate) Physical Exam  Constitutional: She is oriented to person, place, and time. Vital signs are normal. She appears well-developed and well-nourished. No distress.  HENT:  Head: Normocephalic and atraumatic.  Pulmonary/Chest: Effort normal. No respiratory distress.  Neurological: She is alert and oriented to person, place, and time. She has normal strength. Coordination normal.  Skin: Skin is warm and dry. No rash noted. She is not diaphoretic.  Psychiatric: She has a normal mood and affect. Judgment normal.  Nursing note and vitals reviewed.   ED Course  Procedures (including critical care time) Labs Review Labs Reviewed - No data to display  Imaging Review No results found.   MDM   1. Vagina, candidiasis    She has failed Chlortrimazole, stop Chlortrimazole and start vaginal nystatin tablets. Follow up with OB/GYN  Meds  ordered this encounter  Medications  . nystatin 100000 UNITS vaginal tablet    Sig: Place 1 tablet vaginally at bedtime.    Dispense:  10 tablet    Refill:  0       Liam Graham, PA-C 11/21/14 1516

## 2014-11-21 NOTE — ED Notes (Signed)
Patient c/o vaginal itching following using a cream for treatment of her yeast infection x 1 week ago. Patient reports she was waiting to see her OB again but itching is unbearable. Patient is [redacted] weeks pregnant. NAD.

## 2014-11-21 NOTE — Discharge Instructions (Signed)

## 2014-12-02 ENCOUNTER — Inpatient Hospital Stay (HOSPITAL_COMMUNITY)
Admission: AD | Admit: 2014-12-02 | Discharge: 2014-12-02 | Disposition: A | Discharge status: Home / Self Care | Payer: Medicaid Other | Source: Ambulatory Visit | Attending: Obstetrics and Gynecology | Admitting: Obstetrics and Gynecology

## 2014-12-02 ENCOUNTER — Other Ambulatory Visit (HOSPITAL_COMMUNITY): Payer: Self-pay | Admitting: Obstetrics and Gynecology

## 2014-12-02 ENCOUNTER — Other Ambulatory Visit: Payer: Self-pay | Admitting: Obstetrics and Gynecology

## 2014-12-02 DIAGNOSIS — Z3A27 27 weeks gestation of pregnancy: Secondary | ICD-10-CM | POA: Insufficient documentation

## 2014-12-02 DIAGNOSIS — O365933 Maternal care for other known or suspected poor fetal growth, third trimester, fetus 3: Secondary | ICD-10-CM

## 2014-12-02 DIAGNOSIS — Z3689 Encounter for other specified antenatal screening: Secondary | ICD-10-CM

## 2014-12-02 LAB — US OB FOLLOW UP

## 2014-12-02 MED ORDER — BETAMETHASONE SOD PHOS & ACET 6 (3-3) MG/ML IJ SUSP
12.0000 mg | INTRAMUSCULAR | Status: DC
  Administered 2014-12-02: 12 mg via INTRAMUSCULAR
  Filled 2014-12-02: qty 2

## 2014-12-02 NOTE — MAU Note (Signed)
Pt presents to MAU for betamethasone injection.

## 2014-12-03 ENCOUNTER — Inpatient Hospital Stay (HOSPITAL_COMMUNITY)
Admission: AD | Admit: 2014-12-03 | Discharge: 2014-12-03 | Disposition: A | Discharge status: Home / Self Care | Payer: Medicaid Other | Source: Ambulatory Visit | Attending: Obstetrics and Gynecology | Admitting: Obstetrics and Gynecology

## 2014-12-03 DIAGNOSIS — Z3A29 29 weeks gestation of pregnancy: Secondary | ICD-10-CM | POA: Diagnosis not present

## 2014-12-03 MED ORDER — BETAMETHASONE SOD PHOS & ACET 6 (3-3) MG/ML IJ SUSP
12.0000 mg | Freq: Once | INTRAMUSCULAR | Status: AC
  Administered 2014-12-03: 12 mg via INTRAMUSCULAR
  Filled 2014-12-03: qty 2

## 2014-12-08 ENCOUNTER — Ambulatory Visit (HOSPITAL_COMMUNITY)
Admission: RE | Admit: 2014-12-08 | Discharge: 2014-12-08 | Disposition: A | Discharge status: Home / Self Care | Payer: Medicaid Other | Source: Ambulatory Visit | Attending: Obstetrics and Gynecology | Admitting: Obstetrics and Gynecology

## 2014-12-08 ENCOUNTER — Encounter (HOSPITAL_COMMUNITY): Payer: Self-pay

## 2014-12-08 DIAGNOSIS — Z3689 Encounter for other specified antenatal screening: Secondary | ICD-10-CM

## 2014-12-08 DIAGNOSIS — O365990 Maternal care for other known or suspected poor fetal growth, unspecified trimester, not applicable or unspecified: Secondary | ICD-10-CM | POA: Insufficient documentation

## 2014-12-08 DIAGNOSIS — Z3A28 28 weeks gestation of pregnancy: Secondary | ICD-10-CM | POA: Insufficient documentation

## 2014-12-08 DIAGNOSIS — Z36 Encounter for antenatal screening of mother: Secondary | ICD-10-CM | POA: Insufficient documentation

## 2014-12-08 DIAGNOSIS — O365930 Maternal care for other known or suspected poor fetal growth, third trimester, not applicable or unspecified: Secondary | ICD-10-CM | POA: Insufficient documentation

## 2014-12-08 DIAGNOSIS — O3421 Maternal care for scar from previous cesarean delivery: Secondary | ICD-10-CM | POA: Diagnosis not present

## 2014-12-08 DIAGNOSIS — O3433 Maternal care for cervical incompetence, third trimester: Secondary | ICD-10-CM | POA: Diagnosis not present

## 2014-12-08 DIAGNOSIS — O365933 Maternal care for other known or suspected poor fetal growth, third trimester, fetus 3: Secondary | ICD-10-CM

## 2014-12-10 ENCOUNTER — Encounter (HOSPITAL_COMMUNITY): Payer: Self-pay | Admitting: Obstetrics and Gynecology

## 2014-12-31 ENCOUNTER — Other Ambulatory Visit: Payer: Self-pay | Admitting: Obstetrics and Gynecology

## 2015-01-27 ENCOUNTER — Encounter (HOSPITAL_COMMUNITY)
Admission: RE | Admit: 2015-01-27 | Discharge: 2015-01-27 | Disposition: A | Discharge status: Home / Self Care | Payer: Medicaid Other | Source: Ambulatory Visit | Attending: Obstetrics and Gynecology | Admitting: Obstetrics and Gynecology

## 2015-01-27 ENCOUNTER — Encounter (HOSPITAL_COMMUNITY): Payer: Self-pay

## 2015-01-27 DIAGNOSIS — Z01818 Encounter for other preprocedural examination: Secondary | ICD-10-CM | POA: Insufficient documentation

## 2015-01-27 LAB — CBC
HCT: 35.7 % — ABNORMAL LOW (ref 36.0–46.0)
Hemoglobin: 11.8 g/dL — ABNORMAL LOW (ref 12.0–15.0)
MCH: 22.3 pg — AB (ref 26.0–34.0)
MCHC: 33.1 g/dL (ref 30.0–36.0)
MCV: 67.4 fL — AB (ref 78.0–100.0)
PLATELETS: 217 10*3/uL (ref 150–400)
RBC: 5.3 MIL/uL — AB (ref 3.87–5.11)
RDW: 15.8 % — AB (ref 11.5–15.5)
WBC: 8.9 10*3/uL (ref 4.0–10.5)

## 2015-01-27 NOTE — Patient Instructions (Signed)
Your procedure is scheduled on: Jan 29, 2015  Enter through the Main Entrance of [facility] at:  [place] up the phone at the desk and dial [phone].  Call this number if you have problems the morning of surgery: [phone].  Remember: Do NOT eat food: after midnight on Thursday  Do NOT drink clear liquids after: after midnight on Thursday  Take these medicines the morning of surgery with a SIP OF WATER:  None   Do NOT wear jewelry (body piercing), metal hair clips/bobby pins, or nail polish. Do NOT wear lotions, powders, or perfumes.  You may wear deoderant. Do NOT shave for 48 hours prior to surgery. Do NOT bring valuables to the hospital. Leave suitcase in car.  After surgery it may be brought to your room.  For patients admitted to the hospital, checkout time is 11:00 AM the day of discharge.

## 2015-01-28 ENCOUNTER — Encounter (HOSPITAL_COMMUNITY): Payer: Self-pay | Admitting: Anesthesiology

## 2015-01-28 LAB — RPR: RPR: NONREACTIVE

## 2015-01-28 MED ORDER — DEXTROSE 5 % IV SOLN
3.0000 g | INTRAVENOUS | Status: AC
  Administered 2015-01-29: 2 g via INTRAVENOUS
  Filled 2015-01-28: qty 3000

## 2015-01-28 NOTE — Anesthesia Preprocedure Evaluation (Addendum)
Anesthesia Evaluation  Patient identified by MRN, date of birth, ID band Patient awake    Reviewed: Allergy & Precautions, NPO status , Patient's Chart, lab work & pertinent test results  Airway Mallampati: I  TM Distance: >3 FB Neck ROM: Full    Dental  (+) Poor Dentition, Missing, Chipped   Pulmonary asthma ,  breath sounds clear to auscultation  Pulmonary exam normal       Cardiovascular negative cardio ROS Normal cardiovascular examRhythm:Regular Rate:Normal     Neuro/Psych negative neurological ROS  negative psych ROS   GI/Hepatic Neg liver ROS, GERD-  ,  Endo/Other  Morbid obesity  Renal/GU negative Renal ROS  negative genitourinary   Musculoskeletal negative musculoskeletal ROS (+)   Abdominal (+) + obese,   Peds  Hematology  (+) anemia ,   Anesthesia Other Findings   Reproductive/Obstetrics (+) Pregnancy Hx/o previous C/Section                            Anesthesia Physical Anesthesia Plan  ASA: III  Anesthesia Plan: Spinal   Post-op Pain Management:    Induction:   Airway Management Planned: Natural Airway  Additional Equipment:   Intra-op Plan:   Post-operative Plan:   Informed Consent: I have reviewed the patients History and Physical, chart, labs and discussed the procedure including the risks, benefits and alternatives for the proposed anesthesia with the patient or authorized representative who has indicated his/her understanding and acceptance.     Plan Discussed with: Anesthesiologist, CRNA and Surgeon  Anesthesia Plan Comments:         Anesthesia Quick Evaluation

## 2015-01-29 ENCOUNTER — Inpatient Hospital Stay (HOSPITAL_COMMUNITY)
Admission: RE | Admit: 2015-01-29 | Discharge: 2015-02-01 | DRG: 765 | Disposition: A | Discharge status: Home / Self Care | Payer: Medicaid Other | Source: Ambulatory Visit | Attending: Obstetrics and Gynecology | Admitting: Obstetrics and Gynecology

## 2015-01-29 ENCOUNTER — Inpatient Hospital Stay (HOSPITAL_COMMUNITY): Payer: Medicaid Other | Admitting: Anesthesiology

## 2015-01-29 ENCOUNTER — Encounter (HOSPITAL_COMMUNITY)
Admission: RE | Disposition: A | Discharge status: Home / Self Care | Payer: Self-pay | Source: Ambulatory Visit | Attending: Obstetrics and Gynecology

## 2015-01-29 ENCOUNTER — Encounter (HOSPITAL_COMMUNITY): Payer: Self-pay | Admitting: *Deleted

## 2015-01-29 DIAGNOSIS — Z91040 Latex allergy status: Secondary | ICD-10-CM

## 2015-01-29 DIAGNOSIS — Z3A37 37 weeks gestation of pregnancy: Secondary | ICD-10-CM | POA: Diagnosis present

## 2015-01-29 DIAGNOSIS — Z888 Allergy status to other drugs, medicaments and biological substances status: Secondary | ICD-10-CM

## 2015-01-29 DIAGNOSIS — N858 Other specified noninflammatory disorders of uterus: Secondary | ICD-10-CM | POA: Diagnosis present

## 2015-01-29 DIAGNOSIS — O365930 Maternal care for other known or suspected poor fetal growth, third trimester, not applicable or unspecified: Secondary | ICD-10-CM | POA: Diagnosis present

## 2015-01-29 DIAGNOSIS — O3433 Maternal care for cervical incompetence, third trimester: Secondary | ICD-10-CM | POA: Diagnosis present

## 2015-01-29 DIAGNOSIS — Z885 Allergy status to narcotic agent status: Secondary | ICD-10-CM

## 2015-01-29 DIAGNOSIS — Z23 Encounter for immunization: Secondary | ICD-10-CM

## 2015-01-29 DIAGNOSIS — O99824 Streptococcus B carrier state complicating childbirth: Secondary | ICD-10-CM | POA: Diagnosis present

## 2015-01-29 DIAGNOSIS — O9902 Anemia complicating childbirth: Secondary | ICD-10-CM | POA: Diagnosis present

## 2015-01-29 DIAGNOSIS — O3421 Maternal care for scar from previous cesarean delivery: Principal | ICD-10-CM | POA: Diagnosis present

## 2015-01-29 DIAGNOSIS — Z98891 History of uterine scar from previous surgery: Secondary | ICD-10-CM

## 2015-01-29 SURGERY — Surgical Case
Anesthesia: Spinal | Site: Abdomen

## 2015-01-29 MED ORDER — PRENATAL MULTIVITAMIN CH
1.0000 | ORAL_TABLET | Freq: Every day | ORAL | Status: DC
  Administered 2015-01-29 – 2015-02-01 (×4): 1 via ORAL
  Filled 2015-01-29 (×4): qty 1

## 2015-01-29 MED ORDER — LACTATED RINGERS IV SOLN
INTRAVENOUS | Status: DC
  Administered 2015-01-29 (×2): via INTRAVENOUS

## 2015-01-29 MED ORDER — NALBUPHINE HCL 10 MG/ML IJ SOLN
5.0000 mg | INTRAMUSCULAR | Status: DC | PRN
Start: 2015-01-29 — End: 2015-02-01

## 2015-01-29 MED ORDER — PHENYLEPHRINE HCL 10 MG/ML IJ SOLN
INTRAMUSCULAR | Status: DC | PRN
  Administered 2015-01-29 (×2): 80 ug via INTRAVENOUS

## 2015-01-29 MED ORDER — ONDANSETRON HCL 4 MG/2ML IJ SOLN
INTRAMUSCULAR | Status: DC | PRN
  Administered 2015-01-29: 4 mg via INTRAVENOUS

## 2015-01-29 MED ORDER — NALOXONE HCL 0.4 MG/ML IJ SOLN: 0.4000 mg | INTRAMUSCULAR | Status: DC | PRN

## 2015-01-29 MED ORDER — LANOLIN HYDROUS EX OINT: 1.0000 "application " | TOPICAL_OINTMENT | CUTANEOUS | Status: DC | PRN

## 2015-01-29 MED ORDER — SIMETHICONE 80 MG PO CHEW
80.0000 mg | CHEWABLE_TABLET | Freq: Three times a day (TID) | ORAL | Status: DC
  Administered 2015-01-29 – 2015-02-01 (×10): 80 mg via ORAL
  Filled 2015-01-29 (×10): qty 1

## 2015-01-29 MED ORDER — KETOROLAC TROMETHAMINE 30 MG/ML IJ SOLN
INTRAMUSCULAR | Status: AC
  Filled 2015-01-29: qty 1

## 2015-01-29 MED ORDER — PHENYLEPHRINE 40 MCG/ML (10ML) SYRINGE FOR IV PUSH (FOR BLOOD PRESSURE SUPPORT)
PREFILLED_SYRINGE | INTRAVENOUS | Status: AC
  Filled 2015-01-29: qty 20

## 2015-01-29 MED ORDER — NALBUPHINE HCL 10 MG/ML IJ SOLN: 5.0000 mg | INTRAMUSCULAR | Status: DC | PRN

## 2015-01-29 MED ORDER — SENNOSIDES-DOCUSATE SODIUM 8.6-50 MG PO TABS
2.0000 | ORAL_TABLET | ORAL | Status: DC
  Administered 2015-01-30 – 2015-02-01 (×3): 2 via ORAL
  Filled 2015-01-29 (×3): qty 2

## 2015-01-29 MED ORDER — ONDANSETRON HCL 4 MG/2ML IJ SOLN
4.0000 mg | Freq: Three times a day (TID) | INTRAMUSCULAR | Status: DC | PRN
  Administered 2015-01-29: 4 mg via INTRAVENOUS
  Filled 2015-01-29: qty 2

## 2015-01-29 MED ORDER — IBUPROFEN 600 MG PO TABS
600.0000 mg | ORAL_TABLET | Freq: Four times a day (QID) | ORAL | Status: DC
  Administered 2015-01-29 – 2015-02-01 (×13): 600 mg via ORAL
  Filled 2015-01-29 (×13): qty 1

## 2015-01-29 MED ORDER — SIMETHICONE 80 MG PO CHEW: 80.0000 mg | CHEWABLE_TABLET | ORAL | Status: DC | PRN

## 2015-01-29 MED ORDER — NALBUPHINE HCL 10 MG/ML IJ SOLN
INTRAMUSCULAR | Status: AC
  Filled 2015-01-29: qty 1

## 2015-01-29 MED ORDER — SCOPOLAMINE 1 MG/3DAYS TD PT72
1.0000 | MEDICATED_PATCH | Freq: Once | TRANSDERMAL | Status: DC
  Administered 2015-01-29: 1.5 mg via TRANSDERMAL

## 2015-01-29 MED ORDER — FENTANYL CITRATE (PF) 100 MCG/2ML IJ SOLN
INTRAMUSCULAR | Status: DC | PRN
  Administered 2015-01-29: 25 ug via INTRATHECAL

## 2015-01-29 MED ORDER — DIPHENHYDRAMINE HCL 50 MG/ML IJ SOLN: 12.5000 mg | INTRAMUSCULAR | Status: DC | PRN

## 2015-01-29 MED ORDER — SIMETHICONE 80 MG PO CHEW
80.0000 mg | CHEWABLE_TABLET | ORAL | Status: DC
  Administered 2015-01-30 – 2015-02-01 (×3): 80 mg via ORAL
  Filled 2015-01-29 (×3): qty 1

## 2015-01-29 MED ORDER — NALOXONE HCL 1 MG/ML IJ SOLN
1.0000 ug/kg/h | INTRAVENOUS | Status: DC | PRN
  Filled 2015-01-29: qty 2

## 2015-01-29 MED ORDER — LACTATED RINGERS IV SOLN
INTRAVENOUS | Status: DC
  Administered 2015-01-29 (×3): via INTRAVENOUS

## 2015-01-29 MED ORDER — OXYTOCIN 40 UNITS IN LACTATED RINGERS INFUSION - SIMPLE MED: 62.5000 mL/h | INTRAVENOUS | Status: AC

## 2015-01-29 MED ORDER — DIPHENHYDRAMINE HCL 25 MG PO CAPS
25.0000 mg | ORAL_CAPSULE | ORAL | Status: DC | PRN
  Administered 2015-01-29: 25 mg via ORAL
  Filled 2015-01-29: qty 1

## 2015-01-29 MED ORDER — PHENYLEPHRINE 8 MG IN D5W 100 ML (0.08MG/ML) PREMIX OPTIME
INJECTION | INTRAVENOUS | Status: AC
  Filled 2015-01-29: qty 100

## 2015-01-29 MED ORDER — EPHEDRINE SULFATE 50 MG/ML IJ SOLN
INTRAMUSCULAR | Status: DC | PRN
  Administered 2015-01-29 (×2): 10 mg via INTRAVENOUS

## 2015-01-29 MED ORDER — WITCH HAZEL-GLYCERIN EX PADS: 1.0000 "application " | MEDICATED_PAD | CUTANEOUS | Status: DC | PRN

## 2015-01-29 MED ORDER — SODIUM CHLORIDE 0.9 % IJ SOLN: 3.0000 mL | INTRAMUSCULAR | Status: DC | PRN

## 2015-01-29 MED ORDER — DIPHENHYDRAMINE HCL 25 MG PO CAPS
25.0000 mg | ORAL_CAPSULE | Freq: Four times a day (QID) | ORAL | Status: DC | PRN
  Administered 2015-01-30: 25 mg via ORAL
  Filled 2015-01-29: qty 1

## 2015-01-29 MED ORDER — MENTHOL 3 MG MT LOZG: 1.0000 | LOZENGE | OROMUCOSAL | Status: DC | PRN

## 2015-01-29 MED ORDER — MEPERIDINE HCL 25 MG/ML IJ SOLN: 6.2500 mg | INTRAMUSCULAR | Status: DC | PRN

## 2015-01-29 MED ORDER — NALBUPHINE HCL 10 MG/ML IJ SOLN
5.0000 mg | Freq: Once | INTRAMUSCULAR | Status: AC | PRN
  Administered 2015-01-29: 5 mg via SUBCUTANEOUS

## 2015-01-29 MED ORDER — SCOPOLAMINE 1 MG/3DAYS TD PT72
MEDICATED_PATCH | TRANSDERMAL | Status: AC
  Administered 2015-01-29: 1.5 mg via TRANSDERMAL
  Filled 2015-01-29: qty 1

## 2015-01-29 MED ORDER — ACETAMINOPHEN 325 MG PO TABS
650.0000 mg | ORAL_TABLET | ORAL | Status: DC | PRN
  Administered 2015-01-30 – 2015-02-01 (×3): 650 mg via ORAL
  Filled 2015-01-29 (×3): qty 2

## 2015-01-29 MED ORDER — MORPHINE SULFATE (PF) 0.5 MG/ML IJ SOLN
INTRAMUSCULAR | Status: DC | PRN
  Administered 2015-01-29: .15 mg via INTRATHECAL

## 2015-01-29 MED ORDER — FENTANYL CITRATE (PF) 100 MCG/2ML IJ SOLN: 25.0000 ug | INTRAMUSCULAR | Status: DC | PRN

## 2015-01-29 MED ORDER — FENTANYL CITRATE (PF) 100 MCG/2ML IJ SOLN
INTRAMUSCULAR | Status: AC
  Filled 2015-01-29: qty 2

## 2015-01-29 MED ORDER — KETOROLAC TROMETHAMINE 30 MG/ML IJ SOLN
30.0000 mg | Freq: Four times a day (QID) | INTRAMUSCULAR | Status: AC | PRN
  Administered 2015-01-29: 30 mg via INTRAMUSCULAR

## 2015-01-29 MED ORDER — OXYTOCIN 10 UNIT/ML IJ SOLN
INTRAMUSCULAR | Status: AC
  Filled 2015-01-29: qty 4

## 2015-01-29 MED ORDER — BUPIVACAINE IN DEXTROSE 0.75-8.25 % IT SOLN
INTRATHECAL | Status: DC | PRN
Start: 2015-01-29 — End: 2015-01-29
  Administered 2015-01-29: 1.9 mL via INTRATHECAL

## 2015-01-29 MED ORDER — ZOLPIDEM TARTRATE 5 MG PO TABS: 5.0000 mg | ORAL_TABLET | Freq: Every evening | ORAL | Status: DC | PRN

## 2015-01-29 MED ORDER — TETANUS-DIPHTH-ACELL PERTUSSIS 5-2.5-18.5 LF-MCG/0.5 IM SUSP
0.5000 mL | Freq: Once | INTRAMUSCULAR | Status: AC
  Administered 2015-02-01: 0.5 mL via INTRAMUSCULAR
  Filled 2015-01-29: qty 0.5

## 2015-01-29 MED ORDER — ONDANSETRON HCL 4 MG/2ML IJ SOLN
INTRAMUSCULAR | Status: AC
  Filled 2015-01-29: qty 2

## 2015-01-29 MED ORDER — PHENYLEPHRINE 8 MG IN D5W 100 ML (0.08MG/ML) PREMIX OPTIME
INJECTION | INTRAVENOUS | Status: DC | PRN
  Administered 2015-01-29: 60 ug/min via INTRAVENOUS

## 2015-01-29 MED ORDER — MORPHINE SULFATE 0.5 MG/ML IJ SOLN
INTRAMUSCULAR | Status: AC
  Filled 2015-01-29: qty 10

## 2015-01-29 MED ORDER — NALBUPHINE HCL 10 MG/ML IJ SOLN: 5.0000 mg | Freq: Once | INTRAMUSCULAR | Status: AC | PRN

## 2015-01-29 MED ORDER — 0.9 % SODIUM CHLORIDE (POUR BTL) OPTIME
TOPICAL | Status: DC | PRN
  Administered 2015-01-29 (×2): 1000 mL

## 2015-01-29 MED ORDER — DIBUCAINE 1 % RE OINT: 1.0000 "application " | TOPICAL_OINTMENT | RECTAL | Status: DC | PRN

## 2015-01-29 MED ORDER — OXYTOCIN 10 UNIT/ML IJ SOLN
40.0000 [IU] | INTRAVENOUS | Status: DC | PRN
  Administered 2015-01-29: 40 [IU] via INTRAVENOUS

## 2015-01-29 MED ORDER — LACTATED RINGERS IV SOLN
INTRAVENOUS | Status: DC | PRN
  Administered 2015-01-29: 10:00:00 via INTRAVENOUS

## 2015-01-29 MED ORDER — HYDROCODONE-ACETAMINOPHEN 5-325 MG PO TABS: 1.0000 | ORAL_TABLET | Freq: Four times a day (QID) | ORAL | Status: DC | PRN

## 2015-01-29 MED ORDER — KETOROLAC TROMETHAMINE 30 MG/ML IJ SOLN: 30.0000 mg | Freq: Four times a day (QID) | INTRAMUSCULAR | Status: AC | PRN

## 2015-01-29 SURGICAL SUPPLY — 30 items
BENZOIN TINCTURE PRP APPL 2/3 (GAUZE/BANDAGES/DRESSINGS) ×3 IMPLANT
CLAMP CORD UMBIL (MISCELLANEOUS) ×3 IMPLANT
CLOSURE WOUND 1/2 X4 (GAUZE/BANDAGES/DRESSINGS) ×1
CLOTH BEACON ORANGE TIMEOUT ST (SAFETY) ×3 IMPLANT
DRAPE SHEET LG 3/4 BI-LAMINATE (DRAPES) ×3 IMPLANT
DRSG OPSITE POSTOP 4X10 (GAUZE/BANDAGES/DRESSINGS) ×3 IMPLANT
DURAPREP 26ML APPLICATOR (WOUND CARE) ×3 IMPLANT
ELECT REM PT RETURN 9FT ADLT (ELECTROSURGICAL) ×3
ELECTRODE REM PT RTRN 9FT ADLT (ELECTROSURGICAL) ×1 IMPLANT
GLOVE BIO SURGEON STRL SZ7.5 (GLOVE) ×3 IMPLANT
GLOVE BIOGEL PI IND STRL 7.5 (GLOVE) ×1 IMPLANT
GLOVE BIOGEL PI INDICATOR 7.5 (GLOVE) ×2
GOWN STRL REUS W/TWL LRG LVL3 (GOWN DISPOSABLE) ×6 IMPLANT
HEMOSTAT SURGICEL 4X8 (HEMOSTASIS) ×3 IMPLANT
NEEDLE HYPO 25X5/8 SAFETYGLIDE (NEEDLE) IMPLANT
NS IRRIG 1000ML POUR BTL (IV SOLUTION) ×3 IMPLANT
PACK C SECTION WH (CUSTOM PROCEDURE TRAY) ×3 IMPLANT
PAD OB MATERNITY 4.3X12.25 (PERSONAL CARE ITEMS) ×3 IMPLANT
RTRCTR C-SECT PINK 25CM LRG (MISCELLANEOUS) ×3 IMPLANT
SPONGE DRAIN TRACH 4X4 STRL 2S (GAUZE/BANDAGES/DRESSINGS) ×3 IMPLANT
STRIP CLOSURE SKIN 1/2X4 (GAUZE/BANDAGES/DRESSINGS) ×2 IMPLANT
SUT CHROMIC 2 0 CT 1 (SUTURE) ×3 IMPLANT
SUT MNCRL AB 3-0 PS2 27 (SUTURE) ×3 IMPLANT
SUT PLAIN 2 0 XLH (SUTURE) ×3 IMPLANT
SUT SILK 2 0 FS (SUTURE) ×3 IMPLANT
SUT VIC AB 0 CT1 36 (SUTURE) ×3 IMPLANT
SUT VIC AB 0 CTX 36 (SUTURE) ×6
SUT VIC AB 0 CTX36XBRD ANBCTRL (SUTURE) ×3 IMPLANT
TOWEL OR 17X24 6PK STRL BLUE (TOWEL DISPOSABLE) ×3 IMPLANT
TRAY FOLEY CATH SILVER 14FR (SET/KITS/TRAYS/PACK) ×3 IMPLANT

## 2015-01-29 NOTE — Transfer of Care (Signed)
Immediate Anesthesia Transfer of Care Note  Patient: Brenda [name]  Procedure(s) Performed: Procedure(s): CESAREAN SECTION (N/A)  Patient Location: PACU  Anesthesia Type:Spinal  Level of Consciousness: awake, alert  and oriented  Airway & Oxygen Therapy: Patient Spontanous Breathing  Post-op Assessment: Report given to RN and Post -op Vital signs reviewed and stable  Post vital signs: Reviewed and stable  Last Vitals:  Filed Vitals:   01/29/15 0755  BP: 119/84  Pulse: 103  Temp: 30.8 C    Complications: No apparent anesthesia complications

## 2015-01-29 NOTE — Op Note (Addendum)
Cesarean Section Procedure Note  Indications: 37 2/7 wks with h/o classical c-section and IUGR this pregnancy scheduled for repeat c-section  Pre-operative Diagnosis: 1.History of mid vertical incision on Prior 26 week Cesarean Section 2.IUGR   Post-operative Diagnosis: History of mid vertical incision on Prior 26 week Cesarean Section 2.IUGR  Procedure: REPEAT CESAREAN SECTION and REMOVAL OF CERCLAGE  Surgeon: Everett Graff, MD    Assistants: Sallee Provencal, CNM  Anesthesia: Spinal  Anesthesiologist: Josephine Igo, MD   Procedure Details  The patient was taken to the operating room secondary to h/o classical c-section and IUGR after the risks, benefits, complications, treatment options, and expected outcomes were discussed with the patient.  The patient concurred with the proposed plan, giving informed consent which was signed and witnessed. The patient was taken to Operating Room 1, identified as Marvel Plan and the procedure verified as C-Section Delivery. A Time Out was held and the above information confirmed.  After induction of anesthesia by obtaining a spinal, the patient was prepped and draped in the usual sterile manner. A Pfannenstiel skin incision was made and carried down through the subcutaneous tissue to the underlying layer of fascia.  The fascia was incised bilaterally and extended transversely bilaterally with the Mayo scissors. Kocher clamps were placed on the inferior aspect of the fascial incision and the underlying rectus muscle was separated from the fascia. The same was done on the superior aspect of the fascial incision.  The peritoneum was identified, entered bluntly and extended manually.  An Alexis self-retaining retractor was placed.  The utero-vesical peritoneal reflection was incised transversely and the bladder flap was bluntly freed from the lower uterine segment. A low transverse uterine incision was made with the scalpel and extended bilaterally with the  bandage scissors.  The infant was delivered in vertex position without difficulty.  After the umbilical cord was clamped and cut, the infant was handed to the awaiting pediatricians.  Cord blood was obtained for evaluation.  The placenta was removed intact and appeared to be within normal limits. The uterus was cleared of all clots and debris. The uterine incision was closed with running interlocking sutures of 0 Vicryl and a second imbricating layer was performed as well.   Bilateral tubes and ovaries appeared to be within normal limits.  Good hemostasis was noted.  Copious irrigation was performed until clear.  The peritoneum was repaired with 2-0 chromic via a running suture.  The fascia was reapproximated with a running suture of 0 Vicryl. The subcutaneous tissue was reapproximated with 3 interrupted sutures of 2-0 plain after placing J-P drain (size 7).  The J-P drain was sutured in place using 2-0 silk.  The skin was reapproximated with a subcuticular suture of 3-0 monocryl.  Steristrips were applied.  Dressing was applied and pt was then frog legged, speculum placed and cerclage removed without difficulty.  Instrument, sponge, and needle counts were correct prior to abdominal closure and at the conclusion of the case.  The patient was awaiting transfer to the recovery room in good condition.  Findings: Live female infant with Apgars 5 at one minute and 8 at five minutes.  Normal appearing bilateral ovaries and fallopian tubes were noted.  Estimated Blood Loss:  1200 ml         Drains: foley to gravity 300 cc clear urine         Total IV Fluids: 2500 ml         Specimens to Pathology: Placenta  Complications:  None; patient tolerated the procedure well.         Disposition: PACU - hemodynamically stable.         Condition: stable  Attending Attestation: I performed the procedure.

## 2015-01-29 NOTE — Anesthesia Postprocedure Evaluation (Signed)
  Anesthesia Post-op Note  Patient: Brenda [name]  Procedure(s) Performed: Procedure(s): CESAREAN SECTION (N/A)  Patient Location: PACU  Anesthesia Type:Spinal  Level of Consciousness: awake, alert  and oriented  Airway and Oxygen Therapy: Patient Spontanous Breathing  Post-op Pain: none  Post-op Assessment: Post-op Vital signs reviewed, Patient's Cardiovascular Status Stable, Respiratory Function Stable, Patent Airway, No signs of Nausea or vomiting, Pain level controlled, No headache, No backache and No residual numbness  Post-op Vital Signs: Reviewed and stable  Last Vitals:  Filed Vitals:   01/29/15 1215  BP: 132/78  Pulse: 85  Temp:   Resp: 18    Complications: No apparent anesthesia complications

## 2015-01-29 NOTE — Anesthesia Procedure Notes (Signed)
Spinal Patient location during procedure: OR Start time: 01/29/2015 9:22 AM Staffing Anesthesiologist: Josephine Igo Performed by: anesthesiologist  Preanesthetic Checklist Completed: patient identified, site marked, surgical consent, pre-op evaluation, timeout performed, IV checked, risks and benefits discussed and monitors and equipment checked Spinal Block Patient position: sitting Prep: site prepped and draped and DuraPrep Patient monitoring: heart rate, cardiac monitor, continuous pulse ox and blood pressure Approach: midline Location: L4-5 Injection technique: single-shot Needle Needle type: Sprotte  Needle gauge: 24 G Needle length: 9 cm Needle insertion depth: 7 cm Assessment Sensory level: T4 Additional Notes Patient tolerated procedure well. Adequate sensory level.

## 2015-01-29 NOTE — Anesthesia Postprocedure Evaluation (Signed)
Anesthesia Post Note  Patient: Brenda [name]  Procedure(s) Performed: Procedure(s) (LRB): CESAREAN SECTION (N/A)  Anesthesia type: Spinal  Patient location: Mother/Baby  Post pain: Pain level controlled  Post assessment: Post-op Vital signs reviewed  Last Vitals:  Filed Vitals:   01/29/15 2057  BP: 140/62  Pulse: 74  Temp: 36.8 C  Resp: 18    Post vital signs: Reviewed  Level of consciousness: awake  Complications: No apparent anesthesia complications

## 2015-01-29 NOTE — Addendum Note (Signed)
Addendum  created 01/29/15 2147 by Flossie Dibble, CRNA   Modules edited: Notes Section   Notes Section:  File: [id number]

## 2015-01-29 NOTE — H&P (Signed)
Brenda [name] is a 29 y.o. female, G2 P0101 at 37.2 weeks  Patient Active Problem List   Diagnosis Date Noted  . IUGR (intrauterine growth restriction) affecting care of mother   . [redacted] weeks gestation of pregnancy   . Irregular menstrual cycle   . Pelvic pressure in pregnancy, antepartum   . Encounter for fetal anatomic survey   . [redacted] weeks gestation of pregnancy   . Cellulitis of leg, right 04/26/2014  . SIRS (systemic inflammatory response syndrome) 04/24/2014  . DUB (dysfunctional uterine bleeding) 09/01/2012  . Anemia 09/01/2012    Pregnancy Course: Patient entered care at 21.1 weeks.   EDC of 02/17/15 was established by LMP.      [place] evaluations:    weeks - Dating:    weeks - Anatomy:      weeks - FU:    weeks -   Significant prenatal events:      Last evaluation:   37.1 weeks     Reason for admission:  Repeat CS , Hx of vertical incision at 26 weeks   Pt States:   Contractions Frequency: none         Contraction severity: n/a         Fetal activity: +FM  OB History    Gravida Para Term Preterm AB TAB SAB Ectopic Multiple Living   3 1  1 1  1   1      Past Medical History  Diagnosis Date  . Anemia   . Abnormal Pap smear   . Asthma     as a child, "outgrew it"   Past Surgical History  Procedure Laterality Date  . Cesarean section      2010  . Cervical cerclage N/A 10/14/2014    Procedure: CERCLAGE CERVICAL;  Surgeon: Delice Lesch, MD;  [facility];  Service: Gynecology;  Laterality: N/A;  . Facial reconstruction surgery     Family History: family history includes Coronary artery disease in her mother; Diabetes in her mother; Hypertension in her father and mother; Stroke in her mother. Social History:  reports that she has never smoked. She has never used smokeless tobacco. She reports that she drinks alcohol. She reports that she does not use illicit drugs.   Prenatal Transfer Tool  Maternal Diabetes: negative Genetic Screening: Normal  Maternal  Ultrasounds/Referrals: n/a Fetal Ultrasounds or other Referrals:  SGA earlier in pregnancy but wnl on last u/s Maternal Substance Abuse:  none Significant Maternal Medications:  none Significant Maternal Lab Results: none  ROS:  See HPI above, all other systems are negative  Allergies  Allergen Reactions  . Oxycodone Hcl Hives  . Terconazole Itching  . Latex Itching      Last menstrual period 05/13/2014.  Maternal Exam:  Uterine Assessment: Contraction frequency is rare.  Abdomen: Gravid, non tender. Fundal height is aga.  Normal external genitalia, vulva, cervix, uterus and adnexa.  No lesions noted on exam.  Pelvis adequate for delivery.  Fetal presentation: Vertex by Leopold's   Fetal Exam:  Monitor Surveillance : Continuous Monitoring / Intermitting per -  Mode: Ultrasound.  NICHD: Category CTXs: Q minutes EFW    lbs  Physical Exam: Nursing note and vitals reviewed General: alert and cooperative She appears well nourished Psychiatric: Normal mood and affect. Her behavior is normal Head: Normocephalic Eyes: Pupils are equal, round, and reactive to light Neck: Normal range of motion Cardiovascular: RRR without murmur  Respiratory: CTAB. Effort normal  Abd: soft, non-tender, +BS, no  rebound, no guarding  Genitourinary: Vagina normal  Neurological: A&Ox3 Skin: Warm and dry  Musculoskeletal: Normal range of motion  Homan's sign negative bilaterally No evidence of DVTs.  Edema: + edema/Minimal bilaterally non-pitting edema DTR: 2+ Clonus: None   Prenatal labs: ABO, Rh: --/--/O POS (05/11 0855) Antibody: NEG (05/11 0855) Rubella:    RPR: Non Reactive (05/11 0855)  HBsAg: Negative (01/15 0000)  HIV: Non-reactive (01/15 0000)  GBS:  positive Sickle cell/Hgb electrophoresis:  WNL Pap: neg GC:    Chlamydia:  Genetic screenings:  Normal quad screen Glucola:  neg  Assessment:  IUP at 37.2 weeks NICHD: Category Membranes: intact GBS positive Diagnosis:    Plan:  Admit to BS for schedule CS d/t  R&B of CS reviewed with patient and family.  Pt and family verbalize understanding of the procedure and agree with treatment plan.  Possibility of needing a blood transfusion reviewed.   Pt will accept blood products.   Routine Pre-OP orders Ancef per protocol May have a clear/thin diet 6 hours after CS, advance as tolerate 12 hours after CS      Venus Standard, CNM, MSN 01/29/2015, 6:33 AM       All information will be confirmed upon admisson

## 2015-01-30 LAB — CBC
HCT: 28.8 % — ABNORMAL LOW (ref 36.0–46.0)
HEMOGLOBIN: 9.5 g/dL — AB (ref 12.0–15.0)
MCH: 22.3 pg — ABNORMAL LOW (ref 26.0–34.0)
MCHC: 33 g/dL (ref 30.0–36.0)
MCV: 67.6 fL — ABNORMAL LOW (ref 78.0–100.0)
PLATELETS: 209 10*3/uL (ref 150–400)
RBC: 4.26 MIL/uL (ref 3.87–5.11)
RDW: 15.9 % — AB (ref 11.5–15.5)
WBC: 12.9 10*3/uL — ABNORMAL HIGH (ref 4.0–10.5)

## 2015-01-30 LAB — CCBB MATERNAL DONOR DRAW

## 2015-01-30 NOTE — Progress Notes (Addendum)
Subjective: Postpartum Day 1: Cesarean Delivery Patient reports tolerating PO and no problems voiding.  She has no complaints.  Ambulating without difficulty.  Does not seem distressed at all about the baby in NICU.  Her prior baby ws in NICU as well.    Objective: Vital signs in last 24 hours: Temp:  [97.5 F (36.4 C)-98.9 F (37.2 C)] 98.7 F (37.1 C) (05/14 1238) Pulse Rate:  [65-103] 100 (05/14 1238) Resp:  [16-20] 16 (05/14 1238) BP: (117-140)/(57-81) 122/81 mmHg (05/14 1238) SpO2:  [96 %-100 %] 100 % (05/14 1238)  Physical Exam:  General: alert and no distress Lochia: minimal Uterine Fundus: firm, NT Incision: dressing c/d/i JP draining 10cc/last 8hrs DVT Evaluation: No evidence of DVT seen on physical exam.   Recent Labs  01/30/15 0525  HGB 9.5*  HCT 28.8*    Assessment/Plan: Status post Cesarean section. Doing well postoperatively.  Continue current care.  Hesham Womac Y 01/30/2015, 1:13 PM

## 2015-01-31 LAB — TYPE AND SCREEN
ABO/RH(D): O POS
ANTIBODY SCREEN: NEGATIVE
UNIT DIVISION: 0
Unit division: 0

## 2015-01-31 NOTE — Clinical Social Work Maternal (Signed)
  CLINICAL SOCIAL WORK MATERNAL/CHILD NOTE  Patient Details  Name: Brenda [name] MRN: [id number] Date of Birth: [birth year]  Date:  01/31/2015  Clinical Social Worker Initiating Note:  Norlene Duel, LCSW Date/ Time Initiated:  01/31/15/1430     Child's Name:  Brenda [name]   Legal Guardian:   (Parents Wayne [name] and Andree Coss)   Need for Interpreter:  None   Date of Referral:  01/31/15     Reason for Referral:   (NICU admission)   Referral Source:  NICU   Address:  [address], [place] [address]  Phone number:   [phone])   Household Members:  Parents, Minor Children   Natural Supports (not living in the home):  Extended Family   Professional Supports: None   Employment:  (Both parents employed)   Type of Work:     Education:      Pensions consultant:  [employer]   Other Resources:  Physicist, medical , [place] Considerations Which May Impact Care:  none noted  Strengths:  Ability to meet basic needs , Compliance with medical plan , Home prepared for child , Understanding of illness   Risk Factors/Current Problems:  None   Cognitive State:  Alert , Insightful    Mood/Affect:  Happy , Bright , Relaxed    CSW Assessment:  Met with both parents.  They were pleasant and receptive to social work intervention.  Parents reside to together and have one other dependent age 46.  Informed that this child is a NICU graduate who spent almost 3 months in [facility] NICU, and is now doing very well.   Mother states that she was concerned with newborn needing NICU care, but felt very comfortable when she was greeted by many of the staff who caring for her other child.    Both parents are employed and mother reports plan to return to work.  Both parents seems to be coping well with newborn NICU admission.   No acute social concerns related at this time.  CSW will follow PRN.  CSW Plan/Description:     No barriers to discharge Will continue to  provide support PRN  Kaden Dunkel J, LCSW 01/31/2015, 3:59 PM

## 2015-01-31 NOTE — Progress Notes (Signed)
Brenda [name] [id number]  Subjective: Postpartum Day 2:  Repeat C/S, scheduled, due to hx of classical C/S Patient up ad lib, reports no syncope or dizziness. Feeding:  Breast and bottle Contraceptive plan:  Micronor  Baby in NICU for pulmonary hypertension and RDS.  Objective: Temp:  [97.9 F (36.6 C)-98.7 F (37.1 C)] 97.9 F (36.6 C) (05/15 0503) Pulse Rate:  [87-108] 87 (05/15 0503) Resp:  [16-21] 20 (05/15 0503) BP: (122-150)/(77-94) 150/82 mmHg (05/15 0503) SpO2:  [100 %] 100 % (05/15 0503)   CBC Latest Ref Rng 01/30/2015 01/27/2015 10/14/2014  WBC 4.0 - 10.5 K/uL 12.9(H) 8.9 9.1  Hemoglobin 12.0 - 15.0 g/dL 9.5(L) 11.8(L) 11.4(L)  Hematocrit 36.0 - 46.0 % 28.8(L) 35.7(L) 34.7(L)  Platelets 150 - 400 K/uL 209 217 247    Physical Exam:  General: alert Lochia: appropriate Uterine Fundus: firm Abdomen:  + bowel sounds Incision: Honeycomb dressing CDI--still has hypafix tape extending beyond borders of honeycomb DVT Evaluation: No evidence of DVT seen on physical exam. JP drain:   40 cc last 24 hours.  Assessment/Plan: Status post cesarean delivery, day 2. Anemia without hemodynamic instability Stable Continue current care. Remove extra tape from incision, change honeycomb dressing if needed. Plan for discharge tomorrow  Support to patient for NICU infant.    Donnel Saxon MSN, CNM 01/31/2015, 10:12 AM

## 2015-01-31 NOTE — Lactation Note (Signed)
This note was copied from the chart of Brenda Andree Coss. Lactation Consultation Note  Patient Name: Brenda [name] [id number] Date: 01/31/2015 Reason for consult: Follow-up assessment   With this mo of a NICU baby, now 50 hours post partum. Mom is transitioning into mature milk now, expressing up to 6 mls at a pumping. i advised her to try standard setting, and see if this increases her amount expressed. If not, she can return to [facility] for now.  [facility] should call mom tomorrow about a pump. If pumps are short at [facility], mom agrees to doing a [facility] laoner DEP at discharge tomorrow.    Maternal Data    Feeding    LATCH Score/Interventions                      Lactation Tools Discussed/Used     Consult Status Consult Status: Follow-up Date: 02/01/15 Follow-up type: In-patient    [name] 01/31/2015, 1:27 PM

## 2015-01-31 NOTE — Lactation Note (Signed)
This note was copied from the chart of Brenda Andree Coss. Lactation Consultation Note   Initial consult with this mom of a preterm baby, in NICU, estimated at [redacted] weeks gestation, dates 10 3/[redacted] weeks gestation, weighing 5 lbs 11.7 ounces. Mom was not feeling well, but was willing to begin pumping and hand expression. Small drops of colostrum collected. Mom familiar with pumping, and briefreview done. Wic fax sent for appointment for DEP. Mom knows to call for questions/concerns. Patient Name: Brenda [name] [id number] Date: 01/31/2015     Maternal Data  5 lbs   Feeding    LATCH Score/Interventions                      Lactation Tools Discussed/Used     Consult Status      [name] 01/31/2015, 7:40 AM

## 2015-02-01 ENCOUNTER — Encounter (HOSPITAL_COMMUNITY): Payer: Self-pay | Admitting: Obstetrics and Gynecology

## 2015-02-01 MED ORDER — IBUPROFEN 600 MG PO TABS: 600.0000 mg | ORAL_TABLET | Freq: Four times a day (QID) | ORAL | Status: DC | PRN

## 2015-02-01 MED ORDER — NORETHINDRONE 0.35 MG PO TABS: 1.0000 | ORAL_TABLET | Freq: Every day | ORAL | Status: DC

## 2015-02-01 MED ORDER — HYDROCODONE-ACETAMINOPHEN 5-325 MG PO TABS: 1.0000 | ORAL_TABLET | ORAL | Status: DC | PRN

## 2015-02-01 NOTE — Plan of Care (Signed)
Problem: Discharge Progression Outcomes Goal: Activity appropriate for discharge plan Outcome: Completed/Met Date Met:  02/01/15 Patient walks to NICU frequently and tolerates well.

## 2015-02-01 NOTE — Progress Notes (Signed)
Pt verbalizes understanding of d/c instructions, medications, follow up appts, when to seek medical attention and belongings policy. No questions at this time. Pt was given Breastfeeding Hotline phone number for [facility] as well as the contact information for [facility]. No IV at this time. Pt left [facility] with her husband and daughter to go to NICU to give baby milk prior to leaving. Marry Guan

## 2015-02-01 NOTE — Discharge Instructions (Signed)

## 2015-02-01 NOTE — Discharge Summary (Signed)
Cesarean Section Delivery Discharge Summary  Brenda [name]  DOB:    [birth year] MRN:    [id number] CSN:    [id number]  Date of admission:                  01/29/15  Date of discharge:                   02/01/15  Procedures this admission:  Repeat C/S, low transverse incision, due to hx of classical C/S  Date of Delivery: 01/29/15  Newborn Data:  Live born female  Birth Weight: 5 lb 11.7 oz (2600 g) APGAR: 5, 8  Baby remains in NICU at time of mother's d/c--pulmonary hypertension and RDS  History of Present Illness:  Ms. Brenda [name] is a 29 y.o. female, G3P0111, who presents at [redacted]w[redacted]d weeks gestation. The patient has been followed at [facility] Gy[facility] division of [facility]   Her pregnancy has been complicated by:  Patient Active Problem List   Diagnosis Date Noted  . S/P repeat low transverse C-section 01/29/2015  . IUGR (intrauterine growth restriction) affecting care of mother   . SIRS (systemic inflammatory response syndrome) 04/24/2014  . Anemia 09/01/2012     Hospital Course--Scheduled Cesarean: Patient was admitted on 01/29/15 for a scheduled repeat cesarean delivery, with hx of classical C/S and IUGR.   She was taken to the operating room, where Dr. Mancel Bale performed a repeat LTCS under spinal anesthesia, with delivery of a viable female, with weight and Apgars as listed below. Infant was taken to NICU due to respiratory issues, with dx of pulmonary hypertension and RDS. The patient was taken to recovery in good condition.  Patient planned to breast feed.  On post-op day 1, patient was doing well, tolerating a regular diet, with Hgb of 9.5.  Throughout her stay, her physical exam was WNL, her incision was CDI, and her vital signs remained stable.  By post-op day 3, she was up ad lib, tolerating a regular diet, with good pain control with po med.  She was deemed to have received the full benefit of her hospital stay, and was  discharged home in stable condition.  Contraceptive choice was Micronor.  JP drain was removed on day of d/c.     Feeding:  breast  Contraception:  oral progesterone-only contraceptive  Hemoglobin Results:  CBC Latest Ref Rng 01/30/2015 01/27/2015 10/14/2014  WBC 4.0 - 10.5 K/uL 12.9(H) 8.9 9.1  Hemoglobin 12.0 - 15.0 g/dL 9.5(L) 11.8(L) 11.4(L)  Hematocrit 36.0 - 46.0 % 28.8(L) 35.7(L) 34.7(L)  Platelets 150 - 400 K/uL 209 217 247     Discharge Physical Exam:   General: alert Lochia: appropriate Uterine Fundus: firm Abdomen:  + bowel sounds Incision: healing well DVT Evaluation: No evidence of DVT seen on physical exam.  Intrapartum Procedures: cesarean: low cervical, transverse Postpartum Procedures: none Complications-Operative and Postpartum: none  Discharge Diagnoses: 37 2/7 weeks, previous classical C/S, repeat C/S, anemia  Discharge Information: Routine post op C/S  Activity:           pelvic rest Diet:                routine Medications: Ibuprofen, Vicodin and Micronor Condition:      stable Instructions:  Discharge to: home  Follow-up Information    Follow up with [facility]. Schedule an appointment as soon as possible for a visit in 6 weeks.   Specialty:  Obstetrics and  Gynecology   Why:  Call for any questions or concerns.   Contact information:   [address] [place] [address] [phone]       Lonald Troiani, [place] 02/01/2015 5:16 PM

## 2015-08-30 ENCOUNTER — Emergency Department (INDEPENDENT_AMBULATORY_CARE_PROVIDER_SITE_OTHER): Payer: Medicaid Other

## 2015-08-30 ENCOUNTER — Encounter (HOSPITAL_COMMUNITY): Payer: Self-pay | Admitting: *Deleted

## 2015-08-30 ENCOUNTER — Emergency Department (HOSPITAL_COMMUNITY)
Admission: EM | Admit: 2015-08-30 | Discharge: 2015-08-30 | Disposition: A | Discharge status: Home / Self Care | Payer: Medicaid Other | Source: Home / Self Care | Attending: Family Medicine | Admitting: Family Medicine

## 2015-08-30 DIAGNOSIS — S93401A Sprain of unspecified ligament of right ankle, initial encounter: Secondary | ICD-10-CM

## 2015-08-30 NOTE — Discharge Instructions (Signed)
Wear ankle support as needed for comfort, activity as tolerated. advil and ice as needed, return or see orthopedist if further problems. °

## 2015-08-30 NOTE — ED Notes (Signed)
Reports      [address]     Down          Some  Steps  And  Injured        Her  r  Ankle             She         Also has  Some  Pain  r  Rib  Area   Worse  When  She  Takes  A  Deep breath       She  Reports  Pain on  Weight  Bearing          she  Is  Sitting  Upright on the  Exam table  Speaking in  Complete  sentances

## 2015-08-30 NOTE — ED Provider Notes (Signed)
CSN: [id number]     Arrival date & time 08/30/15  1551 History   First MD Initiated Contact with Patient 08/30/15 1650     Chief Complaint  Patient presents with  . Fall   (Consider location/radiation/quality/duration/timing/severity/associated sxs/prior Treatment) Patient is a 29 y.o. female presenting with ankle pain. The history is provided by the patient.  Ankle Pain Location:  Ankle Time since incident:  1 day Injury: yes   Mechanism of injury: fall   Fall:    Fall occurred:  Down stairs   Impact surface:  Concrete   Entrapped after fall: no   Ankle location:  R ankle Pain details:    Quality:  Throbbing   Radiates to:  RUQ   Severity:  Mild   Onset quality:  Gradual   Progression:  Unchanged Chronicity:  New Dislocation: no   Foreign body present:  No foreign bodies Prior injury to area:  No Associated symptoms: decreased ROM and swelling     Past Medical History  Diagnosis Date  . Anemia   . Abnormal Pap smear   . Asthma     as a child, "outgrew it"   Past Surgical History  Procedure Laterality Date  . Cesarean section      2010  . Cervical cerclage N/A 10/14/2014    Procedure: CERCLAGE CERVICAL;  Surgeon: Delice Lesch, MD;  Location: [facility];  Service: Gynecology;  Laterality: N/A;  . Facial reconstruction surgery    . Cesarean section N/A 01/29/2015    Procedure: CESAREAN SECTION;  Surgeon: Everett Graff, MD;  Location: [facility];  Service: Obstetrics;  Laterality: N/A;   Family History  Problem Relation Age of Onset  . Stroke Mother   . Diabetes Mother   . Hypertension Mother   . Coronary artery disease Mother   . Hypertension Father    Social History  Substance Use Topics  . Smoking status: Never Smoker   . Smokeless tobacco: Never Used  . Alcohol Use: Yes     Comment: occaisionally, not currently with pregnancy   OB History    Gravida Para Term Preterm AB TAB SAB Ectopic Multiple Living   3 2 1 1 1  1   0 2     Review of Systems   Constitutional: Negative.   Cardiovascular: Positive for chest pain.  Musculoskeletal: Positive for joint swelling.  All other systems reviewed and are negative.   Allergies  Oxycodone hcl; Terconazole; and Latex  Home Medications   Prior to Admission medications   Medication Sig Start Date End Date Taking? Authorizing Provider  HYDROcodone-acetaminophen (NORCO/VICODIN) 5-325 MG per tablet Take 1 tablet by mouth every 4 (four) hours as needed for moderate pain. 02/01/15   Donnel Saxon, CNM  ibuprofen (ADVIL,MOTRIN) 600 MG tablet Take 1 tablet (600 mg total) by mouth every 6 (six) hours as needed. 02/01/15   Donnel Saxon, CNM  norethindrone (ORTHO MICRONOR) 0.35 MG tablet Take 1 tablet (0.35 mg total) by mouth daily. 02/01/15   Donnel Saxon, CNM  Prenatal Vit-Fe Fumarate-FA (PRENATAL MULTIVITAMIN) TABS tablet Take 1 tablet by mouth daily at 12 noon.    Historical Provider, MD   Meds Ordered and Administered this Visit  Medications - No data to display  BP 141/73 mmHg  Pulse 84  Temp(Src) 98.3 F (36.8 C) (Oral)  Resp 16  SpO2 98%  LMP 08/05/2015 No data found.   Physical Exam  Constitutional: She is oriented to person, place, and time. She appears well-developed and well-nourished.  No distress.  Musculoskeletal: She exhibits tenderness.       Right ankle: She exhibits swelling and ecchymosis. She exhibits no deformity and normal pulse. Tenderness. Lateral malleolus tenderness found. No AITFL, no head of 5th metatarsal and no proximal fibula tenderness found. Achilles tendon normal. Achilles tendon exhibits no pain, no defect and normal Thompson's test results.  Neurological: She is alert and oriented to person, place, and time.  Skin: Skin is warm and dry.  Nursing note and vitals reviewed.   ED Course  Procedures (including critical care time)  Labs Review Labs Reviewed - No data to display  Imaging Review Dg Ankle Complete Right  08/30/2015  CLINICAL DATA:  Fall,  ankle pain/swelling EXAM: RIGHT ANKLE - COMPLETE 3+ VIEW COMPARISON:  None. FINDINGS: No fracture or dislocation is seen. The ankle mortise is intact. The base of the fifth metatarsal is unremarkable. Mild lateral soft tissue swelling. IMPRESSION: No fracture or dislocation is seen. Mild lateral soft tissue swelling. Electronically Signed   By: Julian Hy M.D.   On: 08/30/2015 17:09   X-rays reviewed and report per radiologist.   Visual Acuity Review  Right Eye Distance:   Left Eye Distance:   Bilateral Distance:    Right Eye Near:   Left Eye Near:    Bilateral Near:         MDM   1. Ankle sprain, right, initial encounter        Billy Fischer, MD 08/30/15 [phone]

## 2015-10-27 ENCOUNTER — Other Ambulatory Visit: Payer: Self-pay | Admitting: Obstetrics and Gynecology

## 2015-10-27 DIAGNOSIS — E049 Nontoxic goiter, unspecified: Secondary | ICD-10-CM

## 2015-11-05 ENCOUNTER — Other Ambulatory Visit: Payer: Medicaid Other

## 2015-11-10 ENCOUNTER — Other Ambulatory Visit: Payer: Medicaid Other

## 2015-11-30 ENCOUNTER — Ambulatory Visit
Admission: RE | Admit: 2015-11-30 | Discharge: 2015-11-30 | Disposition: A | Discharge status: Home / Self Care | Payer: Medicaid Other | Source: Ambulatory Visit | Attending: Obstetrics and Gynecology | Admitting: Obstetrics and Gynecology

## 2015-11-30 DIAGNOSIS — E049 Nontoxic goiter, unspecified: Secondary | ICD-10-CM

## 2016-08-19 ENCOUNTER — Emergency Department (HOSPITAL_COMMUNITY): Payer: Medicaid Other

## 2016-08-19 ENCOUNTER — Encounter (HOSPITAL_COMMUNITY): Payer: Self-pay | Admitting: *Deleted

## 2016-08-19 ENCOUNTER — Emergency Department (HOSPITAL_COMMUNITY)
Admission: EM | Admit: 2016-08-19 | Discharge: 2016-08-19 | Disposition: A | Discharge status: Home / Self Care | Payer: Medicaid Other | Attending: Emergency Medicine | Admitting: Emergency Medicine

## 2016-08-19 DIAGNOSIS — J45909 Unspecified asthma, uncomplicated: Secondary | ICD-10-CM | POA: Insufficient documentation

## 2016-08-19 DIAGNOSIS — R490 Dysphonia: Secondary | ICD-10-CM | POA: Insufficient documentation

## 2016-08-19 DIAGNOSIS — R05 Cough: Secondary | ICD-10-CM | POA: Insufficient documentation

## 2016-08-19 DIAGNOSIS — R079 Chest pain, unspecified: Secondary | ICD-10-CM | POA: Diagnosis not present

## 2016-08-19 DIAGNOSIS — Z91040 Latex allergy status: Secondary | ICD-10-CM | POA: Insufficient documentation

## 2016-08-19 DIAGNOSIS — R059 Cough, unspecified: Secondary | ICD-10-CM

## 2016-08-19 LAB — CBC WITH DIFFERENTIAL/PLATELET
BASOS ABS: 0 10*3/uL (ref 0.0–0.1)
BASOS PCT: 0 %
EOS ABS: 0.2 10*3/uL (ref 0.0–0.7)
Eosinophils Relative: 2 %
HEMATOCRIT: 31 % — AB (ref 36.0–46.0)
HEMOGLOBIN: 9.2 g/dL — AB (ref 12.0–15.0)
LYMPHS PCT: 27 %
Lymphs Abs: 2.8 10*3/uL (ref 0.7–4.0)
MCH: 17.9 pg — AB (ref 26.0–34.0)
MCHC: 29.7 g/dL — AB (ref 30.0–36.0)
MCV: 60.3 fL — ABNORMAL LOW (ref 78.0–100.0)
MONOS PCT: 8 %
Monocytes Absolute: 0.8 10*3/uL (ref 0.1–1.0)
NEUTROS ABS: 6.5 10*3/uL (ref 1.7–7.7)
NEUTROS PCT: 63 %
Platelets: 332 10*3/uL (ref 150–400)
RBC: 5.14 MIL/uL — ABNORMAL HIGH (ref 3.87–5.11)
RDW: 18.4 % — ABNORMAL HIGH (ref 11.5–15.5)
WBC: 10.3 10*3/uL (ref 4.0–10.5)

## 2016-08-19 LAB — BASIC METABOLIC PANEL
ANION GAP: 7 (ref 5–15)
BUN: 7 mg/dL (ref 6–20)
CO2: 25 mmol/L (ref 22–32)
Calcium: 9.1 mg/dL (ref 8.9–10.3)
Chloride: 107 mmol/L (ref 101–111)
Creatinine, Ser: 0.66 mg/dL (ref 0.44–1.00)
GFR calc non Af Amer: >60 mL/min (ref >60)
GLUCOSE: 92 mg/dL (ref 65–99)
POTASSIUM: 4 mmol/L (ref 3.5–5.1)
Sodium: 139 mmol/L (ref 135–145)

## 2016-08-19 LAB — I-STAT BETA HCG BLOOD, ED (MC, WL, AP ONLY)

## 2016-08-19 LAB — I-STAT TROPONIN, ED: Troponin i, poc: 0 ng/mL (ref 0.00–0.08)

## 2016-08-19 MED ORDER — FENTANYL CITRATE (PF) 100 MCG/2ML IJ SOLN
50.0000 ug | Freq: Once | INTRAMUSCULAR | Status: DC
Start: 2016-08-19 — End: 2016-08-19

## 2016-08-19 MED ORDER — IBUPROFEN 800 MG PO TABS
800.0000 mg | ORAL_TABLET | Freq: Once | ORAL | Status: AC
  Administered 2016-08-19: 800 mg via ORAL
  Filled 2016-08-19: qty 1

## 2016-08-19 NOTE — ED Provider Notes (Signed)
[place] [facility] DEPT Provider Note   CSN: [id number] Arrival date & time: 08/19/16  [id number]     History   Chief Complaint Chief Complaint  Patient presents with  . Chest Pain    HPI Brenda [name] is a 30 y.o. female.  30yo F who p/w chest pain. PT states around 10-11pm last night she began having hoarseness. She has had associated scratchy throat. She woke up around 3am w/ cough productive of yellow mucus and chest pain when she coughs. No CP at rest or associated with exertion. CP is non-radiating, not pleuritic, not changed by position. No SOB, nausea/vomiting, or diaphoresis. No fevers/chills, syncope, dizziness. She has had chronic swelling in R foot for many years but it is not painful and not changed recently. No other leg swelling or pain, recent travel, h/o OCP use, h/o cancer, or family hx of blood clots. No FH of early heart disease. Daughter is currently sick with cough/URI symptoms.    The history is provided by the patient.  Chest Pain      Past Medical History:  Diagnosis Date  . Abnormal Pap smear   . Anemia   . Asthma    as a child, "outgrew it"    Patient Active Problem List   Diagnosis Date Noted  . S/P repeat low transverse C-section 01/29/2015  . IUGR (intrauterine growth restriction) affecting care of mother   . SIRS (systemic inflammatory response syndrome) ([place]) 04/24/2014  . Anemia 09/01/2012    Past Surgical History:  Procedure Laterality Date  . CERVICAL CERCLAGE N/A 10/14/2014   Procedure: CERCLAGE CERVICAL;  Surgeon: Delice Lesch, MD;  Location: [facility];  Service: Gynecology;  Laterality: N/A;  . CESAREAN SECTION     2010  . CESAREAN SECTION N/A 01/29/2015   Procedure: CESAREAN SECTION;  Surgeon: Everett Graff, MD;  Location: [facility];  Service: Obstetrics;  Laterality: N/A;  . FACIAL RECONSTRUCTION SURGERY      OB History    Gravida Para Term Preterm AB Living   3 2 1 1 1 2    SAB TAB Ectopic Multiple Live Births   1     0 2        Home Medications    Prior to Admission medications   Medication Sig Start Date End Date Taking? Authorizing Provider  ibuprofen (ADVIL,MOTRIN) 200 MG tablet Take 400 mg by mouth every 6 (six) hours as needed for headache or moderate pain.   Yes Historical Provider, MD  Multiple Vitamins-Minerals (MULTIVITAMIN PO) Take 1 tablet by mouth daily.   Yes Historical Provider, MD  HYDROcodone-acetaminophen (NORCO/VICODIN) 5-325 MG per tablet Take 1 tablet by mouth every 4 (four) hours as needed for moderate pain. Patient not taking: Reported on 08/19/2016 02/01/15   Donnel Saxon, CNM  ibuprofen (ADVIL,MOTRIN) 600 MG tablet Take 1 tablet (600 mg total) by mouth every 6 (six) hours as needed. Patient not taking: Reported on 08/19/2016 02/01/15   Donnel Saxon, CNM  norethindrone (ORTHO MICRONOR) 0.35 MG tablet Take 1 tablet (0.35 mg total) by mouth daily. Patient not taking: Reported on 08/19/2016 02/01/15   Donnel Saxon, CNM    Family History Family History  Problem Relation Age of Onset  . Stroke Mother   . Diabetes Mother   . Hypertension Mother   . Coronary artery disease Mother   . Hypertension Father     Social History Social History  Substance Use Topics  . Smoking status: Never Smoker  . Smokeless tobacco: Never Used  .  Alcohol use Yes     Comment: occaisionally, not currently with pregnancy     Allergies   Oxycodone hcl; Latex; and Terconazole   Review of Systems Review of Systems  Cardiovascular: Positive for chest pain.   10 Systems reviewed and are negative for acute change except as noted in the HPI.   Physical Exam Updated Vital Signs BP 116/61   Pulse 76   Temp 97.4 F (36.3 C) (Oral)   Resp 18   LMP 08/14/2016   SpO2 99%   Physical Exam  Constitutional: She is oriented to person, place, and time. She appears well-developed and well-nourished. No distress.  HENT:  Head: Normocephalic and atraumatic.  Mouth/Throat: Oropharynx is clear and moist.  Moist  mucous membranes  Eyes: Conjunctivae are normal. Pupils are equal, round, and reactive to light.  Neck: Neck supple.  Cardiovascular: Normal rate, regular rhythm and normal heart sounds.   No murmur heard. Pulmonary/Chest: Effort normal and breath sounds normal.  Abdominal: Soft. Bowel sounds are normal. She exhibits no distension. There is no tenderness.  Musculoskeletal:  Mild edema R foot, non-tender; no leg swelling  Neurological: She is alert and oriented to person, place, and time.  Fluent speech  Skin: Skin is warm and dry.  Psychiatric: She has a normal mood and affect. Judgment normal.  Nursing note and vitals reviewed.    ED Treatments / Results  Labs (all labs ordered are listed, but only abnormal results are displayed) Labs Reviewed  CBC WITH DIFFERENTIAL/PLATELET - Abnormal; Notable for the following:       Result Value   RBC 5.14 (*)    Hemoglobin 9.2 (*)    HCT 31.0 (*)    MCV 60.3 (*)    MCH 17.9 (*)    MCHC 29.7 (*)    RDW 18.4 (*)    All other components within normal limits  BASIC METABOLIC PANEL  I-STAT BETA HCG BLOOD, ED ([facility], [facility], [facility] ONLY)  I-STAT TROPOININ, ED    EKG  EKG Interpretation None       Radiology Dg Chest 2 View  Result Date: 08/19/2016 CLINICAL DATA:  Chest pain and hoarseness. EXAM: CHEST  2 VIEW COMPARISON:  April 24, 2014 FINDINGS: Stable cardiomegaly. No other interval changes or acute abnormalities. IMPRESSION: Stable cardiomegaly.  No other abnormalities. Electronically Signed   By: Dorise Bullion III M.D   On: 08/19/2016 09:11    Procedures Procedures (including critical care time)  Medications Ordered in ED Medications  ibuprofen (ADVIL,MOTRIN) tablet 800 mg (not administered)     Initial Impression / Assessment and Plan / ED Course  I have reviewed the triage vital signs and the nursing notes.  Pertinent labs & imaging results that were available during my care of the patient were reviewed by me and considered in  my medical decision making (see chart for details).  Clinical Course    PT w/ hoarseness and sore throat last night, now with cough and chest pain associated with coughing but not at rest and no SOB. She was pleasant, well-appearing and in NAD. Clear BS b/l, normal WOB. VS unremarkable.   CXR negative, EKG Without acute ischemic changes. Troponin negative. She is mildly anemic which is consistent with previous lab work and I have instructed her to restart iron supplementation.  She has no risk factors for PE and is PERC negative. Other than obesity, no major risk factors for cardiac disease and given CP only occurs with cough, ACS is very unlikely.  Sx c/w viral syndrome and pt has + sick contact. I have discussed supportive care and reviewed return precautions including worsening/change in CP, SOB, or new sx. Pt voiced understanding and was discharged in satisfactory condition.  Final Clinical Impressions(s) / ED Diagnoses   Final diagnoses:  Cough  Chest pain, unspecified type  Hoarseness    New Prescriptions New Prescriptions   No medications on file     Sharlett Iles, MD 08/19/16 1008

## 2016-08-19 NOTE — ED Notes (Signed)
Returns from xray

## 2016-08-19 NOTE — ED Notes (Signed)
Pt at bedside. Stands and moves without hesitation.

## 2016-08-19 NOTE — ED Notes (Signed)
Pt states she understands instructions. Home stable with steady gait. All questions answered.

## 2016-08-19 NOTE — ED Triage Notes (Signed)
The pt is c/o chest pain when she woke up  Around 2300 she has hoarseness she reports that she does not have a cold  lmp monday

## 2016-11-03 ENCOUNTER — Emergency Department (HOSPITAL_COMMUNITY)
Admission: EM | Admit: 2016-11-03 | Discharge: 2016-11-04 | Disposition: A | Discharge status: Home / Self Care | Payer: Medicaid Other | Attending: Emergency Medicine | Admitting: Emergency Medicine

## 2016-11-03 ENCOUNTER — Encounter (HOSPITAL_COMMUNITY): Payer: Self-pay

## 2016-11-03 DIAGNOSIS — J45909 Unspecified asthma, uncomplicated: Secondary | ICD-10-CM | POA: Insufficient documentation

## 2016-11-03 DIAGNOSIS — K0889 Other specified disorders of teeth and supporting structures: Secondary | ICD-10-CM | POA: Diagnosis present

## 2016-11-03 DIAGNOSIS — K029 Dental caries, unspecified: Secondary | ICD-10-CM | POA: Diagnosis not present

## 2016-11-03 DIAGNOSIS — Z91040 Latex allergy status: Secondary | ICD-10-CM | POA: Diagnosis not present

## 2016-11-03 DIAGNOSIS — K047 Periapical abscess without sinus: Secondary | ICD-10-CM

## 2016-11-03 MED ORDER — AMOXICILLIN 500 MG PO CAPS: 500.0000 mg | ORAL_CAPSULE | Freq: Three times a day (TID) | ORAL | 0 refills | Status: DC

## 2016-11-03 MED ORDER — NAPROXEN 500 MG PO TABS: 500.0000 mg | ORAL_TABLET | Freq: Two times a day (BID) | ORAL | 0 refills | Status: DC

## 2016-11-03 MED ORDER — AMOXICILLIN 500 MG PO CAPS
500.0000 mg | ORAL_CAPSULE | Freq: Once | ORAL | Status: AC
  Administered 2016-11-04: 500 mg via ORAL
  Filled 2016-11-03: qty 1

## 2016-11-03 NOTE — ED Provider Notes (Signed)
[facility] DEPT Provider Note   CSN: [id number] Arrival date & time: 11/03/16  2047   By signing my name below, I, Soijett Chico, attest that this documentation has been prepared under the direction and in the presence of Debroah Baller, NP Electronically Signed: Soijett Blue, ED Scribe. 11/03/16. 11:55 PM.  History   Chief Complaint Chief Complaint  Patient presents with  . Dental Pain    HPI Brenda [name] is a 31 y.o. female who presents to the Emergency Department complaining of 9/10, right upper back dental pain onset 2 days ago. Pt reports associated right sided facial swelling. Pt hasn't tried any medications for the relief of her symptoms. Pt notes that she has a dental appointment in 3 days for extraction of the affected tooth. She denies fever, chills, drainage, abdominal pain, nausea, vomiting, and any other symptoms.    The history is provided by the patient. No language interpreter was used.    Past Medical History:  Diagnosis Date  . Abnormal Pap smear   . Anemia   . Asthma    as a child, "outgrew it"    Patient Active Problem List   Diagnosis Date Noted  . S/P repeat low transverse C-section 01/29/2015  . IUGR (intrauterine growth restriction) affecting care of mother   . SIRS (systemic inflammatory response syndrome) ([place]) 04/24/2014  . Anemia 09/01/2012    Past Surgical History:  Procedure Laterality Date  . CERVICAL CERCLAGE N/A 10/14/2014   Procedure: CERCLAGE CERVICAL;  Surgeon: Delice Lesch, MD;  Location: [facility];  Service: Gynecology;  Laterality: N/A;  . CESAREAN SECTION     2010  . CESAREAN SECTION N/A 01/29/2015   Procedure: CESAREAN SECTION;  Surgeon: Everett Graff, MD;  Location: [facility];  Service: Obstetrics;  Laterality: N/A;  . FACIAL RECONSTRUCTION SURGERY      OB History    Gravida Para Term Preterm AB Living   3 2 1 1 1 2    SAB TAB Ectopic Multiple Live Births   1     0 2       Home Medications    Prior to Admission  medications   Medication Sig Start Date End Date Taking? Authorizing Provider  amoxicillin (AMOXIL) 500 MG capsule Take 1 capsule (500 mg total) by mouth 3 (three) times daily. 11/03/16   Hope Bunnie Pion, NP  Multiple Vitamins-Minerals (MULTIVITAMIN PO) Take 1 tablet by mouth daily.    Historical Provider, MD  naproxen (NAPROSYN) 500 MG tablet Take 1 tablet (500 mg total) by mouth 2 (two) times daily. 11/03/16   Hope Bunnie Pion, NP  norethindrone (ORTHO MICRONOR) 0.35 MG tablet Take 1 tablet (0.35 mg total) by mouth daily. Patient not taking: Reported on 08/19/2016 02/01/15   Donnel Saxon, CNM    Family History Family History  Problem Relation Age of Onset  . Stroke Mother   . Diabetes Mother   . Hypertension Mother   . Coronary artery disease Mother   . Hypertension Father     Social History Social History  Substance Use Topics  . Smoking status: Never Smoker  . Smokeless tobacco: Never Used  . Alcohol use Yes     Comment: occaisionally, not currently with pregnancy     Allergies   Oxycodone hcl; Latex; and Terconazole   Review of Systems Review of Systems  Constitutional: Negative for chills and fever.  HENT: Positive for dental problem (right upper back) and facial swelling (right sided).   Gastrointestinal: Negative for abdominal  pain, nausea and vomiting.     Physical Exam Updated Vital Signs BP 135/91 (BP Location: Right Arm)   Pulse 78   Temp 98.4 F (36.9 C) (Oral)   Resp 14   SpO2 99%   Physical Exam  Constitutional: She is oriented to person, place, and time. She appears well-developed and well-nourished. No distress.  HENT:  Head: Normocephalic and atraumatic.  Right Ear: Tympanic membrane and ear canal normal.  Left Ear: Tympanic membrane and ear canal normal.  Mouth/Throat: Uvula is midline, oropharynx is clear and moist and mucous membranes are normal. No posterior oropharyngeal edema or posterior oropharyngeal erythema.  Right upper second and third molar  with swelling of surrounding gum and erythema. Minimal swelling of right side of face.  Eyes: EOM are normal.  Neck: Neck supple.  Cardiovascular: Normal rate and regular rhythm.   Pulmonary/Chest: Effort normal and breath sounds normal.  Abdominal: Soft. There is no tenderness.  Musculoskeletal: Normal range of motion.  Neurological: She is alert and oriented to person, place, and time.  Skin: Skin is warm and dry.  Psychiatric: She has a normal mood and affect. Her behavior is normal.  Nursing note and vitals reviewed.    ED Treatments / Results  DIAGNOSTIC STUDIES: Oxygen Saturation is 99% on RA, nl by my interpretation.    COORDINATION OF CARE: 11:52 PM Discussed treatment plan with pt at bedside which includes amoxil Rx, naprosyn Rx, and pt agreed to plan.  Procedures Procedures (including critical care time)  Medications Ordered in ED Medications  amoxicillin (AMOXIL) capsule 500 mg (500 mg Oral Given 11/04/16 0002)     Initial Impression / Assessment and Plan / ED Course  I have reviewed the triage vital signs and the nursing notes.  Patient with dentalgia. No abscess requiring immediate incision and drainage.  Exam not concerning for Ludwig's angina or pharyngeal abscess. Will treat with amoxil Rx and naprosyn Rx. Pt instructed to follow-up with dentist. Discussed return precautions. Pt safe for discharge.  Final Clinical Impressions(s) / ED Diagnoses   Final diagnoses:  Infected dental caries    New Prescriptions Discharge Medication List as of 11/03/2016 11:56 PM    START taking these medications   Details  amoxicillin (AMOXIL) 500 MG capsule Take 1 capsule (500 mg total) by mouth 3 (three) times daily., Starting Fri 11/03/2016, Print    naproxen (NAPROSYN) 500 MG tablet Take 1 tablet (500 mg total) by mouth 2 (two) times daily., Starting Fri 11/03/2016, Print       I personally performed the services described in this documentation, which was scribed in my  presence. The recorded information has been reviewed and is accurate.     [address] [place], [place] 11/05/16 [place], MD 11/05/16 [phone]

## 2016-11-03 NOTE — ED Triage Notes (Signed)
Pt reports dental pain, pain to top back right wisdom tooth. She is scheduled to have it extracted on Monday but is requesting pain medication or abx tonight for it. Jaw appears slightly swollen.

## 2016-11-03 NOTE — Discharge Instructions (Signed)
Follow up with the dentist on Monday as scheduled.

## 2016-11-03 NOTE — ED Notes (Addendum)
Pt states going to dentist Monday, but pain became too bad to wait until appointment. Pt speaking in complete sentences. In NAD. Swelling and redness noted near molars on top of mouth.

## 2016-11-06 ENCOUNTER — Emergency Department (HOSPITAL_COMMUNITY)
Admission: EM | Admit: 2016-11-06 | Discharge: 2016-11-06 | Disposition: A | Discharge status: Home / Self Care | Payer: Medicaid Other | Attending: Emergency Medicine | Admitting: Emergency Medicine

## 2016-11-06 ENCOUNTER — Encounter (HOSPITAL_COMMUNITY): Payer: Self-pay | Admitting: Emergency Medicine

## 2016-11-06 DIAGNOSIS — R030 Elevated blood-pressure reading, without diagnosis of hypertension: Secondary | ICD-10-CM | POA: Diagnosis not present

## 2016-11-06 DIAGNOSIS — G8918 Other acute postprocedural pain: Secondary | ICD-10-CM | POA: Diagnosis not present

## 2016-11-06 DIAGNOSIS — Z91040 Latex allergy status: Secondary | ICD-10-CM | POA: Diagnosis not present

## 2016-11-06 DIAGNOSIS — J45909 Unspecified asthma, uncomplicated: Secondary | ICD-10-CM | POA: Insufficient documentation

## 2016-11-06 DIAGNOSIS — K1379 Other lesions of oral mucosa: Secondary | ICD-10-CM | POA: Insufficient documentation

## 2016-11-06 MED ORDER — ACETAMINOPHEN 500 MG PO TABS
1000.0000 mg | ORAL_TABLET | Freq: Once | ORAL | Status: AC
  Administered 2016-11-06: 1000 mg via ORAL
  Filled 2016-11-06: qty 2

## 2016-11-06 NOTE — Discharge Instructions (Signed)
Take Tylenol for mild pain or the Ultram prescribed for bad pain. Continue the amoxicillin. Follow-up with Dr.Gollahan tomorrow if not feeling well. Return if concern for any reason . Your blood pressure should be rechecked within the next 3 weeks. Today's was mildly elevated at 157/89

## 2016-11-06 NOTE — ED Provider Notes (Addendum)
[facility] DEPT Provider Note   CSN: [id number] Arrival date & time: 11/06/16  1649     History   Chief Complaint Chief Complaint  Patient presents with  . Fever    HPI Brenda [name] is a 31 y.o. female.Patient reports that she had 10 teeth pulled earlier today. After the event she felt weak generally and was told by EMS that she had a fever. She complains of pain in her mouth. No other associated symptoms. No treatment prior to coming here she was prescribed Ultram today which she did not fill. She is also been on amoxicillin for recent dental infection. she presently feels improved over earlier today  HPI  Past Medical History:  Diagnosis Date  . Abnormal Pap smear   . Anemia   . Asthma    as a child, "outgrew it"    Patient Active Problem List   Diagnosis Date Noted  . S/P repeat low transverse C-section 01/29/2015  . IUGR (intrauterine growth restriction) affecting care of mother   . SIRS (systemic inflammatory response syndrome) ([place]) 04/24/2014  . Anemia 09/01/2012    Past Surgical History:  Procedure Laterality Date  . CERVICAL CERCLAGE N/A 10/14/2014   Procedure: CERCLAGE CERVICAL;  Surgeon: Delice Lesch, MD;  Location: [facility];  Service: Gynecology;  Laterality: N/A;  . CESAREAN SECTION     2010  . CESAREAN SECTION N/A 01/29/2015   Procedure: CESAREAN SECTION;  Surgeon: Everett Graff, MD;  Location: [facility];  Service: Obstetrics;  Laterality: N/A;  . FACIAL RECONSTRUCTION SURGERY      OB History    Gravida Para Term Preterm AB Living   3 2 1 1 1 2    SAB TAB Ectopic Multiple Live Births   1     0 2       Home Medications    Prior to Admission medications   Medication Sig Start Date End Date Taking? Authorizing Provider  amoxicillin (AMOXIL) 500 MG capsule Take 1 capsule (500 mg total) by mouth 3 (three) times daily. 11/03/16   Hope Bunnie Pion, NP  Multiple Vitamins-Minerals (MULTIVITAMIN PO) Take 1 tablet by mouth daily.    Historical Provider,  MD  naproxen (NAPROSYN) 500 MG tablet Take 1 tablet (500 mg total) by mouth 2 (two) times daily. 11/03/16   Hope Bunnie Pion, NP  norethindrone (ORTHO MICRONOR) 0.35 MG tablet Take 1 tablet (0.35 mg total) by mouth daily. Patient not taking: Reported on 08/19/2016 02/01/15   Donnel Saxon, CNM    Family History Family History  Problem Relation Age of Onset  . Stroke Mother   . Diabetes Mother   . Hypertension Mother   . Coronary artery disease Mother   . Hypertension Father     Social History Social History  Substance Use Topics  . Smoking status: Never Smoker  . Smokeless tobacco: Never Used  . Alcohol use Yes     Comment: occaisionally, not currently with pregnancy   Denies illicit drug use  Allergies   Oxycodone hcl; Latex; and Terconazole   Review of Systems Review of Systems  Constitutional: Positive for fever.  HENT:       Oral pain  Neurological: Positive for weakness.       Generalized weakness  All other systems reviewed and are negative.    Physical Exam Updated Vital Signs BP 157/89 (BP Location: Right Arm)   Pulse 84   Temp 99 F (37.2 C) (Oral)   Resp 18   Ht 5'  9" (1.753 m)   Wt 300 lb (136.1 kg)   LMP 10/10/2016 (Exact Date)   SpO2 99%   BMI 44.30 kg/m   Physical Exam  Constitutional: She is oriented to person, place, and time. She appears well-developed and well-nourished. No distress.  HENT:  Head: Normocephalic and atraumatic.  Right Ear: External ear normal.  Left Ear: External ear normal.  Mouth/Throat: Oropharynx is clear and moist.  Several teeth upper and lower are missing sockets appear clean there is no swelling of the gingiva. No active bleeding  Eyes: Conjunctivae are normal. Pupils are equal, round, and reactive to light.  Neck: Neck supple. No tracheal deviation present. No thyromegaly present.  Cardiovascular: Normal rate, regular rhythm and normal heart sounds.   No murmur heard. Pulmonary/Chest: Effort normal and breath sounds  normal.  Abdominal: Soft. Bowel sounds are normal. She exhibits no distension. There is no tenderness.  Obese  Musculoskeletal: Normal range of motion. She exhibits no edema or tenderness.  Lymphadenopathy:    She has no cervical adenopathy.  Neurological: She is alert and oriented to person, place, and time. Coordination normal.  gait normal not lightheaded on standing  Skin: Skin is warm and dry. No rash noted.  Psychiatric: She has a normal mood and affect.  Nursing note and vitals reviewed.    ED Treatments / Results  Labs (all labs ordered are listed, but only abnormal results are displayed) Labs Reviewed - No data to display  EKG  EKG Interpretation None       Radiology No results found.  Procedures Procedures (including critical care time)  Medications Ordered in ED Medications  acetaminophen (TYLENOL) tablet 1,000 mg (1,000 mg Oral Given 11/06/16 2109)     Initial Impression / Assessment and Plan / ED Course  I have reviewed the triage vital signs and the nursing notes.  Pertinent labs & imaging results that were available during my care of the patient were reviewed by me and considered in my medical decision making (see chart for details).     No further diagnostic testing needed. Patient has postoperative pain and has not had documented fever  . No antipyretics have been administered. She was given Tylenol here for pain. She has prescription for Ultram waiting for her at pharmacy. She declined Ultram here. She is instructed to follow-up with Dr.Gollahan as needed tomorrow  Final Clinical Impressions(s) / ED Diagnoses  Diagnosis #1postoperative pain  #2Elevated blood pressure Final diagnoses:  None    New Prescriptions New Prescriptions   No medications on file     Orlie Dakin, MD 11/06/16 2140    Orlie Dakin, MD 11/06/16 2143

## 2016-11-06 NOTE — ED Notes (Signed)
Patient up to desk.  Asking for pain medication.  Informed pt she had a room and would be medicated in back.  Patient grateful for update.

## 2016-11-06 NOTE — ED Triage Notes (Signed)
Pt to ED with c/o fever.  Pt st's she had 10 teeth pulled this am now c/o elevated temp and feeling weak.

## 2016-11-06 NOTE — ED Notes (Signed)
Introduced self in lobby.  Offered warm blankets and explanations for delays in rooming.

## 2016-11-06 NOTE — ED Notes (Signed)
Patient verbalized understanding of discharge instructions and denies any further needs or questions at this time. VS stable. Patient ambulatory with steady gait.  

## 2017-05-06 ENCOUNTER — Emergency Department (HOSPITAL_COMMUNITY)
Admission: EM | Admit: 2017-05-06 | Discharge: 2017-05-06 | Disposition: A | Discharge status: Home / Self Care | Payer: Medicaid Other | Attending: Emergency Medicine | Admitting: Emergency Medicine

## 2017-05-06 ENCOUNTER — Encounter (HOSPITAL_COMMUNITY): Payer: Self-pay | Admitting: Emergency Medicine

## 2017-05-06 DIAGNOSIS — S70361A Insect bite (nonvenomous), right thigh, initial encounter: Secondary | ICD-10-CM | POA: Diagnosis present

## 2017-05-06 DIAGNOSIS — W57XXXA Bitten or stung by nonvenomous insect and other nonvenomous arthropods, initial encounter: Secondary | ICD-10-CM | POA: Insufficient documentation

## 2017-05-06 DIAGNOSIS — Y92019 Unspecified place in single-family (private) house as the place of occurrence of the external cause: Secondary | ICD-10-CM | POA: Diagnosis not present

## 2017-05-06 DIAGNOSIS — Y9389 Activity, other specified: Secondary | ICD-10-CM | POA: Insufficient documentation

## 2017-05-06 DIAGNOSIS — Z91040 Latex allergy status: Secondary | ICD-10-CM | POA: Insufficient documentation

## 2017-05-06 DIAGNOSIS — Z79899 Other long term (current) drug therapy: Secondary | ICD-10-CM | POA: Diagnosis not present

## 2017-05-06 DIAGNOSIS — Y998 Other external cause status: Secondary | ICD-10-CM | POA: Insufficient documentation

## 2017-05-06 DIAGNOSIS — I10 Essential (primary) hypertension: Secondary | ICD-10-CM | POA: Diagnosis not present

## 2017-05-06 MED ORDER — PREDNISONE 10 MG PO TABS: 20.0000 mg | ORAL_TABLET | Freq: Every day | ORAL | 0 refills | Status: DC

## 2017-05-06 NOTE — Discharge Instructions (Signed)
Take benadryl over the counter 25 mg every 8 hours until symptoms resolve.  Return if any signs of worsening allergic reaction such as difficulty breathing or vomiting.  Recheck of your blood pressure with your doctor this week.

## 2017-05-06 NOTE — ED Triage Notes (Signed)
Pt presents to ED for bug bite to right thigh yesterday morning.  MD at bedside assessing at this time

## 2017-05-06 NOTE — ED Provider Notes (Signed)
[facility] DEPT Provider Note   CSN: [id number] Arrival date & time: 05/06/17  2118     History   Chief Complaint Chief Complaint  Patient presents with  . Insect Bite    HPI Brenda [name] is a 31 y.o. female.  HPI  31 year old female who states that she was staying at a friend's house last night and noted some burningto her leg. She states that there was an incident in the blankets that have been stored. She did not see the insect. She started having some itching on the lateral aspect of her right thigh. There is been erythematous. There is no or fever. She has not had any shortness of breath, nausea, vomiting, or diarrhea. She has not been lightheaded.  Past Medical History:  Diagnosis Date  . Abnormal Pap smear   . Anemia   . Asthma    as a child, "outgrew it"    Patient Active Problem List   Diagnosis Date Noted  . S/P repeat low transverse C-section 01/29/2015  . IUGR (intrauterine growth restriction) affecting care of mother   . SIRS (systemic inflammatory response syndrome) ([place]) 04/24/2014  . Anemia 09/01/2012    Past Surgical History:  Procedure Laterality Date  . CERVICAL CERCLAGE N/A 10/14/2014   Procedure: CERCLAGE CERVICAL;  Surgeon: Delice Lesch, MD;  Location: [facility];  Service: Gynecology;  Laterality: N/A;  . CESAREAN SECTION     2010  . CESAREAN SECTION N/A 01/29/2015   Procedure: CESAREAN SECTION;  Surgeon: Everett Graff, MD;  Location: [facility];  Service: Obstetrics;  Laterality: N/A;  . FACIAL RECONSTRUCTION SURGERY      OB History    Gravida Para Term Preterm AB Living   3 2 1 1 1 2    SAB TAB Ectopic Multiple Live Births   1     0 2       Home Medications    Prior to Admission medications   Medication Sig Start Date End Date Taking? Authorizing Provider  amoxicillin (AMOXIL) 500 MG capsule Take 1 capsule (500 mg total) by mouth 3 (three) times daily. 11/03/16   Ashley Murrain, NP  Multiple Vitamins-Minerals (MULTIVITAMIN PO) Take  1 tablet by mouth daily.    [provider]  naproxen (NAPROSYN) 500 MG tablet Take 1 tablet (500 mg total) by mouth 2 (two) times daily. 11/03/16   Ashley Murrain, NP  norethindrone (ORTHO MICRONOR) 0.35 MG tablet Take 1 tablet (0.35 mg total) by mouth daily. 02/01/15   Donnel Saxon, CNM  predniSONE (DELTASONE) 10 MG tablet Take 2 tablets (20 mg total) by mouth daily. 05/06/17   Pattricia Boss, MD    Family History Family History  Problem Relation Age of Onset  . Stroke Mother   . Diabetes Mother   . Hypertension Mother   . Coronary artery disease Mother   . Hypertension Father     Social History Social History  Substance Use Topics  . Smoking status: Never Smoker  . Smokeless tobacco: Never Used  . Alcohol use Yes     Comment: occaisionally, not currently with pregnancy     Allergies   Oxycodone hcl; Latex; and Terconazole   Review of Systems Review of Systems  All other systems reviewed and are negative.    Physical Exam Updated Vital Signs BP (!) 156/104   Pulse 88   Temp 98.4 F (36.9 C) (Oral)   Resp 18   SpO2 100%   Physical Exam  Constitutional: She appears  well-developed and well-nourished.  HENT:  Head: Normocephalic and atraumatic.  Eyes: Pupils are equal, round, and reactive to light. EOM are normal.  Neck: Normal range of motion.  Cardiovascular: Normal rate.   Neurological: She is alert.  Skin: Skin is warm.  Multiple excoriated area shows lateral right thigh with some erythema approximately 2 x 2 centimeters.  Psychiatric: She has a normal mood and affect.  Nursing note and vitals reviewed.    ED Treatments / Results  Labs (all labs ordered are listed, but only abnormal results are displayed) Labs Reviewed - No data to display  EKG  EKG Interpretation None       Radiology No results found.  Procedures Procedures (including critical care time)  Medications Ordered in ED Medications - No data to display   Initial  Impression / Assessment and Plan / ED Course  I have reviewed the triage vital signs and the nursing notes.  Pertinent labs & imaging results that were available during my care of the patient were reviewed by me and considered in my medical decision making (see chart for details).       Final Clinical Impressions(s) / ED Diagnoses   Final diagnoses:  Insect bite, initial encounter  Hypertension, unspecified type   Patient advised regarding need for return precautions and follow-up regarding high blood pressure New Prescriptions Discharge Medication List as of 05/06/2017  9:34 PM    START taking these medications   Details  predniSONE (DELTASONE) 10 MG tablet Take 2 tablets (20 mg total) by mouth daily., Starting Sun 05/06/2017, Print         Pattricia Boss, MD 05/06/17 2202

## 2017-05-06 NOTE — ED Notes (Signed)
Patient able to ambulate independently  

## 2017-05-10 ENCOUNTER — Other Ambulatory Visit: Payer: Self-pay | Admitting: Obstetrics and Gynecology

## 2017-05-29 ENCOUNTER — Inpatient Hospital Stay (HOSPITAL_COMMUNITY)
Admission: AD | Admit: 2017-05-29 | Discharge: 2017-05-29 | Disposition: A | Discharge status: Home / Self Care | Payer: Medicaid Other | Source: Ambulatory Visit | Attending: Obstetrics and Gynecology | Admitting: Obstetrics and Gynecology

## 2017-05-29 DIAGNOSIS — N898 Other specified noninflammatory disorders of vagina: Secondary | ICD-10-CM | POA: Diagnosis present

## 2017-05-29 DIAGNOSIS — I10 Essential (primary) hypertension: Secondary | ICD-10-CM | POA: Diagnosis not present

## 2017-05-29 DIAGNOSIS — N920 Excessive and frequent menstruation with regular cycle: Secondary | ICD-10-CM | POA: Diagnosis not present

## 2017-05-29 DIAGNOSIS — Z8249 Family history of ischemic heart disease and other diseases of the circulatory system: Secondary | ICD-10-CM | POA: Diagnosis not present

## 2017-05-29 DIAGNOSIS — Z9889 Other specified postprocedural states: Secondary | ICD-10-CM | POA: Diagnosis not present

## 2017-05-29 DIAGNOSIS — Z91040 Latex allergy status: Secondary | ICD-10-CM | POA: Diagnosis present

## 2017-05-29 DIAGNOSIS — R7689 Other specified abnormal immunological findings in serum: Secondary | ICD-10-CM | POA: Diagnosis not present

## 2017-05-29 DIAGNOSIS — D500 Iron deficiency anemia secondary to blood loss (chronic): Secondary | ICD-10-CM | POA: Diagnosis not present

## 2017-05-29 DIAGNOSIS — Z823 Family history of stroke: Secondary | ICD-10-CM | POA: Insufficient documentation

## 2017-05-29 DIAGNOSIS — Z833 Family history of diabetes mellitus: Secondary | ICD-10-CM | POA: Diagnosis not present

## 2017-05-29 DIAGNOSIS — N926 Irregular menstruation, unspecified: Secondary | ICD-10-CM | POA: Diagnosis not present

## 2017-05-29 DIAGNOSIS — R531 Weakness: Secondary | ICD-10-CM | POA: Diagnosis present

## 2017-05-29 DIAGNOSIS — IMO0002 Reserved for concepts with insufficient information to code with codable children: Secondary | ICD-10-CM | POA: Diagnosis not present

## 2017-05-29 DIAGNOSIS — R768 Other specified abnormal immunological findings in serum: Secondary | ICD-10-CM | POA: Diagnosis not present

## 2017-05-29 LAB — COMPREHENSIVE METABOLIC PANEL
ALT: 16 U/L (ref 14–54)
AST: 20 U/L (ref 15–41)
Albumin: 4.4 g/dL (ref 3.5–5.0)
Alkaline Phosphatase: 62 U/L (ref 38–126)
Anion gap: 9 (ref 5–15)
BILIRUBIN TOTAL: 0.3 mg/dL (ref 0.3–1.2)
BUN: 11 mg/dL (ref 6–20)
CALCIUM: 9.4 mg/dL (ref 8.9–10.3)
CHLORIDE: 104 mmol/L (ref 101–111)
CO2: 25 mmol/L (ref 22–32)
CREATININE: 0.68 mg/dL (ref 0.44–1.00)
Glucose, Bld: 108 mg/dL — ABNORMAL HIGH (ref 65–99)
Potassium: 3.9 mmol/L (ref 3.5–5.1)
Sodium: 138 mmol/L (ref 135–145)
TOTAL PROTEIN: 8.5 g/dL — AB (ref 6.5–8.1)

## 2017-05-29 LAB — URINALYSIS, ROUTINE W REFLEX MICROSCOPIC
Bilirubin Urine: NEGATIVE
Glucose, UA: NEGATIVE mg/dL
HGB URINE DIPSTICK: NEGATIVE
Ketones, ur: NEGATIVE mg/dL
Leukocytes, UA: NEGATIVE
NITRITE: NEGATIVE
Protein, ur: NEGATIVE mg/dL
Specific Gravity, Urine: 1.014 (ref 1.005–1.030)
pH: 5 (ref 5.0–8.0)

## 2017-05-29 LAB — WET PREP, GENITAL
SPERM: NONE SEEN
Trich, Wet Prep: NONE SEEN
YEAST WET PREP: NONE SEEN

## 2017-05-29 LAB — CBC
HEMATOCRIT: 30.3 % — AB (ref 36.0–46.0)
Hemoglobin: 8.7 g/dL — ABNORMAL LOW (ref 12.0–15.0)
MCH: 16.4 pg — ABNORMAL LOW (ref 26.0–34.0)
MCHC: 28.7 g/dL — AB (ref 30.0–36.0)
MCV: 57.1 fL — AB (ref 78.0–100.0)
PLATELETS: 371 10*3/uL (ref 150–400)
RBC: 5.31 MIL/uL — ABNORMAL HIGH (ref 3.87–5.11)
RDW: 20.7 % — AB (ref 11.5–15.5)
WBC: 6.8 10*3/uL (ref 4.0–10.5)

## 2017-05-29 LAB — POCT PREGNANCY, URINE: Preg Test, Ur: NEGATIVE

## 2017-05-29 LAB — TYPE AND SCREEN
ABO/RH(D): O POS
Antibody Screen: NEGATIVE

## 2017-05-29 MED ORDER — SODIUM CHLORIDE 0.9 % IV BOLUS (SEPSIS)
250.0000 mL | Freq: Once | INTRAVENOUS | Status: AC
  Administered 2017-05-29: 250 mL via INTRAVENOUS

## 2017-05-29 MED ORDER — METRONIDAZOLE 500 MG PO TABS: 500.0000 mg | ORAL_TABLET | Freq: Two times a day (BID) | ORAL | 0 refills | Status: AC

## 2017-05-29 MED ORDER — SODIUM CHLORIDE 0.9 % IV SOLN: INTRAVENOUS | Status: DC

## 2017-05-29 NOTE — MAU Note (Signed)
Brenda [name], assume care of patient....report received.

## 2017-05-29 NOTE — MAU Note (Signed)
Pt. With complaint of a "little dizziness". HOB decreased, IV infusion in progress, 125 ml/hr of NS. IV site without s/s of infiltrate.

## 2017-05-29 NOTE — MAU Note (Signed)
Had to go to the dr a couple wks ago, was told blood was low, to start taking iron.  Still feeling faint and weak.  Has also been having a d/c with an odor

## 2017-05-29 NOTE — Discharge Instructions (Signed)
Anemia, Nonspecific Anemia is a condition in which the concentration of red blood cells or hemoglobin in the blood is below normal. Hemoglobin is a substance in red blood cells that carries oxygen to the tissues of the body. Anemia results in not enough oxygen reaching these tissues. What are the causes? Common causes of anemia include:  Excessive bleeding. Bleeding may be internal or external. This includes excessive bleeding from periods (in women) or from the intestine.  Poor nutrition.  Chronic kidney, thyroid, and liver disease.  Bone marrow disorders that decrease red blood cell production.  Cancer and treatments for cancer.  HIV, AIDS, and their treatments.  Spleen problems that increase red blood cell destruction.  Blood disorders.  Excess destruction of red blood cells due to infection, medicines, and autoimmune disorders. What are the signs or symptoms?  Minor weakness.  Dizziness.  Headache.  Palpitations.  Shortness of breath, especially with exercise.  Paleness.  Cold sensitivity.  Indigestion.  Nausea.  Difficulty sleeping.  Difficulty concentrating. Symptoms may occur suddenly or they may develop slowly. How is this diagnosed? Additional blood tests are often needed. These help your health care provider determine the best treatment. Your health care provider will check your stool for blood and look for other causes of blood loss. How is this treated? Treatment varies depending on the cause of the anemia. Treatment can include:  Supplements of iron, vitamin B12, or folic acid.  Hormone medicines.  A blood transfusion. This may be needed if blood loss is severe.  Hospitalization. This may be needed if there is significant continual blood loss.  Dietary changes.  Spleen removal. Follow these instructions at home: Keep all follow-up appointments. It often takes many weeks to correct anemia, and having your health care provider check on your  condition and your response to treatment is very important. Get help right away if:  You develop extreme weakness, shortness of breath, or chest pain.  You become dizzy or have trouble concentrating.  You develop heavy vaginal bleeding.  You develop a rash.  You have bloody or black, tarry stools.  You faint.  You vomit up blood.  You vomit repeatedly.  You have abdominal pain.  You have a fever or persistent symptoms for more than 2-3 days.  You have a fever and your symptoms suddenly get worse.  You are dehydrated. This information is not intended to replace advice given to you by your health care provider. Make sure you discuss any questions you have with your health care provider. Document Released: 10/12/2004 Document Revised: 02/16/2016 Document Reviewed: 02/28/2013 Elsevier Interactive Patient Education  2017 Elsevier Inc.  

## 2017-05-29 NOTE — MAU Provider Note (Signed)
History   31 yo [id number] presented unannounced c/o weakness, faintness, vaginal discharge--patient was on her way to appt at [facility] for pelvic US and f/u with Dr. Mancel Bale for prolonged bleeding with last cycle.  She felt faint while riding the bus and presented to MAU.  She did not experience syncope.  She denies SOB, chest pain, HA, visual sx, or any other sx.  She does note thin vaginal d/c with odor since colpo on 05/10/17 at [place] with Dr. Mancel Bale.  Hgb noted to be 7.4 on 8/23, with normal TSH.  She reports irregular cycles, with some episodes of amenorrhea.  Last cycle in August lasted close to 3 weeks.  Prior to this, she had 3 months of amenorrhea.  Not currently sexually active.  Patient Active Problem List   Diagnosis Date Noted  . Menorrhagia 05/29/2017  . Irregular periods/menstrual cycles 05/29/2017  . Abnormal Pap smear--ASCUS with HRHPV 02/2017 05/29/2017  . 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 History of 2 cesarean sections 01/29/2015  . Anemia 09/01/2012    Chief Complaint  Patient presents with  . Vaginal Discharge  . Dizziness   HPI:  As above  OB History    Gravida Para Term Preterm AB Living   3 2 1 1 1 2    SAB TAB Ectopic Multiple Live Births   1     0 2      Past Medical History:  Diagnosis Date  . Abnormal Pap smear   . Anemia   . Asthma    as a child, "outgrew it"    Past Surgical History:  Procedure Laterality Date  . CERVICAL CERCLAGE N/A 10/14/2014   Procedure: CERCLAGE CERVICAL;  Surgeon: Delice Lesch, MD;  Location: [facility];  Service: Gynecology;  Laterality: N/A;  . CESAREAN SECTION     2010  . CESAREAN SECTION N/A 01/29/2015   Procedure: CESAREAN SECTION;  Surgeon: Everett Graff, MD;  Location: [facility];  Service: Obstetrics;  Laterality: N/A;  . FACIAL RECONSTRUCTION SURGERY      Family History  Problem Relation Age of Onset  . Stroke Mother   . Diabetes Mother   . Hypertension Mother   . Coronary artery disease Mother   . Hypertension  Father     Social History  Substance Use Topics  . Smoking status: Never Smoker  . Smokeless tobacco: Never Used  . Alcohol use Yes     Comment: occaisionally, not currently with pregnancy    Allergies:  Allergies  Allergen Reactions  . Oxycodone Hcl Hives  . Latex Itching  . Terconazole Itching    No prescriptions prior to admission.    ROS:  Weakness, faintness, vaginal discharge Physical Exam   Blood pressure (!) 154/97, pulse 81, temperature 98.6 F (37 C), temperature source Oral, resp. rate 17, weight 135.7 kg (299 lb 4 oz), last menstrual period 05/08/2017, SpO2 100 %, unknown if currently breastfeeding.  Vitals:   05/29/17 1215 05/29/17 1217 05/29/17 1220 05/29/17 1414  BP: (!) 146/81 (!) 157/87 (!) 158/86 (!) 154/97  Pulse: 73 87 84 81  Resp:      Temp:      TempSrc:      SpO2:  100%    Weight:       Orthostatics WNL   Physical Exam  In NAD Chest clear Heart RRR without murmur Abd soft, NT Pelvic--small amount white d/c in vault, cervix closed, NT Ext WNL  Results for orders placed or performed during the hospital encounter of  05/29/17 (from the past 24 hour(s))  Urinalysis, Routine w reflex microscopic     Status: None   Collection Time: 05/29/17 10:38 AM  Result Value Ref Range   Color, Urine YELLOW YELLOW   APPearance CLEAR CLEAR   Specific Gravity, Urine 1.014 1.005 - 1.030   pH 5.0 5.0 - 8.0   Glucose, UA NEGATIVE NEGATIVE mg/dL   Hgb urine dipstick NEGATIVE NEGATIVE   Bilirubin Urine NEGATIVE NEGATIVE   Ketones, ur NEGATIVE NEGATIVE mg/dL   Protein, ur NEGATIVE NEGATIVE mg/dL   Nitrite NEGATIVE NEGATIVE   Leukocytes, UA NEGATIVE NEGATIVE  CBC     Status: Abnormal   Collection Time: 05/29/17 10:40 AM  Result Value Ref Range   WBC 6.8 4.0 - 10.5 K/uL   RBC 5.31 (H) 3.87 - 5.11 MIL/uL   Hemoglobin 8.7 (L) 12.0 - 15.0 g/dL   HCT 30.3 (L) 36.0 - 46.0 %   MCV 57.1 (L) 78.0 - 100.0 fL   MCH 16.4 (L) 26.0 - 34.0 pg   MCHC 28.7 (L) 30.0 -  36.0 g/dL   RDW 20.7 (H) 11.5 - 15.5 %   Platelets 371 150 - 400 K/uL  Comprehensive metabolic panel     Status: Abnormal   Collection Time: 05/29/17 10:40 AM  Result Value Ref Range   Sodium 138 135 - 145 mmol/L   Potassium 3.9 3.5 - 5.1 mmol/L   Chloride 104 101 - 111 mmol/L   CO2 25 22 - 32 mmol/L   Glucose, Bld 108 (H) 65 - 99 mg/dL   BUN 11 6 - 20 mg/dL   Creatinine, Ser 0.68 0.44 - 1.00 mg/dL   Calcium 9.4 8.9 - 10.3 mg/dL   Total Protein 8.5 (H) 6.5 - 8.1 g/dL   Albumin 4.4 3.5 - 5.0 g/dL   AST 20 15 - 41 U/L   ALT 16 14 - 54 U/L   Alkaline Phosphatase 62 38 - 126 U/L   Total Bilirubin 0.3 0.3 - 1.2 mg/dL   GFR calc non Af Amer >60 >60 mL/min   GFR calc Af Amer >60 >60 mL/min   Anion gap 9 5 - 15  Type and screen [place]     Status: None   Collection Time: 05/29/17 10:41 AM  Result Value Ref Range   ABO/RH(D) O POS    Antibody Screen NEG    Sample Expiration 06/01/2017   Pregnancy, urine POC     Status: None   Collection Time: 05/29/17 11:52 AM  Result Value Ref Range   Preg Test, Ur NEGATIVE NEGATIVE  Wet prep, genital     Status: Abnormal   Collection Time: 05/29/17  1:20 PM  Result Value Ref Range   Yeast Wet Prep HPF POC NONE SEEN NONE SEEN   Trich, Wet Prep NONE SEEN NONE SEEN   Clue Cells Wet Prep HPF POC PRESENT (A) NONE SEEN   WBC, Wet Prep HPF POC MANY (A) NONE SEEN   Sperm NONE SEEN    Feeling better after IV hydration with NS.  ED Course  Assessment: Anemia due to menorrhagia--no bleeding at present BV Mild HTN   Plan: Consulted with Dr. Carole Civil blood transfusion today, due to stable Hgb and orthostatics. Patient to f/u with Dr. Mancel Bale 06/14/17 for pelvic US and visit to follow with Dr. Mancel Bale. Patient to increase Fe supplement to BID, Fe rich foods. Rx Metronidazole 500 mg po BID x 7 days--declines STD testing.   Donnel Saxon CNM, MSN 05/29/2017  3:52 PM

## 2017-06-07 ENCOUNTER — Emergency Department (HOSPITAL_COMMUNITY)
Admission: EM | Admit: 2017-06-07 | Discharge: 2017-06-07 | Disposition: A | Discharge status: Home / Self Care | Payer: Medicaid Other | Attending: Emergency Medicine | Admitting: Emergency Medicine

## 2017-06-07 ENCOUNTER — Encounter (HOSPITAL_COMMUNITY): Payer: Self-pay | Admitting: Emergency Medicine

## 2017-06-07 ENCOUNTER — Emergency Department (HOSPITAL_COMMUNITY): Payer: Medicaid Other

## 2017-06-07 DIAGNOSIS — Y998 Other external cause status: Secondary | ICD-10-CM | POA: Diagnosis not present

## 2017-06-07 DIAGNOSIS — W292XXA Contact with other powered household machinery, initial encounter: Secondary | ICD-10-CM | POA: Diagnosis not present

## 2017-06-07 DIAGNOSIS — J45909 Unspecified asthma, uncomplicated: Secondary | ICD-10-CM | POA: Insufficient documentation

## 2017-06-07 DIAGNOSIS — Z79899 Other long term (current) drug therapy: Secondary | ICD-10-CM | POA: Diagnosis not present

## 2017-06-07 DIAGNOSIS — Y9389 Activity, other specified: Secondary | ICD-10-CM | POA: Insufficient documentation

## 2017-06-07 DIAGNOSIS — Y929 Unspecified place or not applicable: Secondary | ICD-10-CM | POA: Diagnosis not present

## 2017-06-07 DIAGNOSIS — Z1810 Retained metal fragments, unspecified: Secondary | ICD-10-CM | POA: Diagnosis not present

## 2017-06-07 DIAGNOSIS — S6992XA Unspecified injury of left wrist, hand and finger(s), initial encounter: Secondary | ICD-10-CM | POA: Diagnosis present

## 2017-06-07 DIAGNOSIS — S61341A Puncture wound with foreign body of left index finger with damage to nail, initial encounter: Secondary | ICD-10-CM | POA: Diagnosis not present

## 2017-06-07 DIAGNOSIS — Z91040 Latex allergy status: Secondary | ICD-10-CM | POA: Diagnosis not present

## 2017-06-07 DIAGNOSIS — M795 Residual foreign body in soft tissue: Secondary | ICD-10-CM

## 2017-06-07 MED ORDER — CEPHALEXIN 500 MG PO CAPS: ORAL_CAPSULE | ORAL | 0 refills | Status: DC

## 2017-06-07 NOTE — ED Notes (Signed)
Patient transported to X-ray 

## 2017-06-07 NOTE — ED Notes (Addendum)
Waiting for discharge instructions.

## 2017-06-07 NOTE — ED Notes (Signed)
Pt was sewing with a machine, needle went through left index finger, LAST NIGHT.

## 2017-06-07 NOTE — ED Triage Notes (Signed)
Pt was using a electric sowing machine and accidentally had the needle go in left index finger.

## 2017-06-07 NOTE — ED Provider Notes (Signed)
[facility] DEPT Provider Note   CSN: [id number] Arrival date & time: 06/07/17  1256     History   Chief Complaint Chief Complaint  Patient presents with  . Finger Injury    HPI Brenda [name] is a 31 y.o. female who presents to the emergency department with chief complaint of left index finger injury. Patient states that she was using her sewing machine when she accidentally hit her left index finger with the needle. The needle was stuck in her finger and she was able to pull the majority of the needle out however she feels that some broke off. In the morning she did take out some retained no e convidence of really small little 2 mm he is just like sitting right on top of the distal phalanx. Last TDAP 01/30/15  HPI  Past Medical History:  Diagnosis Date  . Abnormal Pap smear   . Anemia   . Asthma    as a child, "outgrew it"    Patient Active Problem List   Diagnosis Date Noted  . Menorrhagia 05/29/2017  . Irregular periods/menstrual cycles 05/29/2017  . Abnormal Pap smear--ASCUS with HRHPV 02/2017 05/29/2017  . 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 Latex allergy 05/29/2017  . History of 2 cesarean sections 01/29/2015  . Anemia 09/01/2012    Past Surgical History:  Procedure Laterality Date  . CERVICAL CERCLAGE N/A 10/14/2014   Procedure: CERCLAGE CERVICAL;  Surgeon: Delice Lesch, MD;  Location: [facility];  Service: Gynecology;  Laterality: N/A;  . CESAREAN SECTION     2010  . CESAREAN SECTION N/A 01/29/2015   Procedure: CESAREAN SECTION;  Surgeon: Everett Graff, MD;  Location: [facility];  Service: Obstetrics;  Laterality: N/A;  . FACIAL RECONSTRUCTION SURGERY      OB History    Gravida Para Term Preterm AB Living   3 2 1 1 1 2    SAB TAB Ectopic Multiple Live Births   1     0 2       Home Medications    Prior to Admission medications   Medication Sig Start Date End Date Taking? Authorizing Provider  Multiple Vitamins-Minerals (MULTIVITAMIN PO) Take 1 tablet by  mouth daily.    [provider]    Family History Family History  Problem Relation Age of Onset  . Stroke Mother   . Diabetes Mother   . Hypertension Mother   . Coronary artery disease Mother   . Hypertension Father     Social History Social History  Substance Use Topics  . Smoking status: Never Smoker  . Smokeless tobacco: Never Used  . Alcohol use Yes     Comment: occaisionally, not currently with pregnancy     Allergies   Oxycodone hcl; Latex; and Terconazole   Review of Systems Review of Systems  Musculoskeletal: Positive for joint swelling.  Skin: Positive for wound.  Neurological: Positive for light-headedness. Negative for weakness.        Physical Exam Updated Vital Signs BP 137/77 (BP Location: Left Arm)   Pulse 86   Temp 98.1 F (36.7 C) (Oral)   Resp 19   LMP 05/08/2017   SpO2 100%   Physical Exam  Constitutional: She is oriented to person, place, and time. She appears well-developed and well-nourished. No distress.  HENT:  Head: Normocephalic and atraumatic.  Eyes: Conjunctivae are normal. No scleral icterus.  Neck: Normal range of motion.  Cardiovascular: Normal rate, regular rhythm and normal heart sounds.  Exam reveals no gallop  and no friction rub.   No murmur heard. Pulmonary/Chest: Effort normal and breath sounds normal. No respiratory distress.  Abdominal: Soft. Bowel sounds are normal. She exhibits no distension and no mass. There is no tenderness. There is no guarding.  Musculoskeletal:  Left index finger with puncture wound through the nailbed and through the tip of the finger. Minimal bruising, minimal tenderness to palpation, no evidence of infection  Neurological: She is alert and oriented to person, place, and time.  Skin: Skin is warm and dry. She is not diaphoretic.  Psychiatric: Her behavior is normal.  Nursing note and vitals reviewed.    ED Treatments / Results  Labs (all labs ordered are listed, but only  abnormal results are displayed) Labs Reviewed - No data to display  EKG  EKG Interpretation None       Radiology No results found.  Procedures Procedures (including critical care time)  Medications Ordered in ED Medications - No data to display   Initial Impression / Assessment and Plan / ED Course  I have reviewed the triage vital signs and the nursing notes.  Pertinent labs & imaging results that were available during my care of the patient were reviewed by me and considered in my medical decision making (see chart for details).     Patient with retained foreign body in the distal fingertip, just proximal to the distal phalanx. It is 3 mm deep and right behind the nail. I spoke with Dr. Fredna Dow who is on-call and he will see her in the office for foreign body removal tomorrow morning. The patient does remain nothing by mouth after midnight. I have started her on Keflex. She is up-to-date on her tetanus vaccination without signs of infection.   Final Clinical Impressions(s) / ED Diagnoses   Final diagnoses:  Retained foreign body in soft tissue    New Prescriptions New Prescriptions   No medications on file     Ned Grace 06/07/17 [place], MD 06/07/17 1731

## 2017-07-24 ENCOUNTER — Other Ambulatory Visit: Payer: Self-pay | Admitting: Obstetrics and Gynecology

## 2018-02-14 ENCOUNTER — Encounter (HOSPITAL_COMMUNITY): Payer: Self-pay | Admitting: *Deleted

## 2018-02-14 ENCOUNTER — Other Ambulatory Visit: Payer: Self-pay

## 2018-02-14 ENCOUNTER — Observation Stay (HOSPITAL_COMMUNITY)
Admission: AD | Admit: 2018-02-14 | Discharge: 2018-02-15 | Disposition: A | Discharge status: Home / Self Care | Payer: Medicaid Other | Source: Ambulatory Visit | Attending: Obstetrics and Gynecology | Admitting: Obstetrics and Gynecology

## 2018-02-14 DIAGNOSIS — J45909 Unspecified asthma, uncomplicated: Secondary | ICD-10-CM | POA: Insufficient documentation

## 2018-02-14 DIAGNOSIS — D500 Iron deficiency anemia secondary to blood loss (chronic): Secondary | ICD-10-CM

## 2018-02-14 DIAGNOSIS — Z3A00 Weeks of gestation of pregnancy not specified: Secondary | ICD-10-CM | POA: Diagnosis not present

## 2018-02-14 DIAGNOSIS — Z91040 Latex allergy status: Secondary | ICD-10-CM | POA: Insufficient documentation

## 2018-02-14 DIAGNOSIS — O99019 Anemia complicating pregnancy, unspecified trimester: Principal | ICD-10-CM | POA: Insufficient documentation

## 2018-02-14 DIAGNOSIS — D649 Anemia, unspecified: Secondary | ICD-10-CM | POA: Diagnosis present

## 2018-02-14 LAB — PREPARE RBC (CROSSMATCH)

## 2018-02-14 MED ORDER — ACETAMINOPHEN 325 MG PO TABS: 650.0000 mg | ORAL_TABLET | ORAL | Status: DC | PRN

## 2018-02-14 MED ORDER — SODIUM CHLORIDE 0.9 % IV SOLN: Freq: Once | INTRAVENOUS | Status: DC

## 2018-02-14 NOTE — H&P (Signed)
Brenda [name] is an 32 y.o. female, presenting for a blood transfusion.  Pt was seen in office for menometrorrhagia.  Was noted to have Wallace of 6.8/25.1.  Pt was instructed to come to hospital for treatment.  Patient Active Problem List   Diagnosis Date Noted  . Menorrhagia 05/29/2017  . Irregular periods/menstrual cycles 05/29/2017  . Abnormal Pap smear--ASCUS with HRHPV 02/2017 05/29/2017  . 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 Latex allergy 05/29/2017  . History of 2 cesarean sections 01/29/2015  . Anemia 09/01/2012    Pertinent Gynecological History: Menses: flow is excessive and long,  Currently no bleeding. LMP 01/16/2018 Bleeding: dysfunctional uterine bleeding Contraception: oral progesterone-only contraceptive Sexually transmitted diseases: HSV and HPV OB History: G2, P1101  MEDICAL/FAMILY/SOCIAL HX: No LMP recorded.    Past Medical History:  Diagnosis Date  . Abnormal Pap smear   . Anemia   . Asthma    as a child, "outgrew it"    Past Surgical History:  Procedure Laterality Date  . CERVICAL CERCLAGE N/A 10/14/2014   Procedure: CERCLAGE CERVICAL;  Surgeon: Delice Lesch, MD;  Location: [facility];  Service: Gynecology;  Laterality: N/A;  . CESAREAN SECTION     2010  . CESAREAN SECTION N/A 01/29/2015   Procedure: CESAREAN SECTION;  Surgeon: Everett Graff, MD;  Location: [facility];  Service: Obstetrics;  Laterality: N/A;  . FACIAL RECONSTRUCTION SURGERY      Family History  Problem Relation Age of Onset  . Stroke Mother   . Diabetes Mother   . Hypertension Mother   . Coronary artery disease Mother   . Hypertension Father     Social History:  reports that she has never smoked. She has never used smokeless tobacco. She reports that she drinks alcohol. She reports that she does not use drugs.  ALLERGIES/MEDS:  Allergies:  Allergies  Allergen Reactions  . Oxycodone Hcl Hives  . Latex Itching  . Terconazole Itching    Medications Prior to Admission  Medication  Sig Dispense Refill Last Dose  . cephALEXin (KEFLEX) 500 MG capsule 2 caps po bid x 7 days 28 capsule 0   . Multiple Vitamins-Minerals (MULTIVITAMIN PO) Take 1 tablet by mouth daily.   05/29/2017 at Unknown time     Constitutional: Constitutional: no fever or significant weight loss/gain. Has fatigue  Cardiovascular: Cardiovascular: no orthopnea or edema and palpitations, SOB, and chest pain.  Gastrointestinal: Gastrointestinal: no heartburn, indigestion, nausea, vomiting, difficulty swallowing, abdominal pain, or bowel movement changes.  Genitourinary: Genitourinary: no vaginal odor or itching; no hematuria, incontinence, rash, lesion, discharge, flank pain, or trouble urinating; and abnormal bleeding.  Endocrine: Endocrine: no endocrine symptoms and increased hunger. Menstrual cycle: tension/anxiety and mood swings. Menopausal: no menopausal symptoms. Sexual problems: no sexual problems.  Psychological: Psychiatric: no depression or alcoholism.  Physical Exam  Constitutional: She is oriented to person, place, and time. She appears well-developed and well-nourished.  HENT:  Head: Normocephalic.  Cardiovascular: Normal rate and regular rhythm.  Respiratory: Effort normal.  GI: Soft.  Musculoskeletal: Normal range of motion.  Neurological: She is alert and oriented to person, place, and time.  Skin: Skin is warm and dry.    ASSESSMENT: Menometrorrhagia Anemia   PLAN: Blood transfusion   [place] CNM 02/14/2018, 10:12 PM

## 2018-02-14 NOTE — Plan of Care (Signed)
  Problem: Education: Goal: Knowledge of General Education information will improve Outcome: Completed/Met

## 2018-02-15 DIAGNOSIS — O99019 Anemia complicating pregnancy, unspecified trimester: Secondary | ICD-10-CM | POA: Diagnosis not present

## 2018-02-15 LAB — HEMOGLOBIN AND HEMATOCRIT, BLOOD
HCT: 30 % — ABNORMAL LOW (ref 36.0–46.0)
HEMOGLOBIN: 8.7 g/dL — AB (ref 12.0–15.0)

## 2018-02-15 MED ORDER — NORETHINDRONE ACETATE 5 MG PO TABS: ORAL_TABLET | ORAL | Status: DC

## 2018-02-15 NOTE — Progress Notes (Signed)
All discharge teaching completed with the patient. All printed discharge instructions given and explained to the patient. Patient verbalizes an understanding of all instructions given. Denies any questions or concerns at this time.

## 2018-02-15 NOTE — Discharge Instructions (Signed)
Menorrhagia Menorrhagia is when your menstrual periods are heavy or last longer than usual. Follow these instructions at home:  Only take medicine as told by your doctor.  Take any iron pills as told by your doctor. Heavy bleeding may cause low levels of iron in your body.  Do not take aspirin 1 week before or during your period. Aspirin can make the bleeding worse.  Lie down for a while if you change your tampon or pad more than once in 2 hours. This may help lessen the bleeding.  Eat a healthy diet and foods with iron. These foods include leafy green vegetables, meat, liver, eggs, and whole grain breads and cereals.  Do not try to lose weight. Wait until the heavy bleeding has stopped and your iron level is normal. Contact a doctor if:  You soak through a pad or tampon every 1 or 2 hours, and this happens every time you have a period.  You need to use pads and tampons at the same time because you are bleeding so much.  You need to change your pad or tampon during the night.  You have a period that lasts for more than 8 days.  You pass clots bigger than 1 inch (2.5 cm) wide.  You have irregular periods that happen more or less often than once a month.  You feel dizzy or pass out (faint).  You feel very weak or tired.  You feel short of breath or feel your heart is beating too fast when you exercise.  You feel sick to your stomach (nausea) and you throw up (vomit) while you are taking your medicine.  You have watery poop (diarrhea) while you are taking your medicine.  You have any problems that may be related to the medicine you are taking. Get help right away if:  You soak through 4 or more pads or tampons in 2 hours.  You have any bleeding while you are pregnant. This information is not intended to replace advice given to you by your health care provider. Make sure you discuss any questions you have with your health care provider. Document Released: 06/13/2008 Document  Revised: 02/10/2016 Document Reviewed: 03/06/2013 Elsevier Interactive Patient Education  2017 Elsevier Inc.  

## 2018-02-15 NOTE — Discharge Summary (Signed)
Physician Discharge Summary  Patient ID: Brenda [name] MRN: [id number] DOB/AGE: 03/07/1986 32 y.o.  Admit date: 02/14/2018 Discharge date: 02/15/2018  Admission Diagnoses:  Discharge Diagnoses:  Active Problems:   Anemia   Discharged Condition: stable  Hospital Course: Admitted 02/14/18 s/p office visit with Dr. Mancel Bale with dx of severe anemia due to menorrhagia, with Hgb 6.8, and symptomatic of anemia.  Received 2 units PRBC, tolerated well.  Repeat Hgb done prior to d/c is pending at the time of d/c.  She is not currently bleeding, and is orthostatically and clinically stable.  She is to start Northindrone 5 mg po, 2 tabs po daily, and has instructions for additional doses if bleeding increases.  She is to f/u with Dr. Mancel Bale in 2 weeks in the office, or prn.  Consults: None  Significant Diagnostic Studies:  Post-transfusion CBC pending.  Treatments: IV hydration and transfusion of 2 u PRBC.  Discharge Exam: Blood pressure 138/82, pulse 68, temperature 98.5 F (36.9 C), temperature source Oral, resp. rate 17, height 5\' 9"  (1.753 m), weight 132.9 kg (293 lb), SpO2 97 %, unknown if currently breastfeeding. General appearance: alert Resp: clear to auscultation bilaterally Cardio: regular rate and rhythm, S1, S2 normal, no murmur, click, rub or gallop Pelvic: No bleeding Extremities: extremities normal, atraumatic, no cyanosis or edema Skin: Skin color, texture, turgor normal. No rashes or lesions  Disposition: Discharge disposition: 01-Home or Self Care       Discharge Instructions    Discharge patient   Complete by:  As directed    Discharge disposition:  01-Home or Self Care   Discharge patient date:  02/15/2018     Allergies as of 02/15/2018      Reactions   Oxycodone Hcl Hives   Latex Itching   Terconazole Itching      Medication List    STOP taking these medications   cephALEXin 500 MG capsule Commonly known as:  KEFLEX   MULTIVITAMIN PO       Follow-up Information    Everett Graff, MD. Schedule an appointment as soon as possible for a visit in 2 week(s).   Specialty:  Obstetrics and Gynecology Why:  Call for any questions or issues. Contact information: [address] [place] [address] [phone]           Signed: Donnel Saxon 02/15/2018, 7:48 AM

## 2018-02-16 LAB — TYPE AND SCREEN
ABO/RH(D): O POS
ANTIBODY SCREEN: NEGATIVE
UNIT DIVISION: 0
Unit division: 0

## 2018-02-16 LAB — BPAM RBC
BLOOD PRODUCT EXPIRATION DATE: 201906202359
Blood Product Expiration Date: 201906202359
ISSUE DATE / TIME: 201905310032
ISSUE DATE / TIME: 201905310358
UNIT TYPE AND RH: 5100
Unit Type and Rh: 5100

## 2018-07-05 ENCOUNTER — Other Ambulatory Visit: Payer: Self-pay

## 2018-07-05 ENCOUNTER — Emergency Department (HOSPITAL_COMMUNITY)
Admission: EM | Admit: 2018-07-05 | Discharge: 2018-07-05 | Disposition: A | Discharge status: Home / Self Care | Payer: Medicaid Other | Attending: Emergency Medicine | Admitting: Emergency Medicine

## 2018-07-05 ENCOUNTER — Emergency Department (HOSPITAL_COMMUNITY): Payer: Medicaid Other

## 2018-07-05 ENCOUNTER — Encounter (HOSPITAL_COMMUNITY): Payer: Self-pay | Admitting: *Deleted

## 2018-07-05 DIAGNOSIS — R079 Chest pain, unspecified: Secondary | ICD-10-CM | POA: Diagnosis not present

## 2018-07-05 DIAGNOSIS — Z5321 Procedure and treatment not carried out due to patient leaving prior to being seen by health care provider: Secondary | ICD-10-CM | POA: Insufficient documentation

## 2018-07-05 LAB — CBC
HEMATOCRIT: 37.1 % (ref 36.0–46.0)
HEMOGLOBIN: 10.5 g/dL — AB (ref 12.0–15.0)
MCH: 18.4 pg — AB (ref 26.0–34.0)
MCHC: 28.3 g/dL — AB (ref 30.0–36.0)
MCV: 65.1 fL — AB (ref 80.0–100.0)
Platelets: 253 10*3/uL (ref 150–400)
RBC: 5.7 MIL/uL — ABNORMAL HIGH (ref 3.87–5.11)
RDW: 20.2 % — AB (ref 11.5–15.5)
WBC: 6.8 10*3/uL (ref 4.0–10.5)
nRBC: 0 % (ref 0.0–0.2)

## 2018-07-05 LAB — BASIC METABOLIC PANEL
Anion gap: 11 (ref 5–15)
BUN: 7 mg/dL (ref 6–20)
CALCIUM: 9.3 mg/dL (ref 8.9–10.3)
CHLORIDE: 103 mmol/L (ref 98–111)
CO2: 24 mmol/L (ref 22–32)
CREATININE: 0.73 mg/dL (ref 0.44–1.00)
GFR calc non Af Amer: >60 mL/min (ref >60)
Glucose, Bld: 82 mg/dL (ref 70–99)
Potassium: 3.6 mmol/L (ref 3.5–5.1)
SODIUM: 138 mmol/L (ref 135–145)

## 2018-07-05 LAB — I-STAT BETA HCG BLOOD, ED (MC, WL, AP ONLY): I-stat hCG, quantitative: <5 m[IU]/mL (ref <5)

## 2018-07-05 LAB — TROPONIN I

## 2018-07-05 NOTE — ED Notes (Signed)
The pt had blood drawn 2w days ago on her doctors office  No results called to her yet

## 2018-07-05 NOTE — ED Notes (Signed)
Called pt 3 x in lobby, no answer.  

## 2018-07-05 NOTE — ED Triage Notes (Signed)
The pt rfeports that she usually feels like this when her hgb is low

## 2018-07-05 NOTE — ED Triage Notes (Signed)
The pt is c/o chest pain since yesterday with dizziness  lmp now

## 2018-07-05 NOTE — ED Notes (Signed)
Results reviewed, no changes in acuity at this time 

## 2018-07-05 NOTE — ED Notes (Signed)
Patient did not answer 3x called by nurse

## 2018-11-28 ENCOUNTER — Ambulatory Visit (HOSPITAL_COMMUNITY)
Admission: RE | Admit: 2018-11-28 | Discharge: 2018-11-28 | Disposition: A | Discharge status: Home / Self Care | Payer: Medicaid Other | Source: Ambulatory Visit | Attending: Emergency Medicine | Admitting: Emergency Medicine

## 2018-11-28 ENCOUNTER — Emergency Department (HOSPITAL_COMMUNITY)
Admission: EM | Admit: 2018-11-28 | Discharge: 2018-11-28 | Disposition: A | Discharge status: Home / Self Care | Payer: Medicaid Other | Attending: Emergency Medicine | Admitting: Emergency Medicine

## 2018-11-28 ENCOUNTER — Other Ambulatory Visit (HOSPITAL_COMMUNITY): Payer: Self-pay | Admitting: Emergency Medicine

## 2018-11-28 DIAGNOSIS — J209 Acute bronchitis, unspecified: Secondary | ICD-10-CM | POA: Diagnosis not present

## 2018-11-28 DIAGNOSIS — Z91040 Latex allergy status: Secondary | ICD-10-CM | POA: Diagnosis not present

## 2018-11-28 DIAGNOSIS — J40 Bronchitis, not specified as acute or chronic: Secondary | ICD-10-CM

## 2018-11-28 DIAGNOSIS — J45909 Unspecified asthma, uncomplicated: Secondary | ICD-10-CM | POA: Diagnosis not present

## 2018-11-28 DIAGNOSIS — R059 Cough, unspecified: Secondary | ICD-10-CM

## 2018-11-28 DIAGNOSIS — R05 Cough: Secondary | ICD-10-CM | POA: Diagnosis not present

## 2018-11-28 DIAGNOSIS — Z79899 Other long term (current) drug therapy: Secondary | ICD-10-CM | POA: Diagnosis not present

## 2018-11-28 DIAGNOSIS — J069 Acute upper respiratory infection, unspecified: Secondary | ICD-10-CM | POA: Diagnosis not present

## 2018-11-28 DIAGNOSIS — R0981 Nasal congestion: Secondary | ICD-10-CM | POA: Diagnosis present

## 2018-11-28 MED ORDER — ALBUTEROL SULFATE HFA 108 (90 BASE) MCG/ACT IN AERS: 1.0000 | INHALATION_SPRAY | Freq: Four times a day (QID) | RESPIRATORY_TRACT | 0 refills | Status: AC | PRN

## 2018-11-28 MED ORDER — BENZONATATE 200 MG PO CAPS: 200.0000 mg | ORAL_CAPSULE | Freq: Three times a day (TID) | ORAL | 0 refills | Status: AC

## 2018-11-28 NOTE — ED Notes (Signed)
Patient transported to X-ray 

## 2018-11-28 NOTE — ED Provider Notes (Signed)
[facility] EMERGENCY DEPARTMENT Provider Note   CSN: [id number] Arrival date & time: 11/28/18  0848    History   Chief Complaint Chief Complaint  Patient presents with  . Cold Symptoms    HPI Brenda Brenda [name] is a 33 y.o. Brenda [name].     33 year old Brenda [name] presents with complaint of cough x2 weeks as well as nasal congestion, runny nose and wheezing.  Patient states she has a history of asthma however has not needed to use an inhaler for the past 16 years.  Patient is taking Mucinex with limited relief.  Patient states her cough is productive with yellow sputum.  Patient denies fevers, chills, sick contacts, recent travel or contact with those who have recently traveled.  Patient is a non-smoker, does not vape.  No other complaints or concerns.     Past Medical History:  Diagnosis Date  . Abnormal Pap smear   . Anemia   . Asthma    as a child, "outgrew it"    Patient Active Problem List   Diagnosis Date Noted  . Menorrhagia 05/29/2017  . Irregular periods/menstrual cycles 05/29/2017  . Abnormal Pap smear--ASCUS with HRHPV 02/2017 05/29/2017  . 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 Latex allergy 05/29/2017  . History of 2 cesarean sections 01/29/2015  . Anemia 09/01/2012    Past Surgical History:  Procedure Laterality Date  . CERVICAL CERCLAGE N/A 10/14/2014   Procedure: CERCLAGE CERVICAL;  Surgeon: Delice Lesch, MD;  Location: [facility];  Service: Gynecology;  Laterality: N/A;  . CESAREAN SECTION     2010  . CESAREAN SECTION N/A 01/29/2015   Procedure: CESAREAN SECTION;  Surgeon: Everett Graff, MD;  Location: [facility];  Service: Obstetrics;  Laterality: N/A;  . FACIAL RECONSTRUCTION SURGERY       OB History    Gravida  3   Para  2   Term  1   Preterm  1   AB  1   Living  2     SAB  1   TAB      Ectopic      Multiple  0   Live Births  2            Home Medications    Prior to Admission medications   Medication Sig Start Date  End Date Taking? Authorizing Provider  albuterol (PROVENTIL HFA;VENTOLIN HFA) 108 (90 Base) MCG/ACT inhaler Inhale 1-2 puffs into the lungs every 6 (six) hours as needed for wheezing or shortness of breath. 11/28/18   Tacy Learn, PA-C  benzonatate (TESSALON) 200 MG capsule Take 1 capsule (200 mg total) by mouth every 8 (eight) hours for 10 days. 11/28/18 12/08/18  Tacy Learn, PA-C  norethindrone (AYGESTIN) 5 MG tablet Take 2 tablets daily. 02/15/18   Donnel Saxon, CNM    Family History Family History  Problem Relation Age of Onset  . Stroke Mother   . Diabetes Mother   . Hypertension Mother   . Coronary artery disease Mother   . Hypertension Father     Social History Social History   Tobacco Use  . Smoking status: Never Smoker  . Smokeless tobacco: Never Used  Substance Use Topics  . Alcohol use: Yes    Comment: occasionally  . Drug use: No     Allergies   Oxycodone hcl; Latex; and Terconazole   Review of Systems Review of Systems  Constitutional: Negative for chills, diaphoresis and fever.  HENT: Positive for congestion, postnasal drip  and rhinorrhea. Negative for ear pain, sinus pressure, sinus pain, sneezing, sore throat, trouble swallowing and voice change.   Eyes: Negative for discharge and redness.  Respiratory: Positive for cough and wheezing. Negative for shortness of breath.   Musculoskeletal: Negative for arthralgias and myalgias.  Skin: Negative for rash and wound.  Allergic/Immunologic: Negative for immunocompromised state.  Neurological: Negative for weakness and headaches.  Hematological: Negative for adenopathy.  All other systems reviewed and are negative.    Physical Exam Updated Vital Signs There were no vitals taken for this visit.  Physical Exam Vitals signs and nursing note reviewed.  Constitutional:      General: She is not in acute distress.    Appearance: She is well-developed. She is not diaphoretic.  HENT:     Head:  Normocephalic and atraumatic.     Right Ear: Tympanic membrane and ear canal normal.     Left Ear: Tympanic membrane and ear canal normal.     Nose: Congestion present.     Mouth/Throat:     Mouth: Mucous membranes are moist.     Pharynx: No oropharyngeal exudate or posterior oropharyngeal erythema.  Eyes:     Conjunctiva/sclera: Conjunctivae normal.  Neck:     Musculoskeletal: Neck supple. No muscular tenderness.  Cardiovascular:     Rate and Rhythm: Normal rate and regular rhythm.     Pulses: Normal pulses.     Heart sounds: Normal heart sounds.  Pulmonary:     Effort: Pulmonary effort is normal.     Breath sounds: Normal breath sounds.  Lymphadenopathy:     Cervical: No cervical adenopathy.  Skin:    General: Skin is warm and dry.     Findings: No erythema or rash.  Neurological:     Mental Status: She is alert and oriented to person, place, and time.  Psychiatric:        Behavior: Behavior normal.      ED Treatments / Results  Labs (all labs ordered are listed, but only abnormal results are displayed) Labs Reviewed - No data to display  EKG None  Radiology Dg Chest 2 View  Result Date: 11/28/2018 CLINICAL DATA:  Cough for 2 weeks. Rattling in the chest. LMP 11/08/2018. EXAM: CHEST - 2 VIEW COMPARISON:  07/05/2018 FINDINGS: The cardiopericardial silhouette is moderately enlarged. No focal consolidations or pleural effusions. No pulmonary edema. Insert osseous IMPRESSION: 1. Cardiomegaly versus pericardial effusion. 2. No pulmonary edema or focal consolidation. Electronically Signed   By: Nolon Nations M.D.   On: 11/28/2018 10:45    Procedures Procedures (including critical care time)  Medications Ordered in ED Medications - No data to display   Initial Impression / Assessment and Plan / ED Course  I have reviewed the triage vital signs and the nursing notes.  Pertinent labs & imaging results that were available during my care of the patient were reviewed  by me and considered in my medical decision making (see chart for details).  Clinical Course as of Nov 28 1446  Thu Nov 28, 2018  1035 Patient was seen during downtime, please see downtime triage notes.   [LM]  7924 33 year old Brenda [name] presents with complaint of sinus congestion and cough for 2 weeks with a runny nose.  Patient is taking Mucinex with limited relief.  Patient reports cough is productive with yellow sputum.  Patient denies fevers, chills, sweats, shortness of breath or chest pain.  On exam patient has boggy nasal membranes, lung sounds are clear, heart sounds  are normal.  Chest x-ray ordered for further evaluation due to productive cough x2 weeks. See downtime forms for triage notes, nursing assessment and vital signs.  Vital signs are unremarkable.  Downtime has affected x-ray results.  Patient was discharged with prescription for medication for her cough and an inhaler for report of wheezing.  Chest x-ray later results with cardiomegaly versus pericardial effusion. Discussed with Dr. Sedonia Small, ER attending, contacted patient, verified identity. Patient is feeling well, no SHOB or CP. Advised to return to ER for [facility] or CP, other wise take Mortin TID x 7 days and recheck with PCP.  Patient is able to repeat back plan of care and agrees to follow-up with PCP.  Will return to ER as needed.   [LM]    Clinical Course User Index [LM] Tacy Learn, PA-C   Final Clinical Impressions(s) / ED Diagnoses   Final diagnoses:  Viral upper respiratory tract infection  Bronchitis    ED Discharge Orders         Ordered    albuterol (PROVENTIL HFA;VENTOLIN HFA) 108 (90 Base) MCG/ACT inhaler  Every 6 hours PRN     11/28/18 1203    benzonatate (TESSALON) 200 MG capsule  Every 8 hours     11/28/18 1203           Tacy Learn, PA-C 11/28/18 1448    Maudie Flakes, MD 11/30/18 [phone]

## 2018-11-28 NOTE — ED Triage Notes (Signed)
Pt in with cold symptoms and cough x 2 wks. Denies fevers, states Mucinex not helping

## 2018-11-28 NOTE — ED Notes (Signed)
Patient verbalizes understanding of discharge instructions. Opportunity for questioning and answers were provided. Armband removed by staff, pt discharged from ED. Ambulated out to lobby  

## 2018-11-28 NOTE — Discharge Instructions (Addendum)
Saline sinus rinse twice daily. Take Zyrtec and Flonase daily. Take Tessalon as needed as prescribed for cough. Use inhaler as needed as directed for cough and wheezing.  Thank you for your patience today I am sorry that we have been unable to get a reading completed on your chest x-ray.  I will call you with your chest x-ray report as soon as it is available.  If your report changes are planned today we can send additional medication to your pharmacy.

## 2018-12-06 ENCOUNTER — Other Ambulatory Visit: Payer: Self-pay

## 2018-12-06 ENCOUNTER — Emergency Department (HOSPITAL_COMMUNITY): Payer: Medicaid Other

## 2018-12-06 ENCOUNTER — Observation Stay (HOSPITAL_COMMUNITY)
Admission: EM | Admit: 2018-12-06 | Discharge: 2018-12-07 | Disposition: A | Discharge status: Home / Self Care | Payer: Medicaid Other | Attending: Internal Medicine | Admitting: Internal Medicine

## 2018-12-06 ENCOUNTER — Encounter (HOSPITAL_COMMUNITY): Payer: Self-pay | Admitting: Emergency Medicine

## 2018-12-06 ENCOUNTER — Observation Stay (HOSPITAL_COMMUNITY): Payer: Medicaid Other

## 2018-12-06 ENCOUNTER — Encounter (HOSPITAL_COMMUNITY): Payer: Medicaid Other

## 2018-12-06 DIAGNOSIS — J4520 Mild intermittent asthma, uncomplicated: Secondary | ICD-10-CM

## 2018-12-06 DIAGNOSIS — Z885 Allergy status to narcotic agent status: Secondary | ICD-10-CM | POA: Insufficient documentation

## 2018-12-06 DIAGNOSIS — I517 Cardiomegaly: Secondary | ICD-10-CM | POA: Diagnosis not present

## 2018-12-06 DIAGNOSIS — N920 Excessive and frequent menstruation with regular cycle: Secondary | ICD-10-CM | POA: Insufficient documentation

## 2018-12-06 DIAGNOSIS — Z79899 Other long term (current) drug therapy: Secondary | ICD-10-CM | POA: Insufficient documentation

## 2018-12-06 DIAGNOSIS — R112 Nausea with vomiting, unspecified: Secondary | ICD-10-CM

## 2018-12-06 DIAGNOSIS — Z91040 Latex allergy status: Secondary | ICD-10-CM | POA: Diagnosis not present

## 2018-12-06 DIAGNOSIS — A419 Sepsis, unspecified organism: Secondary | ICD-10-CM | POA: Diagnosis present

## 2018-12-06 DIAGNOSIS — D259 Leiomyoma of uterus, unspecified: Secondary | ICD-10-CM | POA: Diagnosis not present

## 2018-12-06 DIAGNOSIS — J45909 Unspecified asthma, uncomplicated: Secondary | ICD-10-CM | POA: Insufficient documentation

## 2018-12-06 DIAGNOSIS — D509 Iron deficiency anemia, unspecified: Secondary | ICD-10-CM | POA: Diagnosis not present

## 2018-12-06 DIAGNOSIS — R000 Tachycardia, unspecified: Secondary | ICD-10-CM | POA: Diagnosis not present

## 2018-12-06 DIAGNOSIS — L03115 Cellulitis of right lower limb: Principal | ICD-10-CM

## 2018-12-06 DIAGNOSIS — R590 Localized enlarged lymph nodes: Secondary | ICD-10-CM | POA: Diagnosis not present

## 2018-12-06 DIAGNOSIS — D649 Anemia, unspecified: Secondary | ICD-10-CM | POA: Diagnosis present

## 2018-12-06 DIAGNOSIS — R197 Diarrhea, unspecified: Secondary | ICD-10-CM

## 2018-12-06 DIAGNOSIS — D500 Iron deficiency anemia secondary to blood loss (chronic): Secondary | ICD-10-CM | POA: Diagnosis not present

## 2018-12-06 HISTORY — DX: Nausea with vomiting, unspecified: R11.2

## 2018-12-06 LAB — BASIC METABOLIC PANEL
Anion gap: 9 (ref 5–15)
BUN: 8 mg/dL (ref 6–20)
CALCIUM: 9 mg/dL (ref 8.9–10.3)
CO2: 25 mmol/L (ref 22–32)
Chloride: 99 mmol/L (ref 98–111)
Creatinine, Ser: 1.05 mg/dL — ABNORMAL HIGH (ref 0.44–1.00)
GFR calc Af Amer: >60 mL/min (ref >60)
GLUCOSE: 103 mg/dL — AB (ref 70–99)
Potassium: 3.6 mmol/L (ref 3.5–5.1)
Sodium: 133 mmol/L — ABNORMAL LOW (ref 135–145)

## 2018-12-06 LAB — CBC WITH DIFFERENTIAL/PLATELET
Abs Immature Granulocytes: 0.48 10*3/uL — ABNORMAL HIGH (ref 0.00–0.07)
BASOS ABS: 0.1 10*3/uL (ref 0.0–0.1)
BASOS PCT: 0 %
EOS ABS: 0 10*3/uL (ref 0.0–0.5)
EOS PCT: 0 %
HCT: 35.2 % — ABNORMAL LOW (ref 36.0–46.0)
Hemoglobin: 10.3 g/dL — ABNORMAL LOW (ref 12.0–15.0)
Immature Granulocytes: 2 %
Lymphocytes Relative: 6 %
Lymphs Abs: 1.9 10*3/uL (ref 0.7–4.0)
MCH: 18.8 pg — AB (ref 26.0–34.0)
MCHC: 29.3 g/dL — ABNORMAL LOW (ref 30.0–36.0)
MCV: 64.1 fL — AB (ref 80.0–100.0)
Monocytes Absolute: 3 10*3/uL — ABNORMAL HIGH (ref 0.1–1.0)
Monocytes Relative: 10 %
NRBC: 0 % (ref 0.0–0.2)
Neutro Abs: 26.4 10*3/uL — ABNORMAL HIGH (ref 1.7–7.7)
Neutrophils Relative %: 82 %
PLATELETS: 242 10*3/uL (ref 150–400)
RBC: 5.49 MIL/uL — AB (ref 3.87–5.11)
RDW: 17.5 % — AB (ref 11.5–15.5)
WBC: 31.9 10*3/uL — ABNORMAL HIGH (ref 4.0–10.5)

## 2018-12-06 LAB — LACTIC ACID, PLASMA
Lactic Acid, Venous: 1.3 mmol/L (ref 0.5–1.9)
Lactic Acid, Venous: 0.9 mmol/L (ref 0.5–1.9)

## 2018-12-06 LAB — I-STAT BETA HCG BLOOD, ED (MC, WL, AP ONLY): I-stat hCG, quantitative: 9.9 m[IU]/mL — ABNORMAL HIGH (ref <5)

## 2018-12-06 LAB — SEDIMENTATION RATE: SED RATE: 3 mm/h (ref 0–22)

## 2018-12-06 LAB — PROCALCITONIN: Procalcitonin: 3.14 ng/mL

## 2018-12-06 LAB — HCG, QUANTITATIVE, PREGNANCY: hCG, Beta Chain, Quant, S: 2 m[IU]/mL (ref <5)

## 2018-12-06 LAB — C-REACTIVE PROTEIN: CRP: 24 mg/dL — ABNORMAL HIGH (ref <1.0)

## 2018-12-06 LAB — D-DIMER, QUANTITATIVE: D-Dimer, Quant: 0.88 ug/mL-FEU — ABNORMAL HIGH (ref 0.00–0.50)

## 2018-12-06 LAB — HIV ANTIBODY (ROUTINE TESTING W REFLEX): HIV SCREEN 4TH GENERATION: NONREACTIVE

## 2018-12-06 MED ORDER — ONDANSETRON HCL 4 MG PO TABS: 4.0000 mg | ORAL_TABLET | Freq: Four times a day (QID) | ORAL | Status: DC | PRN

## 2018-12-06 MED ORDER — ACETAMINOPHEN 325 MG PO TABS: 650.0000 mg | ORAL_TABLET | Freq: Four times a day (QID) | ORAL | Status: DC | PRN

## 2018-12-06 MED ORDER — ENOXAPARIN SODIUM 60 MG/0.6ML ~~LOC~~ SOLN
60.0000 mg | SUBCUTANEOUS | Status: DC
  Filled 2018-12-06 (×2): qty 0.6

## 2018-12-06 MED ORDER — VANCOMYCIN HCL 10 G IV SOLR
1500.0000 mg | Freq: Three times a day (TID) | INTRAVENOUS | Status: DC
  Administered 2018-12-06: 1500 mg via INTRAVENOUS
  Filled 2018-12-06 (×3): qty 1500

## 2018-12-06 MED ORDER — VANCOMYCIN HCL 10 G IV SOLR
1500.0000 mg | INTRAVENOUS | Status: DC
  Filled 2018-12-06: qty 1500

## 2018-12-06 MED ORDER — SODIUM CHLORIDE 0.9 % IV SOLN
INTRAVENOUS | Status: DC
  Administered 2018-12-06: 100 mL/h via INTRAVENOUS
  Administered 2018-12-07: 06:00:00 via INTRAVENOUS

## 2018-12-06 MED ORDER — SODIUM CHLORIDE 0.9 % IV SOLN
2.0000 g | INTRAVENOUS | Status: DC
  Administered 2018-12-06 – 2018-12-07 (×2): 2 g via INTRAVENOUS
  Filled 2018-12-06 (×2): qty 20

## 2018-12-06 MED ORDER — IBUPROFEN 200 MG PO TABS
400.0000 mg | ORAL_TABLET | Freq: Four times a day (QID) | ORAL | Status: DC | PRN
  Administered 2018-12-06 (×2): 400 mg via ORAL
  Filled 2018-12-06 (×2): qty 2

## 2018-12-06 MED ORDER — ONDANSETRON HCL 4 MG/2ML IJ SOLN: 4.0000 mg | Freq: Four times a day (QID) | INTRAMUSCULAR | Status: DC | PRN

## 2018-12-06 MED ORDER — VANCOMYCIN HCL IN DEXTROSE 1-5 GM/200ML-% IV SOLN
1000.0000 mg | Freq: Once | INTRAVENOUS | Status: AC
  Administered 2018-12-06: 1000 mg via INTRAVENOUS
  Filled 2018-12-06: qty 200

## 2018-12-06 MED ORDER — ALBUTEROL SULFATE (2.5 MG/3ML) 0.083% IN NEBU: 2.5000 mg | INHALATION_SOLUTION | Freq: Four times a day (QID) | RESPIRATORY_TRACT | Status: DC | PRN

## 2018-12-06 MED ORDER — SODIUM CHLORIDE 0.9 % IV BOLUS
3500.0000 mL | Freq: Once | INTRAVENOUS | Status: AC
  Administered 2018-12-06: 3500 mL via INTRAVENOUS

## 2018-12-06 MED ORDER — BENZONATATE 100 MG PO CAPS: 200.0000 mg | ORAL_CAPSULE | ORAL | Status: DC | PRN

## 2018-12-06 MED ORDER — PIPERACILLIN-TAZOBACTAM 3.375 G IVPB 30 MIN
3.3750 g | Freq: Once | INTRAVENOUS | Status: AC
  Administered 2018-12-06: 3.375 g via INTRAVENOUS
  Filled 2018-12-06: qty 50

## 2018-12-06 NOTE — ED Provider Notes (Signed)
[facility] EMERGENCY DEPARTMENT Provider Note   CSN: [id number] Arrival date & time: 12/06/18  0115    History   Chief Complaint Chief Complaint  Patient presents with  . Leg Swelling    HPI Brenda [name] is a 33 y.o. female.     The history is provided by the patient.  Extremity Pain  This is a new problem. The current episode started 12 to 24 hours ago. The problem occurs constantly. The problem has not changed since onset.Pertinent negatives include no chest pain, no abdominal pain, no headaches and no shortness of breath. Nothing aggravates the symptoms. Nothing relieves the symptoms. She has tried nothing for the symptoms. The treatment provided no relief.  Patient with a h/o cellulitis and sepsis in 2015 presents with Right foot and shin redness, warmth and pain since Thursday am.  No fever today.  No travel, no sick contact.    Past Medical History:  Diagnosis Date  . Abnormal Pap smear   . Anemia   . Asthma    as a child, "outgrew it"    Patient Active Problem List   Diagnosis Date Noted  . Sepsis ([place]) 12/06/2018  . Cellulitis of right lower extremity 12/06/2018  . Menorrhagia 05/29/2017  . Irregular periods/menstrual cycles 05/29/2017  . Abnormal Pap smear--ASCUS with HRHPV 02/2017 05/29/2017  . 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 Latex allergy 05/29/2017  . History of 2 cesarean sections 01/29/2015  . Anemia 09/01/2012    Past Surgical History:  Procedure Laterality Date  . CERVICAL CERCLAGE N/A 10/14/2014   Procedure: CERCLAGE CERVICAL;  Surgeon: Delice Lesch, MD;  Location: [facility];  Service: Gynecology;  Laterality: N/A;  . CESAREAN SECTION     2010  . CESAREAN SECTION N/A 01/29/2015   Procedure: CESAREAN SECTION;  Surgeon: Everett Graff, MD;  Location: [facility];  Service: Obstetrics;  Laterality: N/A;  . FACIAL RECONSTRUCTION SURGERY       OB History    Gravida  3   Para  2   Term  1   Preterm  1   AB  1   Living  2      SAB  1   TAB      Ectopic      Multiple  0   Live Births  2            Home Medications    Prior to Admission medications   Medication Sig Start Date End Date Taking? Authorizing Provider  acetaminophen (TYLENOL) 325 MG tablet Take 650 mg by mouth every 6 (six) hours as needed for mild pain or fever.   Yes [provider]  albuterol (PROVENTIL HFA;VENTOLIN HFA) 108 (90 Base) MCG/ACT inhaler Inhale 1-2 puffs into the lungs every 6 (six) hours as needed for wheezing or shortness of breath. 11/28/18  Yes Tacy Learn, PA-C  benzonatate (TESSALON) 200 MG capsule Take 1 capsule (200 mg total) by mouth every 8 (eight) hours for 10 days. 11/28/18 12/08/18  Tacy Learn, PA-C  norethindrone (AYGESTIN) 5 MG tablet Take 2 tablets daily. Patient not taking: Reported on 12/06/2018 02/15/18   Donnel Saxon, CNM    Family History Family History  Problem Relation Age of Onset  . Stroke Mother   . Diabetes Mother   . Hypertension Mother   . Coronary artery disease Mother   . Hypertension Father     Social History Social History   Tobacco Use  . Smoking status: Never  Smoker  . Smokeless tobacco: Never Used  Substance Use Topics  . Alcohol use: Yes    Comment: occasionally  . Drug use: No     Allergies   Oxycodone hcl; Latex; and Terconazole   Review of Systems Review of Systems  Constitutional: Negative for fever.  Respiratory: Negative for cough and shortness of breath.   Cardiovascular: Negative for chest pain.  Gastrointestinal: Negative for abdominal pain.  Musculoskeletal: Positive for arthralgias.  Skin: Positive for color change.  Neurological: Negative for headaches.  All other systems reviewed and are negative.    Physical Exam Updated Vital Signs BP (!) 124/57   Pulse 92   Temp 98.7 F (37.1 C) (Oral)   Resp 18   Ht 5\' 9"  (1.753 m)   Wt 136.1 kg   SpO2 99%   BMI 44.30 kg/m   Physical Exam Vitals signs and nursing note reviewed.   Constitutional:      Appearance: Normal appearance.  HENT:     Head: Normocephalic and atraumatic.     Nose: Nose normal.     Mouth/Throat:     Mouth: Mucous membranes are moist.     Pharynx: Oropharynx is clear.  Eyes:     Conjunctiva/sclera: Conjunctivae normal.     Pupils: Pupils are equal, round, and reactive to light.  Neck:     Musculoskeletal: Normal range of motion and neck supple.  Cardiovascular:     Rate and Rhythm: Normal rate and regular rhythm.     Pulses: Normal pulses.     Heart sounds: Normal heart sounds.  Pulmonary:     Effort: Pulmonary effort is normal. No respiratory distress.     Breath sounds: Normal breath sounds. No stridor. No wheezing, rhonchi or rales.  Chest:     Chest wall: No tenderness.  Abdominal:     General: Abdomen is flat. Bowel sounds are normal.     Tenderness: There is no abdominal tenderness. There is no guarding or rebound.  Musculoskeletal: Normal range of motion.     Right lower leg: No edema.     Left lower leg: No edema.  Skin:    General: Skin is warm and dry.     Capillary Refill: Capillary refill takes less than 2 seconds.     Findings: Erythema present.       Neurological:     General: No focal deficit present.     Mental Status: She is alert and oriented to person, place, and time.  Psychiatric:        Mood and Affect: Mood normal.        Behavior: Behavior normal.      ED Treatments / Results  Labs (all labs ordered are listed, but only abnormal results are displayed) Results for orders placed or performed during the hospital encounter of 12/06/18  CBC with Differential/Platelet  Result Value Ref Range   WBC 31.9 (H) 4.0 - 10.5 K/uL   RBC 5.49 (H) 3.87 - 5.11 MIL/uL   Hemoglobin 10.3 (L) 12.0 - 15.0 g/dL   HCT 35.2 (L) 36.0 - 46.0 %   MCV 64.1 (L) 80.0 - 100.0 fL   MCH 18.8 (L) 26.0 - 34.0 pg   MCHC 29.3 (L) 30.0 - 36.0 g/dL   RDW 17.5 (H) 11.5 - 15.5 %   Platelets 242 150 - 400 K/uL   nRBC 0.0 0.0 - 0.2  %   Neutrophils Relative % 82 %   Neutro Abs 26.4 (H) 1.7 - 7.7  K/uL   Lymphocytes Relative 6 %   Lymphs Abs 1.9 0.7 - 4.0 K/uL   Monocytes Relative 10 %   Monocytes Absolute 3.0 (H) 0.1 - 1.0 K/uL   Eosinophils Relative 0 %   Eosinophils Absolute 0.0 0.0 - 0.5 K/uL   Basophils Relative 0 %   Basophils Absolute 0.1 0.0 - 0.1 K/uL   Immature Granulocytes 2 %   Abs Immature Granulocytes 0.48 (H) 0.00 - 0.07 K/uL   Polychromasia PRESENT    Target Cells PRESENT   Basic metabolic panel  Result Value Ref Range   Sodium 133 (L) 135 - 145 mmol/L   Potassium 3.6 3.5 - 5.1 mmol/L   Chloride 99 98 - 111 mmol/L   CO2 25 22 - 32 mmol/L   Glucose, Bld 103 (H) 70 - 99 mg/dL   BUN 8 6 - 20 mg/dL   Creatinine, Ser 1.05 (H) 0.44 - 1.00 mg/dL   Calcium 9.0 8.9 - 10.3 mg/dL   GFR calc non Af Amer >60 >60 mL/min   GFR calc Af Amer >60 >60 mL/min   Anion gap 9 5 - 15  I-Stat Beta hCG blood, ED ([facility], [facility], [facility] only)  Result Value Ref Range   I-stat hCG, quantitative 9.9 (H) <5 mIU/mL   Comment 3           Dg Chest 2 View  Result Date: 11/28/2018 CLINICAL DATA:  Cough for 2 weeks. Rattling in the chest. LMP 11/08/2018. EXAM: CHEST - 2 VIEW COMPARISON:  07/05/2018 FINDINGS: The cardiopericardial silhouette is moderately enlarged. No focal consolidations or pleural effusions. No pulmonary edema. Insert osseous IMPRESSION: 1. Cardiomegaly versus pericardial effusion. 2. No pulmonary edema or focal consolidation. Electronically Signed   By: Nolon Nations M.D.   On: 11/28/2018 10:45   Dg Foot Complete Right  Result Date: 12/06/2018 CLINICAL DATA:  Cellulitis EXAM: RIGHT FOOT COMPLETE - 3+ VIEW COMPARISON:  04/26/2014 FINDINGS: Subcutaneous stranding involving the foot and lower leg. No soft tissue gas or bony erosion. No opaque foreign body. IMPRESSION: Soft tissue swelling without emphysema or foreign body. No osseous abnormalities. Electronically Signed   By: Monte Fantasia M.D.   On: 12/06/2018 04:56     EKG None  Radiology Dg Foot Complete Right  Result Date: 12/06/2018 CLINICAL DATA:  Cellulitis EXAM: RIGHT FOOT COMPLETE - 3+ VIEW COMPARISON:  04/26/2014 FINDINGS: Subcutaneous stranding involving the foot and lower leg. No soft tissue gas or bony erosion. No opaque foreign body. IMPRESSION: Soft tissue swelling without emphysema or foreign body. No osseous abnormalities. Electronically Signed   By: Monte Fantasia M.D.   On: 12/06/2018 04:56    Procedures Procedures (including critical care time)  Medications Ordered in ED Medications  vancomycin (VANCOCIN) IVPB 1000 mg/200 mL premix (has no administration in time range)  piperacillin-tazobactam (ZOSYN) IVPB 3.375 g (has no administration in time range)     Does not meet sepsis criteria at this time.  Cultures ordered.  Will admit for cellulitis with leukocytosis.    Final Clinical Impressions(s) / ED Diagnoses   Admit to medicine    Renly Guedes, MD 12/06/18 4008

## 2018-12-06 NOTE — Progress Notes (Signed)
Patient seen after midnight.  Agree with overall assessment and plan as seen on H&P.  Will discontinue vancomycin and continue ceftriaxone at this time.  Patient reports chronic history of right lower extremity swelling.  Was paged regarding medical necessity of vascular doppler ultrasound.  Checked d-dimer which was noted to be elevated at 0.88. Doppler ultrasound of the lower extremity still needed to evaluate for possible blood clot.  We will continue with current IV antibiotics and consider switching to p.o. medications in a.m.

## 2018-12-06 NOTE — ED Triage Notes (Signed)
Pt to ED via EMS with c/o R leg swelling and pain from the knee down. Pt's symptoms started around 0700 12/05/2018. Pt R foot swollen and red, pt tried to take tylenol with no relief from pain. Pre hospital vitals: 210/92, HR 100, CBG 196, 98.6. Pt has hx of sepsis and cellulitis in 2015. EMS states that they saw pt 20 hours ago and pt was febrile and vomiting. Pt states that these symptoms have since resolved.

## 2018-12-06 NOTE — ED Notes (Signed)
ED TO INPATIENT HANDOFF REPORT  ED Nurse Name and Phone #:  [place] [phone]  S Name/Age/Gender Brenda [name] 33 y.o. female Room/Bed: 015C/015C  Code Status   Code Status: Full Code  Home/SNF/Other Home Patient oriented to: self, place, time and situation Is this baseline? Yes   Triage Complete: Triage complete  Chief Complaint right foot pain  Triage Note Pt to ED via EMS with c/o R leg swelling and pain from the knee down. Pt's symptoms started around 0700 12/05/2018. Pt R foot swollen and red, pt tried to take tylenol with no relief from pain. Pre hospital vitals: 210/92, HR 100, CBG 196, 98.6. Pt has hx of sepsis and cellulitis in 2015. EMS states that they saw pt 20 hours ago and pt was febrile and vomiting. Pt states that these symptoms have since resolved.   Allergies Allergies  Allergen Reactions  . Oxycodone Hcl Hives  . Latex Itching  . Terconazole Itching    Level of Care/Admitting Diagnosis ED Disposition    ED Disposition Condition Comment   Admit  Hospital Area: [facility] [[id number]]  Level of Care: Med-Surg [16]  I expect the patient will be discharged within 24 hours: No (not a candidate for 5C-Observation unit)  Diagnosis: Cellulitis of right lower extremity [454098]  Admitting Physician: Ivor Costa [[id number]]  Attending Physician: Ivor Costa [[id number]]  PT Class (Do Not Modify): Observation [104]  PT Acc Code (Do Not Modify): Observation [10022]       B Medical/Surgery History Past Medical History:  Diagnosis Date  . Abnormal Pap smear   . Anemia   . Asthma    as a child, "outgrew it"   Past Surgical History:  Procedure Laterality Date  . CERVICAL CERCLAGE N/A 10/14/2014   Procedure: CERCLAGE CERVICAL;  Surgeon: Delice Lesch, MD;  Location: [facility];  Service: Gynecology;  Laterality: N/A;  . CESAREAN SECTION     2010  . CESAREAN SECTION N/A 01/29/2015   Procedure: CESAREAN SECTION;  Surgeon: Everett Graff, MD;  Location: [facility];  Service: Obstetrics;  Laterality: N/A;  . FACIAL RECONSTRUCTION SURGERY       A IV Location/Drains/Wounds Patient Lines/Drains/Airways Status   Active Line/Drains/Airways    Name:   Placement date:   Placement time:   Site:   Days:   Peripheral IV 12/06/18 Left Hand   12/06/18    0500    Hand   less than 1          Intake/Output Last 24 hours No intake or output data in the 24 hours ending 12/06/18 1191  Labs/Imaging Results for orders placed or performed during the hospital encounter of 12/06/18 (from the past 48 hour(s))  CBC with Differential/Platelet     Status: Abnormal   Collection Time: 12/06/18  3:14 AM  Result Value Ref Range   WBC 31.9 (H) 4.0 - 10.5 K/uL    Comment: REPEATED TO VERIFY   RBC 5.49 (H) 3.87 - 5.11 MIL/uL   Hemoglobin 10.3 (L) 12.0 - 15.0 g/dL   HCT 35.2 (L) 36.0 - 46.0 %   MCV 64.1 (L) 80.0 - 100.0 fL   MCH 18.8 (L) 26.0 - 34.0 pg   MCHC 29.3 (L) 30.0 - 36.0 g/dL   RDW 17.5 (H) 11.5 - 15.5 %   Platelets 242 150 - 400 K/uL   nRBC 0.0 0.0 - 0.2 %   Neutrophils Relative % 82 %   Neutro Abs 26.4 (H) 1.7 - 7.7  K/uL   Lymphocytes Relative 6 %   Lymphs Abs 1.9 0.7 - 4.0 K/uL   Monocytes Relative 10 %   Monocytes Absolute 3.0 (H) 0.1 - 1.0 K/uL   Eosinophils Relative 0 %   Eosinophils Absolute 0.0 0.0 - 0.5 K/uL   Basophils Relative 0 %   Basophils Absolute 0.1 0.0 - 0.1 K/uL   Immature Granulocytes 2 %   Abs Immature Granulocytes 0.48 (H) 0.00 - 0.07 K/uL   Polychromasia PRESENT    Target Cells PRESENT     Comment: Performed at [facility] [address] [place], [place] [address]  Basic metabolic panel     Status: Abnormal   Collection Time: 12/06/18  3:14 AM  Result Value Ref Range   Sodium 133 (L) 135 - 145 mmol/L   Potassium 3.6 3.5 - 5.1 mmol/L   Chloride 99 98 - 111 mmol/L   CO2 25 22 - 32 mmol/L   Glucose, Bld 103 (H) 70 - 99 mg/dL   BUN 8 6 - 20 mg/dL   Creatinine, Ser 1.05 (H) 0.44 - 1.00 mg/dL   Calcium 9.0 8.9 -  10.3 mg/dL   GFR calc non Af Amer >60 >60 mL/min   GFR calc Af Amer >60 >60 mL/min   Anion gap 9 5 - 15    Comment: Performed at [facility], [address], [place],  [address]  I-Stat Beta hCG blood, ED ([facility], [facility], [facility] only)     Status: Abnormal   Collection Time: 12/06/18  3:30 AM  Result Value Ref Range   I-stat hCG, quantitative 9.9 (H) <5 mIU/mL   Comment 3            Comment:   GEST. AGE      CONC.  (mIU/mL)   <=1 WEEK        5 - 50     2 WEEKS       50 - 500     3 WEEKS       100 - 10,000     4 WEEKS     1,000 - 30,000        FEMALE AND NON-PREGNANT FEMALE:     LESS THAN 5 mIU/mL   hCG, quantitative, pregnancy     Status: None   Collection Time: 12/06/18  3:45 AM  Result Value Ref Range   hCG, Beta Chain, Quant, S 2 <5 mIU/mL    Comment:          GEST. AGE      CONC.  (mIU/mL)   <=1 WEEK        5 - 50     2 WEEKS       50 - 500     3 WEEKS       100 - 10,000     4 WEEKS     1,000 - 30,000     5 WEEKS     3,500 - 115,000   6-8 WEEKS     12,000 - 270,000    12 WEEKS     15,000 - 220,000        FEMALE AND NON-PREGNANT FEMALE:     LESS THAN 5 mIU/mL Performed at [facility], [place] [address], [place],  [address]    Dg Chest Port 1 View  Result Date: 12/06/2018 CLINICAL DATA:  Sepsis. EXAM: PORTABLE CHEST 1 VIEW COMPARISON:  11/28/2018. FINDINGS: Mediastinum and hilar structures normal. Low lung  volumes. No focal infiltrate. No pleural effusion or pneumothorax. Stable cardiomegaly. No pulmonary venous congestion. No acute bony abnormality. IMPRESSION: 1.  Stable cardiomegaly.  No pulmonary venous congestion. 2.  Low lung volumes.  No focal infiltrate identified. Electronically Signed   By: Marcello Moores  Register   On: 12/06/2018 06:15   Dg Foot Complete Right  Result Date: 12/06/2018 CLINICAL DATA:  Cellulitis EXAM: RIGHT FOOT COMPLETE - 3+ VIEW COMPARISON:  04/26/2014 FINDINGS: Subcutaneous stranding involving the foot and lower leg. No soft tissue gas or  bony erosion. No opaque foreign body. IMPRESSION: Soft tissue swelling without emphysema or foreign body. No osseous abnormalities. Electronically Signed   By: Monte Fantasia M.D.   On: 12/06/2018 04:56    Pending Labs Unresulted Labs (From admission, onward)    Start     Ordered   12/07/18 1638  Basic metabolic panel  Tomorrow morning,   R     12/06/18 0548   12/07/18 0500  CBC  Tomorrow morning,   R     12/06/18 0548   12/06/18 0550  Lactic acid, plasma  STAT Now then every 3 hours,   STAT     12/06/18 0549   12/06/18 0550  Procalcitonin  ONCE - STAT,   R     12/06/18 0549   12/06/18 0547  Gastrointestinal Panel by PCR , Stool  (Gastrointestinal Panel by PCR, Stool)  Once,   R     12/06/18 0546   12/06/18 0547  HIV antibody (Routine Testing)  Once,   R     12/06/18 0548   12/06/18 0545  C-reactive protein  Once,   R     12/06/18 0546   12/06/18 0545  Sedimentation rate  Once,   R     12/06/18 0546   12/06/18 0410  Blood culture (routine x 2)  BLOOD CULTURE X 2,   STAT     12/06/18 0411          Vitals/Pain Today's Vitals   12/06/18 0415 12/06/18 0430 12/06/18 0500 12/06/18 0607  BP: 136/79     Pulse: 94     Resp:      Temp:    99.7 F (37.6 C)  TempSrc:    Oral  SpO2: 98%     Weight:      Height:      PainSc:  Asleep 8      Isolation Precautions Enteric precautions (UV disinfection)  Medications Medications  sodium chloride 0.9 % bolus 3,500 mL (has no administration in time range)  0.9 %  sodium chloride infusion (has no administration in time range)  acetaminophen (TYLENOL) tablet 650 mg (has no administration in time range)  albuterol (PROVENTIL) (2.5 MG/3ML) 0.083% nebulizer solution 2.5 mg (has no administration in time range)  benzonatate (TESSALON) capsule 200 mg (has no administration in time range)  enoxaparin (LOVENOX) injection 60 mg (has no administration in time range)  ondansetron (ZOFRAN) tablet 4 mg (has no administration in time range)    Or   ondansetron (ZOFRAN) injection 4 mg (has no administration in time range)  cefTRIAXone (ROCEPHIN) 2 g in sodium chloride 0.9 % 100 mL IVPB (has no administration in time range)  vancomycin (VANCOCIN) 1,500 mg in sodium chloride 0.9 % 500 mL IVPB (has no administration in time range)  vancomycin (VANCOCIN) 1,500 mg in sodium chloride 0.9 % 500 mL IVPB (has no administration in time range)  vancomycin (VANCOCIN) IVPB 1000 mg/200 mL premix (0 mg Intravenous Stopped  12/06/18 7494)  piperacillin-tazobactam (ZOSYN) IVPB 3.375 g (0 g Intravenous Stopped 12/06/18 0540)    Mobility walks with person assist Low fall risk   Focused Assessments Peripheral vascular   R Recommendations: See Admitting Provider Note  Report given to:   Additional Notes:  Pt with cellulitis to right lower leg. Has hx of this, recently had a stomach bug causing loose stools and fevers

## 2018-12-06 NOTE — ED Notes (Signed)
Breakfast ordered 

## 2018-12-06 NOTE — Progress Notes (Signed)
Pharmacy Antibiotic Note  Brenda [name] is a 33 y.o. female admitted on 12/06/2018 with sepsis and cellulitis.  Pharmacy has been consulted for vancomycin dosing.  Plan: Rec'd vanc 1g in ED. Vancomycin 1500mg  IV Q8H. Goal AUC 400-550.  Expected AUC 475.  SCr used 1.05.  Height: 5\' 9"  (175.3 cm) Weight: 300 lb (136.1 kg) IBW/kg (Calculated) : 66.2  Temp (24hrs), Avg:98.7 F (37.1 C), Min:98.7 F (37.1 C), Max:98.7 F (37.1 C)  Recent Labs  Lab 12/06/18 0314  WBC 31.9*  CREATININE 1.05*    Estimated Creatinine Clearance: 112.3 mL/min (A) (by C-G formula based on SCr of 1.05 mg/dL (H)).    Allergies  Allergen Reactions  . Oxycodone Hcl Hives  . Latex Itching  . Terconazole Itching    Thank you for allowing pharmacy to be a part of this patient's care.  Wynona Neat, PharmD, BCPS  12/06/2018 6:05 AM

## 2018-12-06 NOTE — H&P (Signed)
History and Physical    [name] QIO:[id number] DOB: [birth year] DOA: 12/06/2018  Referring MD/NP/PA:   PCP: [facility]   Patient coming from:  The patient is coming from home.  At baseline, pt is independent for most of ADL.        Chief Complaint: right leg pain and swelling, nausea, vomiting, diarrhea  HPI: Brenda [name] is a 33 y.o. female with medical history significant of asthma, anemia, menorrhagia, right leg cellulitis 2015, who presents with right leg pain and swelling, nausea, vomiting, diarrhea.  Patient states that her symptoms started yesterday, including right leg and foot pain, swelling and erythema.  The pain is constant, sharp, 8 out of 10 severity, nonradiating.  No injury.  She had fever at one time, currently no fever or chills.  Patient also reports that she has nausea, vomiting, diarrhea since yesterday.  She has had nonbloody non-biliary vomiting twice since yesterday.  She has had 3-4 times of loose stool bowel movement today.  Denies abdominal pain.  Patient does not have cough, sore throat, runny nose.  No sick contacts.  No recent long distance traveling.  No other risk exposure.  No active vaginal bleeding.  Patient does not have symptoms of UTI or unilateral weakness.  ED Course: pt was found to have WBC 31.9, beta-hCG +9.9, pending quantitative beta hCG, electrolytes renal function okay, temperature normal, tachycardia, oxygen saturation 99% on room air.  X-ray of right foot showed soft tissue swelling without foreign body.  Patient is placed on MedSurg bed for observation.  Review of Systems:   General: no fevers, chills, no body weight gain, has fatigue HEENT: no blurry vision, hearing changes or sore throat Respiratory: no dyspnea, coughing, wheezing CV: no chest pain, no palpitations GI: Has nausea, vomiting, abdominal pain, diarrhea, no constipation GU: no dysuria, burning on urination, increased urinary frequency, hematuria  Ext: Has pain,  swelling, erythema in the right lower leg and foot. Neuro: no unilateral weakness, numbness, or tingling, no vision change or hearing loss Skin: no rash, no skin tear. MSK: No muscle spasm, no deformity, no limitation of range of movement in spin Heme: No easy bruising.  Travel history: No recent long distant travel.  Allergy:  Allergies  Allergen Reactions   Oxycodone Hcl Hives   Latex Itching   Terconazole Itching    Past Medical History:  Diagnosis Date   Abnormal Pap smear    Anemia    Asthma    as a child, "outgrew it"    Past Surgical History:  Procedure Laterality Date   CERVICAL CERCLAGE N/A 10/14/2014   Procedure: CERCLAGE CERVICAL;  Surgeon: Delice Lesch, MD;  Location: [facility];  Service: Gynecology;  Laterality: N/A;   CESAREAN SECTION     2010   CESAREAN SECTION N/A 01/29/2015   Procedure: CESAREAN SECTION;  Surgeon: Everett Graff, MD;  Location: [facility];  Service: Obstetrics;  Laterality: N/A;   FACIAL RECONSTRUCTION SURGERY      Social History:  reports that she has never smoked. She has never used smokeless tobacco. She reports current alcohol use. She reports that she does not use drugs.  Family History:  Family History  Problem Relation Age of Onset   Stroke Mother    Diabetes Mother    Hypertension Mother    Coronary artery disease Mother    Hypertension Father      Prior to Admission medications   Medication Sig Start Date End Date Taking? Authorizing Provider  acetaminophen (TYLENOL) 325 MG tablet Take 650 mg by mouth every 6 (six) hours as needed for mild pain or fever.   Yes [provider]  albuterol (PROVENTIL HFA;VENTOLIN HFA) 108 (90 Base) MCG/ACT inhaler Inhale 1-2 puffs into the lungs every 6 (six) hours as needed for wheezing or shortness of breath. 11/28/18  Yes Tacy Learn, PA-C  benzonatate (TESSALON) 200 MG capsule Take 1 capsule (200 mg total) by mouth every 8 (eight) hours for 10 days. 11/28/18 12/08/18   Tacy Learn, PA-C  norethindrone (AYGESTIN) 5 MG tablet Take 2 tablets daily. Patient not taking: Reported on 12/06/2018 02/15/18   Donnel Saxon, CNM    Physical Exam: Vitals:   12/06/18 0215 12/06/18 0245 12/06/18 0330 12/06/18 0415  BP: 140/80 130/76 (!) 124/57 136/79  Pulse: 95 (!) 103 92 94  Resp:      Temp:      TempSrc:      SpO2: 100% 98% 99% 98%  Weight:      Height:       General: Not in acute distress HEENT:       Eyes: PERRL, EOMI, no scleral icterus.       ENT: No discharge from the ears and nose, no pharynx injection, no tonsillar enlargement.        Neck: No JVD, no bruit, no mass felt. Heme: No neck lymph node enlargement. Cardiac: S1/S2, RRR, No murmurs, No gallops or rubs. Respiratory: No rales, wheezing, rhonchi or rubs. GI: Soft, nondistended, nontender, no rebound pain, no organomegaly, BS present. GU: No hematuria Ext: 2+DP/PT pulse bilaterally.  Patient has erythema, tenderness, swelling, warmth in right lower leg and foot. Musculoskeletal: No joint deformities, No joint redness or warmth, no limitation of ROM in spin. Skin: No rashes.  Neuro: Alert, oriented X3, cranial nerves II-XII grossly intact, moves all extremities normally.  Psych: Patient is not psychotic, no suicidal or hemocidal ideation.  Labs on Admission: I have personally reviewed following labs and imaging studies  CBC: Recent Labs  Lab 12/06/18 0314  WBC 31.9*  NEUTROABS 26.4*  HGB 10.3*  HCT 35.2*  MCV 64.1*  PLT 384   Basic Metabolic Panel: Recent Labs  Lab 12/06/18 0314  NA 133*  K 3.6  CL 99  CO2 25  GLUCOSE 103*  BUN 8  CREATININE 1.05*  CALCIUM 9.0   GFR: Estimated Creatinine Clearance: 112.3 mL/min (A) (by C-G formula based on SCr of 1.05 mg/dL (H)). Liver Function Tests: No results for input(s): AST, ALT, ALKPHOS, BILITOT, PROT, ALBUMIN in the last 168 hours. No results for input(s): LIPASE, AMYLASE in the last 168 hours. No results for input(s): AMMONIA  in the last 168 hours. Coagulation Profile: No results for input(s): INR, PROTIME in the last 168 hours. Cardiac Enzymes: No results for input(s): CKTOTAL, CKMB, CKMBINDEX, TROPONINI in the last 168 hours. BNP (last 3 results) No results for input(s): PROBNP in the last 8760 hours. HbA1C: No results for input(s): HGBA1C in the last 72 hours. CBG: No results for input(s): GLUCAP in the last 168 hours. Lipid Profile: No results for input(s): CHOL, HDL, LDLCALC, TRIG, CHOLHDL, LDLDIRECT in the last 72 hours. Thyroid Function Tests: No results for input(s): TSH, T4TOTAL, FREET4, T3FREE, THYROIDAB in the last 72 hours. Anemia Panel: No results for input(s): VITAMINB12, FOLATE, FERRITIN, TIBC, IRON, RETICCTPCT in the last 72 hours. Urine analysis:    Component Value Date/Time   COLORURINE YELLOW 05/29/2017 [place] 05/29/2017 1038  LABSPEC 1.014 05/29/2017 1038   PHURINE 5.0 05/29/2017 1038   GLUCOSEU NEGATIVE 05/29/2017 1038   HGBUR NEGATIVE 05/29/2017 [place] 05/29/2017 1038   KETONESUR NEGATIVE 05/29/2017 1038   PROTEINUR NEGATIVE 05/29/2017 1038   UROBILINOGEN 0.2 09/30/2014 1551   NITRITE NEGATIVE 05/29/2017 1038   LEUKOCYTESUR NEGATIVE 05/29/2017 1038   Sepsis Labs: '@LABRCNTIP' (procalcitonin:4,lacticidven:4) )No results found for this or any previous visit (from the past 240 hour(s)).   Radiological Exams on Admission: Dg Foot Complete Right  Result Date: 12/06/2018 CLINICAL DATA:  Cellulitis EXAM: RIGHT FOOT COMPLETE - 3+ VIEW COMPARISON:  04/26/2014 FINDINGS: Subcutaneous stranding involving the foot and lower leg. No soft tissue gas or bony erosion. No opaque foreign body. IMPRESSION: Soft tissue swelling without emphysema or foreign body. No osseous abnormalities. Electronically Signed   By: Monte Fantasia M.D.   On: 12/06/2018 04:56     EKG:  Not done in ED.   Asessment/Plan Principal Problem:   Cellulitis of right lower  extremity Active Problems:   Anemia   Sepsis (HCC)   Asthma   Nausea vomiting and diarrhea   Sepsis due to cellulitis of right lower extremity: Patient has recurrent right leg cellulitis.  No injury.  Patient meets criteria for sepsis with leukocytosis and tachycardia.  No fever.  Pending lactic acid level.  Currently hemodynamically stable.  - will place on tele bed for obs - Empiric antimicrobial treatment with vancomycin and Rocephin (patient received 1 dose of Zosyn in the ED - PRN Zofran for nausea, prn Ibuprofen and Tylenol for pain - Blood cultures x 2  - ESR and CRP - will get Procalcitonin and trend lactic acid levels per sepsis protocol. - IVF: 3.5 L of NS bolus in ED, followed by 100 cc/h - LE doppler to r/o DVT  Anemia: patient has microcytic anemia, possibly secondary to menorrhagia.  Currently patient does not have vaginal bleeding.  Hemoglobin stable at 10.3. -Follow-up by CBC  Asthma:Stable -PRN albuterol inhaler  Nausea vomiting and diarrhea: Likely due to viral gastroenteritis.  Patient does not have fever or chills.  No respiratory symptoms.  No risk exposure. No traveling.  Low suspicions for COVID19 -Check GI pathogen panel -IV fluid as above -PRN Zofran for nausea and vomiting   DVT ppx: SQ Lovenox Code Status: Full code Family Communication: None at bed side.  Disposition Plan:  Anticipate discharge back to previous home environment Consults called:  none Admission status:   medical floor/obs      Date of Service 12/06/2018    [facility]   If 7PM-7AM, please contact night-coverage [email or web address] Password [facility] 12/06/2018, 5:58 AM

## 2018-12-07 ENCOUNTER — Observation Stay (HOSPITAL_BASED_OUTPATIENT_CLINIC_OR_DEPARTMENT_OTHER): Payer: Medicaid Other

## 2018-12-07 ENCOUNTER — Encounter (HOSPITAL_COMMUNITY): Payer: Medicaid Other

## 2018-12-07 DIAGNOSIS — R590 Localized enlarged lymph nodes: Secondary | ICD-10-CM

## 2018-12-07 DIAGNOSIS — J4520 Mild intermittent asthma, uncomplicated: Secondary | ICD-10-CM | POA: Diagnosis not present

## 2018-12-07 DIAGNOSIS — R7989 Other specified abnormal findings of blood chemistry: Secondary | ICD-10-CM | POA: Diagnosis not present

## 2018-12-07 DIAGNOSIS — D500 Iron deficiency anemia secondary to blood loss (chronic): Secondary | ICD-10-CM | POA: Diagnosis not present

## 2018-12-07 DIAGNOSIS — A419 Sepsis, unspecified organism: Secondary | ICD-10-CM | POA: Diagnosis not present

## 2018-12-07 DIAGNOSIS — L03115 Cellulitis of right lower limb: Secondary | ICD-10-CM | POA: Diagnosis not present

## 2018-12-07 DIAGNOSIS — I517 Cardiomegaly: Secondary | ICD-10-CM

## 2018-12-07 LAB — BASIC METABOLIC PANEL
Anion gap: 8 (ref 5–15)
BUN: 10 mg/dL (ref 6–20)
CO2: 24 mmol/L (ref 22–32)
CREATININE: 0.75 mg/dL (ref 0.44–1.00)
Calcium: 8.5 mg/dL — ABNORMAL LOW (ref 8.9–10.3)
Chloride: 106 mmol/L (ref 98–111)
GFR calc Af Amer: >60 mL/min (ref >60)
GFR calc non Af Amer: >60 mL/min (ref >60)
Glucose, Bld: 98 mg/dL (ref 70–99)
Potassium: 3.9 mmol/L (ref 3.5–5.1)
Sodium: 138 mmol/L (ref 135–145)

## 2018-12-07 LAB — CBC
HCT: 30.7 % — ABNORMAL LOW (ref 36.0–46.0)
Hemoglobin: 9.1 g/dL — ABNORMAL LOW (ref 12.0–15.0)
MCH: 18.7 pg — ABNORMAL LOW (ref 26.0–34.0)
MCHC: 29.6 g/dL — ABNORMAL LOW (ref 30.0–36.0)
MCV: 63 fL — AB (ref 80.0–100.0)
Platelets: 204 10*3/uL (ref 150–400)
RBC: 4.87 MIL/uL (ref 3.87–5.11)
RDW: 17.3 % — ABNORMAL HIGH (ref 11.5–15.5)
WBC: 20.7 10*3/uL — ABNORMAL HIGH (ref 4.0–10.5)
nRBC: 0 % (ref 0.0–0.2)

## 2018-12-07 MED ORDER — FUROSEMIDE 20 MG PO TABS: 20.0000 mg | ORAL_TABLET | Freq: Every day | ORAL | 0 refills | Status: AC | PRN

## 2018-12-07 MED ORDER — CEFDINIR 300 MG PO CAPS: 300.0000 mg | ORAL_CAPSULE | Freq: Two times a day (BID) | ORAL | 0 refills | Status: DC

## 2018-12-07 MED ORDER — FERROUS SULFATE 325 (65 FE) MG PO TABS: 325.0000 mg | ORAL_TABLET | Freq: Every day | ORAL | 0 refills | Status: DC

## 2018-12-07 MED ORDER — ASCORBIC ACID 500 MG PO TABS: 500.0000 mg | ORAL_TABLET | Freq: Every day | ORAL | Status: DC

## 2018-12-07 MED ORDER — DIPHENHYDRAMINE HCL 50 MG/ML IJ SOLN
12.5000 mg | Freq: Once | INTRAMUSCULAR | Status: AC
  Administered 2018-12-07: 12.5 mg via INTRAVENOUS
  Filled 2018-12-07: qty 1

## 2018-12-07 MED ORDER — FERROUS SULFATE 325 (65 FE) MG PO TABS: 325.0000 mg | ORAL_TABLET | Freq: Every day | ORAL | Status: DC

## 2018-12-07 MED ORDER — VITAMIN C 500 MG PO TABS
500.0000 mg | ORAL_TABLET | Freq: Every day | ORAL | Status: DC
  Administered 2018-12-07: 500 mg via ORAL
  Filled 2018-12-07 (×2): qty 1

## 2018-12-07 NOTE — Discharge Summary (Signed)
Brenda [name], is a 33 y.o. female  DOB September 26, 1985  MRN [id number].  Admission date:  12/06/2018  Admitting Physician  Ivor Costa, MD  Discharge Date:  12/07/2018   Primary MD  [facility]  Recommendations for primary care physician for things to follow:   -Cardiomegaly seen on chest x-ray may likely need outpatient echocardiogram to further evaluate -Large lymph nodes noted incidentally on vascular Doppler ultrasound -Anemia  -Uterine fibroids -Blood culture    Discharge Diagnosis   Principal Problem:   Cellulitis of right lower extremity Active Problems:   Anemia   Sepsis ([place])   Asthma   Nausea vomiting and diarrhea   Cardiomegaly   Pelvic lymphadenopathy      Past Medical History:  Diagnosis Date   Abnormal Pap smear    Anemia    Asthma    as a child, "outgrew it"    Past Surgical History:  Procedure Laterality Date   CERVICAL CERCLAGE N/A 10/14/2014   Procedure: CERCLAGE CERVICAL;  Surgeon: Delice Lesch, MD;  Location: [facility];  Service: Gynecology;  Laterality: N/A;   CESAREAN SECTION     2010   CESAREAN SECTION N/A 01/29/2015   Procedure: CESAREAN SECTION;  Surgeon: Everett Graff, MD;  Location: [facility];  Service: Obstetrics;  Laterality: N/A;   FACIAL RECONSTRUCTION SURGERY         HPI  from the history and physical done on the day of admission:    Brenda [name] is a 33 y.o. female with medical history significant of asthma, anemia, menorrhagia, right leg cellulitis 2015, who presents with right leg pain and swelling, nausea, vomiting, diarrhea.  Patient states that her symptoms started yesterday, including right leg and foot pain, swelling and erythema.  The pain is constant, sharp, 8 out of 10 severity, nonradiating.  No injury.  She had fever at one time, currently no fever or  chills.  Patient also reports that she has nausea, vomiting, diarrhea since yesterday.  She has had nonbloody non-biliary vomiting twice since yesterday.  She has had 3-4 times of loose stool bowel movement today.  Denies abdominal pain.  Patient does not have cough, sore throat, runny nose.  No sick contacts.  No recent long distance traveling.  No other risk exposure.  No active vaginal bleeding.  Patient does not have symptoms of UTI or unilateral weakness.  ED Course: pt was found to have WBC 31.9, beta-hCG +9.9, pending quantitative beta hCG, electrolytes renal function okay, temperature normal, tachycardia, oxygen saturation 99% on room air.  X-ray of right foot showed soft tissue swelling without foreign body.  Patient is placed on MedSurg bed for observation.      Hospital Course:     1.  Sepsis secondary to cellulitis: Patient presented with right leg swelling and erythema.  No wound or drainage seen on physical exam.  X-rays of the right leg showed subcutaneous swelling without signs of osseous damage. CRP was significantly elevated at 24, but ESR was 3.  Met  sepsis criteria with leukocytosis and tachycardia.  Initial lactic acid obtained was within normal limits, but was obtained an hour after initial lab work completed. Patient was given full fluid bolus and started on empiric antibiotics of Zosyn.  Antibiotics were changed to vancomycin and Rocephin.  On hospital day 2, patient was noted to have good improvement of erythema symptoms.  Vascular Doppler ultrasound was done negative for signs of a blood clot after patient had elevated d-dimer 0.88. Blood cultures had shown no growth.  Switched to oral antibiotics of cefdinir.   2.  Microcytic anemia, history of uterine fibroids: Chronic.  Hemoglobin was noted to be 10.3 g/dL with low MCV and MCH on admission which appear to be around patient's baseline.  Patient notes history of uterine fibroids, but denied any reports of bleeding except for  menstrual cycle.  Hemoglobin prior to discharge noted to be around 9.1 g/dL. Patient was started on ferrous sulfate and counseled on the importance iron to make red blood cells.   3.  Lower extremity edema: Acute on chronic.  Patient reports right lower extremity swelling at baseline that was worse than normal.  No blood clot noted on vascular Doppler ultrasound.  4.  Nausea, vomiting, and diarrhea: Resolved.  Likely secondary to a viral gastroenteritis.  Low suspicion for COVID 19.  Patient was able to tolerate diet prior to discharge.  Testing of stools was not performed.  5.  History of asthma: Stable.  Patient was given breathing treatments as needed.   6.  Cardiomegaly: Acute.  Previously seen on chest x-ray during last ED visit on 3/12, after patient had viral upper respiratory illness.  Chest x-ray noted cardiomegaly versus pericardial effusion.  Suspected to be pericarditis for which patient was advised to take ibuprofen.  Patient denies having any chest pains currently.  Recommended outpatient echocardiogram and follow-up.  7.  Pelvic lymphadenopathy: Incidental finding seen on Doppler ultrasound of the lower extremities of enlarged lymph nodes of the groin area.  Unclear cause of symptoms, but could be related with patient's history of uterine fibroids. Will need outpatient work-up.    8.  Positive i-STAT beta hCG quantitative: Ruled out pregnancy.  Patient had positive beta-hCG of 9.9.  Patient reported not being on birth control.  However, repeat serum beta hCG study was 2.    Follow UP   Recommended patient follow-up with primary care provider within 1 to 2 weeks  Consults obtained : None  Discharge Condition: Stable  Diet and Activity recommendation: See Discharge Instructions below   Discharge Instructions    Ambulatory referral to Internal Medicine   Complete by:  As directed    Needs new primary doctor ok for IM or Family medicine   Diet - low sodium heart healthy    Complete by:  As directed    Discharge instructions   Complete by:  As directed    Follow with Primary MD [facility] within 7 to 10 days.  Your presenting symptoms are thought to be secondary to cellulitis.  Please complete antibiotics as prescribed that will be available at your pharmacy.  The ultrasound of your right lower extremity did not show any signs of a blood clot.  There were some incidental findings that you need to follow-up with your primary care provider for including: Enlarged heart noted on chest x-ray for which echocardiogram/ultrasound of the heart may be needed in outpatient setting, enlarged groin lymph nodes noted on Doppler ultrasound of the lower extremities, and anemia  for which you were started on iron supplementation. A referral was sent to [place] to try and establish with a new primary doctor.   Get CBC and BMP-  checked  by Primary MD or SNF MD in 5-7 days ( we routinely change or add medications that can affect your baseline labs and fluid status, therefore we recommend that you get the mentioned basic workup next visit with your PCP, your PCP may decide not to get them or add new tests based on their clinical decision)  Activity: As tolerated  Disposition: Home   Diet: Heart Healthy  Special Instructions: If you have smoked or chewed Tobacco  in the last 2 yrs please stop smoking, stop any regular Alcohol  and or any Recreational drug use.  On your next visit with your primary care physician please Get Medicines reviewed and adjusted.  Please request your [facility] to go over all Hospital Tests and Procedure/Radiological results at the follow up, please get all Hospital records sent to your Prim MD by signing hospital release before you go home.  If you experience worsening of your admission symptoms, develop shortness of breath, life threatening emergency, suicidal or homicidal thoughts you must seek medical attention immediately by calling 911 or  calling your MD immediately  if symptoms less severe.  You Must read complete instructions/literature along with all the possible adverse reactions/side effects for all the Medicines you take and that have been prescribed to you. Take any new Medicines after you have completely understood and accpet all the possible adverse reactions/side effects.   Do not drive, operate heavy machinery, perform activities at heights, swimming or participation in water activities or provide baby sitting services if your were admitted for syncope or siezures until you have seen by Primary MD or a Neurologist and advised to do so again.  Do not drive when taking Pain medications.  Do not take more than prescribed Pain, Sleep and Anxiety Medications  Wear Seat belts while driving.   Please note  You were cared for by a hospitalist during your hospital stay. If you have any questions about your discharge medications or the care you received while you were in the hospital after you are discharged, you can call the unit and asked to speak with the hospitalist on call if the hospitalist that took care of you is not available. Once you are discharged, your primary care physician will handle any further medical issues. Please note that NO REFILLS for any discharge medications will be authorized once you are discharged, as it is imperative that you return to your primary care physician (or establish a relationship with a primary care physician if you do not have one) for your aftercare needs so that they can reassess your need for medications and monitor your lab values.        Discharge Medications     Allergies as of 12/07/2018      Reactions   Oxycodone Hcl Hives   Latex Itching   Terconazole Itching      Medication List    STOP taking these medications   norethindrone 5 MG tablet Commonly known as:  AYGESTIN     TAKE these medications   acetaminophen 325 MG tablet Commonly known as:  TYLENOL Take 650  mg by mouth every 6 (six) hours as needed for mild pain or fever.   albuterol 108 (90 Base) MCG/ACT inhaler Commonly known as:  PROVENTIL HFA;VENTOLIN HFA Inhale 1-2 puffs into the lungs every  6 (six) hours as needed for wheezing or shortness of breath.   ascorbic acid 500 MG tablet Commonly known as:  VITAMIN C Take 1 tablet (500 mg total) by mouth daily with breakfast.   benzonatate 200 MG capsule Commonly known as:  TESSALON Take 1 capsule (200 mg total) by mouth every 8 (eight) hours for 10 days.   cefdinir 300 MG capsule Commonly known as:  OMNICEF Take 1 capsule (300 mg total) by mouth 2 (two) times daily. Start taking on:  December 08, 2018   ferrous sulfate 325 (65 FE) MG tablet Take 1 tablet (325 mg total) by mouth daily with breakfast. Start taking on:  December 08, 2018   furosemide 20 MG tablet Commonly known as:  LASIX Take 1 tablet (20 mg total) by mouth daily as needed for fluid.       Major procedures and Radiology Reports - PLEASE review detailed and final reports for all details, in brief -      Dg Chest 2 View  Result Date: 11/28/2018 CLINICAL DATA:  Cough for 2 weeks. Rattling in the chest. LMP 11/08/2018. EXAM: CHEST - 2 VIEW COMPARISON:  07/05/2018 FINDINGS: The cardiopericardial silhouette is moderately enlarged. No focal consolidations or pleural effusions. No pulmonary edema. Insert osseous IMPRESSION: 1. Cardiomegaly versus pericardial effusion. 2. No pulmonary edema or focal consolidation. Electronically Signed   By: Nolon Nations M.D.   On: 11/28/2018 10:45   Dg Chest Port 1 View  Result Date: 12/06/2018 CLINICAL DATA:  Sepsis. EXAM: PORTABLE CHEST 1 VIEW COMPARISON:  11/28/2018. FINDINGS: Mediastinum and hilar structures normal. Low lung volumes. No focal infiltrate. No pleural effusion or pneumothorax. Stable cardiomegaly. No pulmonary venous congestion. No acute bony abnormality. IMPRESSION: 1.  Stable cardiomegaly.  No pulmonary venous congestion.  2.  Low lung volumes.  No focal infiltrate identified. Electronically Signed   By: Marcello Moores  Register   On: 12/06/2018 06:15   Dg Foot Complete Right  Result Date: 12/06/2018 CLINICAL DATA:  Cellulitis EXAM: RIGHT FOOT COMPLETE - 3+ VIEW COMPARISON:  04/26/2014 FINDINGS: Subcutaneous stranding involving the foot and lower leg. No soft tissue gas or bony erosion. No opaque foreign body. IMPRESSION: Soft tissue swelling without emphysema or foreign body. No osseous abnormalities. Electronically Signed   By: Monte Fantasia M.D.   On: 12/06/2018 04:56   Vas [place] Lower Extremity Venous (dvt)  Result Date: 12/07/2018  Lower Venous Study Indications: Swelling.  Comparison Study: No prior study on file for comparison Performing Technologist: Sharion Dove RVS  Examination Guidelines: A complete evaluation includes B-mode imaging, spectral Doppler, color Doppler, and power Doppler as needed of all accessible portions of each vessel. Bilateral testing is considered an integral part of a complete examination. Limited examinations for reoccurring indications may be performed as noted.  Right Venous Findings: +---------+---------------+---------+-----------+----------+-------+            Compressibility Phasicity Spontaneity Properties Summary  +---------+---------------+---------+-----------+----------+-------+  CFV       Full            Yes       Yes                             +---------+---------------+---------+-----------+----------+-------+  SFJ       Full                                                      +---------+---------------+---------+-----------+----------+-------+  FV Prox   Full                                                      +---------+---------------+---------+-----------+----------+-------+  FV Mid    Full                                                      +---------+---------------+---------+-----------+----------+-------+  FV Distal Full                                                       +---------+---------------+---------+-----------+----------+-------+  PFV       Full                                                      +---------+---------------+---------+-----------+----------+-------+  POP       Full            Yes       Yes                             +---------+---------------+---------+-----------+----------+-------+  PTV       Full                                                      +---------+---------------+---------+-----------+----------+-------+  PERO      Full                                                      +---------+---------------+---------+-----------+----------+-------+  Left Venous Findings: +---+---------------+---------+-----------+----------+-------+      Compressibility Phasicity Spontaneity Properties Summary  +---+---------------+---------+-----------+----------+-------+  CFV Full            Yes       Yes                             +---+---------------+---------+-----------+----------+-------+    Summary: Right: There is no evidence of deep vein thrombosis in the lower extremity. Ultrasound characteristics of enlarged lymph nodes are noted in the groin. Left: No evidence of common femoral vein obstruction.  *See table(s) above for measurements and observations.    Preliminary     Micro Results     Recent Results (from the past 240 hour(s))  Blood culture (routine x 2)     Status: None (Preliminary result)   Collection Time: 12/06/18  4:20 AM  Result Value Ref Range Status   Specimen Description BLOOD LEFT HAND  Final   Special Requests  Final    BOTTLES DRAWN AEROBIC AND ANAEROBIC Blood Culture adequate volume   Culture   Final    NO GROWTH 1 DAY Performed at [facility], [place] [address], [place], [place] [address]    Report Status PENDING  Incomplete  Blood culture (routine x 2)     Status: None (Preliminary result)   Collection Time: 12/06/18  4:32 AM  Result Value Ref Range Status   Specimen Description BLOOD LEFT WRIST  Final    Special Requests   Final    BOTTLES DRAWN AEROBIC AND ANAEROBIC Blood Culture adequate volume   Culture   Final    NO GROWTH 1 DAY Performed at [facility], [place] [address], [place],  [address]    Report Status PENDING  Incomplete       Today   Subjective    Brenda [name] today states that her symptoms of nausea, vomiting, diarrhea have resolved.  States that she was told that she likely had pericarditis as the cause for her enlarged heart and was told to take ibuprofen.  She has not had any other evaluation.   Objective   Blood pressure (!) 151/82, pulse 86, temperature 98.7 F (37.1 C), temperature source Oral, resp. rate 16, height '5\' 9"'  (1.753 m), weight 136.1 kg, SpO2 100 %, unknown if currently breastfeeding.   Intake/Output Summary (Last 24 hours) at 12/07/2018 1457 Last data filed at 12/07/2018 1300 Gross per 24 hour  Intake 720 ml  Output 750 ml  Net -30 ml    Exam  Constitutional: Obese female in NAD, calm, comfortable Eyes: PERRL, lids and conjunctivae normal ENMT: Mucous membranes are moist. Posterior pharynx clear of any exudate or lesions.Normal dentition.  Neck: normal, supple, no masses, no thyromegaly.  No JVD. Respiratory: clear to auscultation bilaterally, no wheezing, no crackles. Normal respiratory effort. No accessory muscle use.  Cardiovascular: Regular rate and rhythm, no murmurs / rubs / gallops.  Swelling present on the right lower extremity. 2+ pedal pulses. No carotid bruits.  Abdomen: no tenderness, no masses palpated. No hepatosplenomegaly. Bowel sounds positive.  Musculoskeletal: no clubbing / cyanosis. No joint deformity upper and lower extremities. Good ROM, no contractures. Normal muscle tone.  Skin: Erythema resolving of the right lower extremity. Neurologic: CN 2-12 grossly intact. Sensation intact, DTR normal. Strength 5/5 in all 4.  Psychiatric: Normal judgment and insight. Alert and oriented x 3. Normal mood.    Data  Review   CBC w Diff:  Lab Results  Component Value Date   WBC 20.7 (H) 12/07/2018   HGB 9.1 (L) 12/07/2018   HCT 30.7 (L) 12/07/2018   PLT 204 12/07/2018   LYMPHOPCT 6 12/06/2018   MONOPCT 10 12/06/2018   EOSPCT 0 12/06/2018   BASOPCT 0 12/06/2018    CMP:  Lab Results  Component Value Date   NA 138 12/07/2018   K 3.9 12/07/2018   CL 106 12/07/2018   CO2 24 12/07/2018   BUN 10 12/07/2018   CREATININE 0.75 12/07/2018   PROT 8.5 (H) 05/29/2017   ALBUMIN 4.4 05/29/2017   BILITOT 0.3 05/29/2017   ALKPHOS 62 05/29/2017   AST 20 05/29/2017   ALT 16 05/29/2017  .   Total Time in preparing paper work, data evaluation and todays exam - 35 minutes  Norval Morton M.D on 12/07/2018 at 2:57 PM  [facility]   Office  [phone]

## 2018-12-07 NOTE — Progress Notes (Signed)
VASCULAR LAB PRELIMINARY  PRELIMINARY  PRELIMINARY  PRELIMINARY  Venous duplex completed.    Preliminary report:  See CV Proc for preliminary results.  Rasheida Broden, RVT 12/07/2018, 4:42 PM

## 2018-12-07 NOTE — Progress Notes (Signed)
Discharge instructions and paperwork given to patient and patient did not have any questions. Patient not in pain and tolerated well.

## 2018-12-07 NOTE — Discharge Instructions (Signed)

## 2018-12-07 NOTE — Plan of Care (Signed)
  Problem: Education: Goal: Knowledge of General Education information will improve Description Including pain rating scale, medication(s)/side effects and non-pharmacologic comfort measures Outcome: Progressing   Problem: Clinical Measurements: Goal: Respiratory complications will improve Outcome: Not Applicable Goal: Cardiovascular complication will be avoided Outcome: Progressing   Problem: Activity: Goal: Risk for activity intolerance will decrease Outcome: Progressing   Problem: Nutrition: Goal: Adequate nutrition will be maintained Outcome: Progressing   Problem: Coping: Goal: Level of anxiety will decrease Outcome: Progressing

## 2018-12-09 DIAGNOSIS — I517 Cardiomegaly: Secondary | ICD-10-CM | POA: Diagnosis present

## 2018-12-09 DIAGNOSIS — R590 Localized enlarged lymph nodes: Secondary | ICD-10-CM | POA: Diagnosis present

## 2018-12-11 LAB — CULTURE, BLOOD (ROUTINE X 2)
CULTURE: NO GROWTH
Culture: NO GROWTH
SPECIAL REQUESTS: ADEQUATE
Special Requests: ADEQUATE

## 2018-12-31 ENCOUNTER — Other Ambulatory Visit: Payer: Self-pay | Admitting: Obstetrics and Gynecology

## 2019-02-11 ENCOUNTER — Ambulatory Visit (HOSPITAL_COMMUNITY): Payer: Medicaid Other

## 2019-02-12 ENCOUNTER — Other Ambulatory Visit (HOSPITAL_COMMUNITY): Payer: Self-pay | Admitting: Internal Medicine

## 2019-02-12 ENCOUNTER — Ambulatory Visit (HOSPITAL_COMMUNITY): Admission: RE | Admit: 2019-02-12 | Payer: Medicaid Other | Source: Ambulatory Visit

## 2019-02-12 DIAGNOSIS — R609 Edema, unspecified: Secondary | ICD-10-CM

## 2019-02-13 ENCOUNTER — Other Ambulatory Visit: Payer: Self-pay

## 2019-02-13 ENCOUNTER — Ambulatory Visit (HOSPITAL_COMMUNITY)
Admission: RE | Admit: 2019-02-13 | Discharge: 2019-02-13 | Disposition: A | Discharge status: Home / Self Care | Payer: Medicaid Other | Source: Ambulatory Visit | Attending: Internal Medicine | Admitting: Internal Medicine

## 2019-02-13 DIAGNOSIS — I517 Cardiomegaly: Secondary | ICD-10-CM | POA: Diagnosis not present

## 2019-02-13 DIAGNOSIS — R609 Edema, unspecified: Secondary | ICD-10-CM | POA: Insufficient documentation

## 2019-02-13 NOTE — Progress Notes (Signed)
  Echocardiogram 2D Echocardiogram has been performed.  Burnett Kanaris 02/13/2019, 3:42 PM

## 2019-02-28 ENCOUNTER — Other Ambulatory Visit: Payer: Self-pay

## 2019-02-28 ENCOUNTER — Emergency Department (HOSPITAL_COMMUNITY): Payer: Medicaid Other

## 2019-02-28 ENCOUNTER — Emergency Department (HOSPITAL_COMMUNITY)
Admission: EM | Admit: 2019-02-28 | Discharge: 2019-02-28 | Disposition: A | Discharge status: Home / Self Care | Payer: Medicaid Other | Attending: Emergency Medicine | Admitting: Emergency Medicine

## 2019-02-28 DIAGNOSIS — Z79899 Other long term (current) drug therapy: Secondary | ICD-10-CM | POA: Diagnosis not present

## 2019-02-28 DIAGNOSIS — R0789 Other chest pain: Secondary | ICD-10-CM | POA: Insufficient documentation

## 2019-02-28 DIAGNOSIS — Z91040 Latex allergy status: Secondary | ICD-10-CM | POA: Diagnosis not present

## 2019-02-28 LAB — BASIC METABOLIC PANEL
Anion gap: 11 (ref 5–15)
BUN: 7 mg/dL (ref 6–20)
CO2: 24 mmol/L (ref 22–32)
Calcium: 9 mg/dL (ref 8.9–10.3)
Chloride: 104 mmol/L (ref 98–111)
Creatinine, Ser: 0.8 mg/dL (ref 0.44–1.00)
GFR calc Af Amer: >60 mL/min (ref >60)
GFR calc non Af Amer: >60 mL/min (ref >60)
Glucose, Bld: 94 mg/dL (ref 70–99)
Potassium: 3.7 mmol/L (ref 3.5–5.1)
Sodium: 139 mmol/L (ref 135–145)

## 2019-02-28 LAB — CBC
HCT: 39.3 % (ref 36.0–46.0)
Hemoglobin: 11.4 g/dL — ABNORMAL LOW (ref 12.0–15.0)
MCH: 19 pg — ABNORMAL LOW (ref 26.0–34.0)
MCHC: 29 g/dL — ABNORMAL LOW (ref 30.0–36.0)
MCV: 65.4 fL — ABNORMAL LOW (ref 80.0–100.0)
Platelets: 296 10*3/uL (ref 150–400)
RBC: 6.01 MIL/uL — ABNORMAL HIGH (ref 3.87–5.11)
RDW: 21.3 % — ABNORMAL HIGH (ref 11.5–15.5)
WBC: 9.7 10*3/uL (ref 4.0–10.5)
nRBC: 0 % (ref 0.0–0.2)

## 2019-02-28 LAB — I-STAT BETA HCG BLOOD, ED (MC, WL, AP ONLY): I-stat hCG, quantitative: <5 m[IU]/mL (ref <5)

## 2019-02-28 LAB — TROPONIN I: Troponin I: <0.03 ng/mL (ref <0.03)

## 2019-02-28 MED ORDER — SODIUM CHLORIDE 0.9% FLUSH: 3.0000 mL | Freq: Once | INTRAVENOUS | Status: DC

## 2019-02-28 NOTE — ED Provider Notes (Signed)
[facility]ERGENC[facility] DEPARTMENT Provider Note   CSN: [id number] Arrival date & time: 02/28/19  1816    History   Chief Complaint Chief Complaint  Patient presents with  . Chest Pain    HPI Brenda [name] is a 33 y.o. female.     33yo F w/ PMH including asthma, anemia who p/w chest pain. Pt was recently evaluated for enlarged heart w/ outpatient ECHO this week. She states that 2 days ago when she got upset, she began having chest pain and it has never gone away since. CP is central, moderate in intensity, constant, non-exertional and feels better after she moves around. No SOB, N/V, diaphoresis, cough/cold symptoms, or recent illness. No leg swelling/pain, recent travel, h/o blood clots, or h/o cancer.   The history is provided by the patient.  Chest Pain   Past Medical History:  Diagnosis Date  . Abnormal Pap smear   . Anemia   . Asthma    as a child, "outgrew it"    Patient Active Problem List   Diagnosis Date Noted  . Cardiomegaly 12/09/2018  . Pelvic lymphadenopathy 12/09/2018  . Sepsis ([place]) 12/06/2018  . Cellulitis of right lower extremity 12/06/2018  . Asthma 12/06/2018  . Nausea vomiting and diarrhea 12/06/2018  . Menorrhagia 05/29/2017  . Irregular periods/menstrual cycles 05/29/2017  . Abnormal Pap smear--ASCUS with HRHPV 02/2017 05/29/2017  . 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 Latex allergy 05/29/2017  . History of 2 cesarean sections 01/29/2015  . Anemia 09/01/2012    Past Surgical History:  Procedure Laterality Date  . CERVICAL CERCLAGE N/A 10/14/2014   Procedure: CERCLAGE CERVICAL;  Surgeon: Delice Lesch, MD;  Location: [facility];  Service: Gynecology;  Laterality: N/A;  . CESAREAN SECTION     2010  . CESAREAN SECTION N/A 01/29/2015   Procedure: CESAREAN SECTION;  Surgeon: Everett Graff, MD;  Location: [facility];  Service: Obstetrics;  Laterality: N/A;  . FACIAL RECONSTRUCTION SURGERY       OB History    Gravida  3   Para  2    Term  1   Preterm  1   AB  1   Living  2     SAB  1   TAB      Ectopic      Multiple  0   Live Births  2            Home Medications    Prior to Admission medications   Medication Sig Start Date End Date Taking? Authorizing Provider  acetaminophen (TYLENOL) 325 MG tablet Take 650 mg by mouth every 6 (six) hours as needed for mild pain or fever.    [provider]  albuterol (PROVENTIL HFA;VENTOLIN HFA) 108 (90 Base) MCG/ACT inhaler Inhale 1-2 puffs into the lungs every 6 (six) hours as needed for wheezing or shortness of breath. 11/28/18   Tacy Learn, PA-C  cefdinir (OMNICEF) 300 MG capsule Take 1 capsule (300 mg total) by mouth 2 (two) times daily. 12/08/18   Norval Morton, MD  ferrous sulfate 325 (65 FE) MG tablet Take 1 tablet (325 mg total) by mouth daily with breakfast. 12/08/18   Norval Morton, MD  furosemide (LASIX) 20 MG tablet Take 1 tablet (20 mg total) by mouth daily as needed for fluid. 12/07/18   Norval Morton, MD  vitamin C (VITAMIN C) 500 MG tablet Take 1 tablet (500 mg total) by mouth daily with breakfast. 12/07/18  Norval Morton, MD    Family History Family History  Problem Relation Age of Onset  . Stroke Mother   . Diabetes Mother   . Hypertension Mother   . Coronary artery disease Mother   . Hypertension Father     Social History Social History   Tobacco Use  . Smoking status: Never Smoker  . Smokeless tobacco: Never Used  Substance Use Topics  . Alcohol use: Yes    Comment: occasionally  . Drug use: No     Allergies   Oxycodone hcl, Latex, and Terconazole   Review of Systems Review of Systems  Cardiovascular: Positive for chest pain.  All other systems reviewed and are negative except that which was mentioned in HPI    Physical Exam Updated Vital Signs BP 127/70   Pulse 72   Temp 98.7 F (37.1 C) (Oral)   Resp 20   SpO2 100%   Physical Exam Vitals signs and nursing note reviewed.   Constitutional:      General: She is not in acute distress.    Appearance: She is well-developed.  HENT:     Head: Normocephalic and atraumatic.  Eyes:     Conjunctiva/sclera: Conjunctivae normal.  Neck:     Musculoskeletal: Neck supple.  Cardiovascular:     Rate and Rhythm: Normal rate and regular rhythm.     Heart sounds: Normal heart sounds. No murmur.  Pulmonary:     Effort: Pulmonary effort is normal.     Breath sounds: Normal breath sounds.  Abdominal:     General: Bowel sounds are normal. There is no distension.     Palpations: Abdomen is soft.     Tenderness: There is no abdominal tenderness.  Skin:    General: Skin is warm and dry.  Neurological:     Mental Status: She is alert and oriented to person, place, and time.     Comments: Fluent speech  Psychiatric:        Judgment: Judgment normal.      ED Treatments / Results  Labs (all labs ordered are listed, but only abnormal results are displayed) Labs Reviewed  CBC - Abnormal; Notable for the following components:      Result Value   RBC 6.01 (*)    Hemoglobin 11.4 (*)    MCV 65.4 (*)    MCH 19.0 (*)    MCHC 29.0 (*)    RDW 21.3 (*)    All other components within normal limits  BASIC METABOLIC PANEL  TROPONIN I  I-STAT BETA HCG BLOOD, ED ([facility], [facility], [facility] ONLY)    EKG EKG Interpretation  Date/Time:  Friday February 28 2019 18:24:43 EDT Ventricular Rate:  89 PR Interval:  156 QRS Duration: 92 QT Interval:  366 QTC Calculation: 445 R Axis:   120 Text Interpretation:  Normal sinus rhythm Right axis deviation Incomplete right bundle branch block Abnormal ECG overall similar to previous Confirmed by Theotis Burrow [phone]) on 02/28/2019 8:17:06 PM   Radiology Dg Chest 2 View  Result Date: 02/28/2019 CLINICAL DATA:  Per pt she has been having chest pain x 2 days. Was seen by cardiovascular a few weeks ago dx with enlarged heart. Pt said she got upset 2 days ago and chest pain started and has not stopped. No  SOB. Pain was in center and shoots through to her back. EXAM: CHEST - 2 VIEW COMPARISON:  12/06/2018 FINDINGS: Mild enlargement of the cardiopericardial silhouette, stable. Normal mediastinal and hilar contours. Clear lungs.  No pleural effusion or pneumothorax. Skeletal structures are unremarkable. IMPRESSION: 1. No active cardiopulmonary disease. 2. Mild stable cardiomegaly. Electronically Signed   By: Lajean Manes M.D.   On: 02/28/2019 19:02    Procedures Procedures (including critical care time)  Medications Ordered in ED Medications  sodium chloride flush (NS) 0.9 % injection 3 mL (3 mLs Intravenous Not Given 02/28/19 2139)     Initial Impression / Assessment and Plan / ED Course  I have reviewed the triage vital signs and the nursing notes.  Pertinent labs & imaging results that were available during my care of the patient were reviewed by me and considered in my medical decision making (see chart for details).       Comfortable on exam, states that moving certain ways relieves pain. EKG without ischemic changes. Reviewed ECHO results which were reassuring. Basic labs reassuring, trop negative and chest pain has been constant therefore single trop is sufficient. No risk factors for PE and PERC negative. Given atypical description, I feel she is safe for d/c with PCP f/u. I have reviewed return precautions and she voiced understanding.     Final Clinical Impressions(s) / ED Diagnoses   Final diagnoses:  Atypical chest pain    ED Discharge Orders    None       Little, Wenda Overland, MD 03/01/19 [phone]

## 2019-02-28 NOTE — ED Triage Notes (Signed)
Per pt she has been having chest pain x 2 days. Was seen by cardiovascular a few weeks ago dx with enlarged heart. Pt said she got upset 2 days ago and chest p[ain started and has not stopped. No SOB. Pain was in center and shoots through to her back.

## 2019-02-28 NOTE — ED Notes (Signed)
Patient verbalizes understanding of discharge instructions. Opportunity for questioning and answers were provided. Armband removed by staff, pt discharged from ED ambulatory to home.  

## 2019-10-01 ENCOUNTER — Other Ambulatory Visit: Payer: Self-pay

## 2019-10-01 ENCOUNTER — Encounter (HOSPITAL_COMMUNITY): Payer: Self-pay | Admitting: Emergency Medicine

## 2019-10-01 ENCOUNTER — Emergency Department (HOSPITAL_COMMUNITY)
Admission: EM | Admit: 2019-10-01 | Discharge: 2019-10-01 | Disposition: A | Discharge status: Home / Self Care | Payer: Medicaid Other | Attending: Emergency Medicine | Admitting: Emergency Medicine

## 2019-10-01 DIAGNOSIS — D508 Other iron deficiency anemias: Secondary | ICD-10-CM | POA: Insufficient documentation

## 2019-10-01 DIAGNOSIS — Z91040 Latex allergy status: Secondary | ICD-10-CM | POA: Diagnosis not present

## 2019-10-01 DIAGNOSIS — J45909 Unspecified asthma, uncomplicated: Secondary | ICD-10-CM | POA: Diagnosis not present

## 2019-10-01 DIAGNOSIS — R42 Dizziness and giddiness: Secondary | ICD-10-CM | POA: Diagnosis present

## 2019-10-01 DIAGNOSIS — Z79899 Other long term (current) drug therapy: Secondary | ICD-10-CM | POA: Diagnosis not present

## 2019-10-01 LAB — URINALYSIS, MICROSCOPIC (REFLEX): RBC / HPF: >50 RBC/hpf (ref 0–5)

## 2019-10-01 LAB — CBC
HCT: 27.3 % — ABNORMAL LOW (ref 36.0–46.0)
Hemoglobin: 7.6 g/dL — ABNORMAL LOW (ref 12.0–15.0)
MCH: 17.4 pg — ABNORMAL LOW (ref 26.0–34.0)
MCHC: 27.8 g/dL — ABNORMAL LOW (ref 30.0–36.0)
MCV: 62.6 fL — ABNORMAL LOW (ref 80.0–100.0)
Platelets: 396 10*3/uL (ref 150–400)
RBC: 4.36 MIL/uL (ref 3.87–5.11)
RDW: 20.5 % — ABNORMAL HIGH (ref 11.5–15.5)
WBC: 8 10*3/uL (ref 4.0–10.5)
nRBC: 0.2 % (ref 0.0–0.2)

## 2019-10-01 LAB — URINALYSIS, ROUTINE W REFLEX MICROSCOPIC

## 2019-10-01 LAB — CBG MONITORING, ED: Glucose-Capillary: 104 mg/dL — ABNORMAL HIGH (ref 70–99)

## 2019-10-01 LAB — BASIC METABOLIC PANEL
Anion gap: 12 (ref 5–15)
BUN: 11 mg/dL (ref 6–20)
CO2: 22 mmol/L (ref 22–32)
Calcium: 9.2 mg/dL (ref 8.9–10.3)
Chloride: 104 mmol/L (ref 98–111)
Creatinine, Ser: 0.85 mg/dL (ref 0.44–1.00)
GFR calc Af Amer: >60 mL/min (ref >60)
GFR calc non Af Amer: >60 mL/min (ref >60)
Glucose, Bld: 113 mg/dL — ABNORMAL HIGH (ref 70–99)
Potassium: 3.9 mmol/L (ref 3.5–5.1)
Sodium: 138 mmol/L (ref 135–145)

## 2019-10-01 LAB — I-STAT BETA HCG BLOOD, ED (MC, WL, AP ONLY): I-stat hCG, quantitative: <5 m[IU]/mL (ref <5)

## 2019-10-01 MED ORDER — SODIUM CHLORIDE 0.9% FLUSH: 3.0000 mL | Freq: Once | INTRAVENOUS | Status: DC

## 2019-10-01 NOTE — ED Notes (Signed)
Pt ambulated to BR without difficulty or need for (A).  States hx of similar sx with monthly cycle and has needed transfusions- 4 to 5.   Currently at the end of her cycle.

## 2019-10-01 NOTE — ED Provider Notes (Signed)
[facility] EMERGENCY DEPARTMENT Provider Note   CSN: [id number] Arrival date & time: 10/01/19  1030     History Chief Complaint  Patient presents with  . Dizziness    Brenda [name] is a 34 y.o. female with a past medical history of iron deficiency anemia requiring transfusions in the past presenting to the ED with a chief complaint of dizziness.  Was at work moving 2 pound blocks while standing up and all of a sudden felt dizzy and lightheaded.  Symptoms resolved after about 2 hours after sitting down, drinking orange juice and eating a muffin.  She does admit that she did not eat breakfast this morning.  She has been taking her iron supplements "sometimes but not always."  States that she felt similarly in the past when she required blood transfusions.  She is at the end of her menstrual cycle and does admit to heavy menstrual cycles in general.  She had a blood pressure checked at work when she became dizzy and was found to be 0000000 systolic.  States she overall feels better.  Denies any chest pain, headache, blurry vision, numbness in arms or legs, vomiting, diarrhea, abdominal pain, anticoagulant use, head injuries.  HPI     Past Medical History:  Diagnosis Date  . Abnormal Pap smear   . Anemia   . Asthma    as a child, "outgrew it"    Patient Active Problem List   Diagnosis Date Noted  . Cardiomegaly 12/09/2018  . Pelvic lymphadenopathy 12/09/2018  . Sepsis ([place]) 12/06/2018  . Cellulitis of right lower extremity 12/06/2018  . Asthma 12/06/2018  . Nausea vomiting and diarrhea 12/06/2018  . Menorrhagia 05/29/2017  . Irregular periods/menstrual cycles 05/29/2017  . Abnormal Pap smear--ASCUS with HRHPV 02/2017 05/29/2017  . 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 Latex allergy 05/29/2017  . History of 2 cesarean sections 01/29/2015  . Anemia 09/01/2012    Past Surgical History:  Procedure Laterality Date  . CERVICAL CERCLAGE N/A 10/14/2014   Procedure:  CERCLAGE CERVICAL;  Surgeon: Delice Lesch, MD;  Location: [facility];  Service: Gynecology;  Laterality: N/A;  . CESAREAN SECTION     2010  . CESAREAN SECTION N/A 01/29/2015   Procedure: CESAREAN SECTION;  Surgeon: Everett Graff, MD;  Location: [facility];  Service: Obstetrics;  Laterality: N/A;  . FACIAL RECONSTRUCTION SURGERY       OB History    Gravida  3   Para  2   Term  1   Preterm  1   AB  1   Living  2     SAB  1   TAB      Ectopic      Multiple  0   Live Births  2           Family History  Problem Relation Age of Onset  . Stroke Mother   . Diabetes Mother   . Hypertension Mother   . Coronary artery disease Mother   . Hypertension Father     Social History   Tobacco Use  . Smoking status: Never Smoker  . Smokeless tobacco: Never Used  Substance Use Topics  . Alcohol use: Yes    Comment: occasionally  . Drug use: No    Home Medications Prior to Admission medications   Medication Sig Start Date End Date Taking? Authorizing Provider  albuterol (PROVENTIL HFA;VENTOLIN HFA) 108 (90 Base) MCG/ACT inhaler Inhale 1-2 puffs into the lungs every 6 (six) hours  as needed for wheezing or shortness of breath. 11/28/18  Yes Tacy Learn, PA-C  Cholecalciferol (VITAMIN D3) 1.25 MG (50000 UT) CAPS Take 1 capsule by mouth once a week. 09/08/19  Yes [provider]  ferrous sulfate 325 (65 FE) MG tablet Take 1-2 tablets by mouth daily.    Yes [provider]  Multiple Vitamin (MULTIVITAMIN WITH MINERALS) TABS tablet Take 1 tablet by mouth daily.   Yes [provider]  norethindrone (AYGESTIN) 5 MG tablet Take 10 mg by mouth daily. 09/08/19  Yes [provider]  furosemide (LASIX) 20 MG tablet Take 1 tablet (20 mg total) by mouth daily as needed for fluid. 12/07/18   Norval Morton, MD    Allergies    Oxycodone hcl, Latex, and Terconazole  Review of Systems   Review of Systems  Constitutional: Negative for appetite  change, chills and fever.  HENT: Negative for ear pain, rhinorrhea, sneezing and sore throat.   Eyes: Negative for photophobia and visual disturbance.  Respiratory: Negative for cough, chest tightness, shortness of breath and wheezing.   Cardiovascular: Negative for chest pain and palpitations.  Gastrointestinal: Negative for abdominal pain, blood in stool, constipation, diarrhea, nausea and vomiting.  Genitourinary: Negative for dysuria, hematuria and urgency.  Musculoskeletal: Negative for myalgias.  Skin: Negative for rash.  Neurological: Positive for dizziness and light-headedness. Negative for weakness.    Physical Exam Updated Vital Signs BP 139/60 (BP Location: Right Arm)   Pulse 86   Temp 98.2 F (36.8 C) (Oral)   Resp 18   Ht 5\' 9"  (1.753 m)   Wt (!) 137.4 kg   SpO2 100%   BMI 44.75 kg/m   Physical Exam Vitals and nursing note reviewed.  Constitutional:      General: She is not in acute distress.    Appearance: She is well-developed. She is obese.  HENT:     Head: Normocephalic and atraumatic.     Nose: Nose normal.  Eyes:     General: No scleral icterus.       Left eye: No discharge.     Conjunctiva/sclera: Conjunctivae normal.  Cardiovascular:     Rate and Rhythm: Normal rate and regular rhythm.     Heart sounds: Normal heart sounds. No murmur. No friction rub. No gallop.   Pulmonary:     Effort: Pulmonary effort is normal. No respiratory distress.     Breath sounds: Normal breath sounds. No rhonchi.  Abdominal:     General: Bowel sounds are normal. There is no distension.     Palpations: Abdomen is soft.     Tenderness: There is no abdominal tenderness. There is no guarding.  Musculoskeletal:        General: Normal range of motion.     Cervical back: Normal range of motion and neck supple.  Skin:    General: Skin is warm and dry.     Findings: No rash.  Neurological:     General: No focal deficit present.     Mental Status: She is alert and oriented  to person, place, and time.     Cranial Nerves: No cranial nerve deficit.     Sensory: No sensory deficit.     Motor: No weakness or abnormal muscle tone.     Coordination: Coordination normal.     Comments: Pupils reactive. No facial asymmetry noted. Cranial nerves appear grossly intact. Sensation intact to light touch on face, BUE and BLE. Strength 5/5 in BUE and BLE.  ED Results / Procedures / Treatments   Labs (all labs ordered are listed, but only abnormal results are displayed) Labs Reviewed  BASIC METABOLIC PANEL - Abnormal; Notable for the following components:      Result Value   Glucose, Bld 113 (*)    All other components within normal limits  CBC - Abnormal; Notable for the following components:   Hemoglobin 7.6 (*)    HCT 27.3 (*)    MCV 62.6 (*)    MCH 17.4 (*)    MCHC 27.8 (*)    RDW 20.5 (*)    All other components within normal limits  URINALYSIS, ROUTINE W REFLEX MICROSCOPIC - Abnormal; Notable for the following components:   Color, Urine RED (*)    APPearance TURBID (*)    Glucose, UA   (*)    Value: TEST NOT REPORTED DUE TO COLOR INTERFERENCE OF URINE PIGMENT   Hgb urine dipstick   (*)    Value: TEST NOT REPORTED DUE TO COLOR INTERFERENCE OF URINE PIGMENT   Bilirubin Urine   (*)    Value: TEST NOT REPORTED DUE TO COLOR INTERFERENCE OF URINE PIGMENT   Ketones, ur   (*)    Value: TEST NOT REPORTED DUE TO COLOR INTERFERENCE OF URINE PIGMENT   Protein, ur   (*)    Value: TEST NOT REPORTED DUE TO COLOR INTERFERENCE OF URINE PIGMENT   Nitrite   (*)    Value: TEST NOT REPORTED DUE TO COLOR INTERFERENCE OF URINE PIGMENT   Leukocytes,Ua   (*)    Value: TEST NOT REPORTED DUE TO COLOR INTERFERENCE OF URINE PIGMENT   All other components within normal limits  URINALYSIS, MICROSCOPIC (REFLEX) - Abnormal; Notable for the following components:   Bacteria, UA MANY (*)    All other components within normal limits  CBG MONITORING, ED - Abnormal; Notable for the  following components:   Glucose-Capillary 104 (*)    All other components within normal limits  I-STAT BETA HCG BLOOD, ED ([facility], [facility], [facility] ONLY)    EKG EKG Interpretation  Date/Time:  Wednesday October 01 2019 10:35:48 EST Ventricular Rate:  87 PR Interval:  154 QRS Duration: 86 QT Interval:  366 QTC Calculation: 440 R Axis:   113 Text Interpretation: Normal sinus rhythm Right axis deviation Abnormal ECG Confirmed by Lennice Sites [phone]) on 10/01/2019 12:55:49 PM   Radiology No results found.  Procedures Procedures (including critical care time)  Medications Ordered in ED Medications  sodium chloride flush (NS) 0.9 % injection 3 mL (has no administration in time range)    ED Course  I have reviewed the triage vital signs and the nursing notes.  Pertinent labs & imaging results that were available during my care of the patient were reviewed by me and considered in my medical decision making (see chart for details).    MDM Rules/Calculators/A&P                      34 year old female presents to the ED for a near syncopal episode that occurred prior to arrival.  Reports feeling like she was anemic as similar to the past and required transfusions.  She does admit that she has not been compliant with her iron as she sometimes forgets due to her busy schedule.  Denies any loss of consciousness, headache, chest pain or shortness of breath.  States her symptoms have resolved after eating and drinking at work.  On exam she is overall well-appearing.  She is not tachycardic, tachypneic or hypoxic.  Vital signs otherwise within normal limits as well.  No deficits neurological exam noted.  EKG shows normal sinus rhythm.  She is currently at the end of her menstrual cycle.  Lab work significant for hemoglobin of 7.6 which is slightly decreased from her baseline.  I had a discussion with the patient regarding her hemoglobin level today as well as her symptoms.  It is most likely the source of  her near syncopal episode but she remains hemodynamically stable and her symptoms have resolved.  She is in agreement with deferring blood transfusion at this time as she states that "sometimes that makes me feel worse."  She was encouraged to take her iron daily as prescribed and to follow-up with her PCP for repeat hemoglobin check, as this is likely from her heavy menstrual bleeding as noted in the past as well.  States that she will try her best and knows to return for worsening symptoms.  Patient is hemodynamically stable, in NAD, and able to ambulate in the ED. Evaluation does not show pathology that would require ongoing emergent intervention or inpatient treatment. I explained the diagnosis to the patient. Pain has been managed and has no complaints prior to discharge. Patient is comfortable with above plan and is stable for discharge at this time. All questions were answered prior to disposition. Strict return precautions for returning to the ED were discussed. Encouraged follow up with PCP.   An After Visit Summary was printed and given to the patient.   Portions of this note were generated with [employer]. Dictation errors may occur despite best attempts at proofreading.  Final Clinical Impression(s) / ED Diagnoses Final diagnoses:  Other iron deficiency anemia    Rx / DC Orders ED Discharge Orders    None       Delia Heady, PA-C 10/01/19 1339    Lennice Sites, DO 10/01/19 1610

## 2019-10-01 NOTE — ED Triage Notes (Addendum)
Pt reports she became dizzy and lightheaded while at work, had her bp checked and it was found to be in the 160s-170s, hx of anemia requiring blood transfusions in the past and reports she felt similar then. A/ox4, resp e/u, no neuro symptoms.

## 2019-10-01 NOTE — Discharge Instructions (Addendum)
It is important for you to take your iron as prescribed. Follow-up with your primary care provider to have your hemoglobin level rechecked this week. Return to the ED if you start to develop worsening symptoms, loss of consciousness, worsening dizziness, shortness of breath or chest pain.

## 2019-10-20 ENCOUNTER — Other Ambulatory Visit: Payer: Self-pay | Admitting: Obstetrics and Gynecology

## 2019-12-08 ENCOUNTER — Other Ambulatory Visit: Payer: Self-pay | Admitting: Obstetrics and Gynecology

## 2019-12-09 ENCOUNTER — Encounter: Payer: Self-pay | Admitting: Nurse Practitioner

## 2019-12-09 ENCOUNTER — Telehealth (HOSPITAL_COMMUNITY): Payer: Self-pay | Admitting: Nurse Practitioner

## 2019-12-09 NOTE — Telephone Encounter (Signed)
Called to Discuss with patient about Covid symptoms and the use of bamlanivimab, a monoclonal antibody infusion for those with mild to moderate Covid symptoms and at a high risk of hospitalization.     Pt is qualified for this infusion at the [facility] infusion center due to co-morbid conditions and/or a member of an at-risk group.     Spoke to patient but unfortunately we were disconnected. I called back and left message. Symptoms started on 12/08/19. She was tested at [facility] today and received positive test. Based on BMI, she would qualify for infusion. Patient's sister is scheduled to received AMB on 3/24.   Beckey Rutter, [place], AGNP-C [phone] ([place])

## 2019-12-10 ENCOUNTER — Telehealth: Payer: Self-pay | Admitting: Unknown Physician Specialty

## 2019-12-10 ENCOUNTER — Ambulatory Visit (HOSPITAL_COMMUNITY)
Admission: RE | Admit: 2019-12-10 | Discharge: 2019-12-10 | Disposition: A | Discharge status: Home / Self Care | Payer: Medicaid Other | Source: Ambulatory Visit | Attending: Pulmonary Disease | Admitting: Pulmonary Disease

## 2019-12-10 ENCOUNTER — Other Ambulatory Visit: Payer: Self-pay | Admitting: Unknown Physician Specialty

## 2019-12-10 DIAGNOSIS — U071 COVID-19: Secondary | ICD-10-CM

## 2019-12-10 MED ORDER — DIPHENHYDRAMINE HCL 50 MG/ML IJ SOLN: 50.0000 mg | Freq: Once | INTRAMUSCULAR | Status: DC | PRN

## 2019-12-10 MED ORDER — SODIUM CHLORIDE 0.9 % IV SOLN
INTRAVENOUS | Status: DC | PRN
  Administered 2019-12-10: 13:00:00 250 mL via INTRAVENOUS

## 2019-12-10 MED ORDER — METHYLPREDNISOLONE SODIUM SUCC 125 MG IJ SOLR: 125.0000 mg | Freq: Once | INTRAMUSCULAR | Status: DC | PRN

## 2019-12-10 MED ORDER — ALBUTEROL SULFATE HFA 108 (90 BASE) MCG/ACT IN AERS: 2.0000 | INHALATION_SPRAY | Freq: Once | RESPIRATORY_TRACT | Status: DC | PRN

## 2019-12-10 MED ORDER — EPINEPHRINE 0.3 MG/0.3ML IJ SOAJ: 0.3000 mg | Freq: Once | INTRAMUSCULAR | Status: DC | PRN

## 2019-12-10 MED ORDER — FAMOTIDINE IN NACL 20-0.9 MG/50ML-% IV SOLN: 20.0000 mg | Freq: Once | INTRAVENOUS | Status: DC | PRN

## 2019-12-10 MED ORDER — SODIUM CHLORIDE 0.9 % IV SOLN
700.0000 mg | Freq: Once | INTRAVENOUS | Status: AC
  Administered 2019-12-10: 13:00:00 700 mg via INTRAVENOUS
  Filled 2019-12-10: qty 700

## 2019-12-10 NOTE — Progress Notes (Addendum)
  Diagnosis: COVID-19  Physician: Dr Joya Gaskins  Procedure: Covid Infusion Clinic Med: bamlanivimab infusion - Provided patient with bamlanimivab fact sheet for patients, parents and caregivers prior to infusion.  Complications: No immediate complications noted.  Discharge: Discharged home   [place], [place] 12/10/2019

## 2019-12-10 NOTE — Telephone Encounter (Signed)
  I connected by phone with Marvel Plan on 12/10/2019 at 8:40 AM to discuss the potential use of an new treatment for mild to moderate COVID-19 viral infection in non-hospitalized patients.  This patient is a 34 y.o. female that meets the FDA criteria for Emergency Use Authorization of bamlanivimab or casirivimab\imdevimab.  Has a (+) direct SARS-CoV-2 viral test result  Has mild or moderate COVID-19   Is ? 36 years of age and weighs ? 40 kg  Is NOT hospitalized due to COVID-19  Is NOT requiring oxygen therapy or requiring an increase in baseline oxygen flow rate due to COVID-19  Is within 10 days of symptom onset  Has at least one of the high risk factor(s) for progression to severe COVID-19 and/or hospitalization as defined in EUA.  Specific high risk criteria : BMI >/= 35   I have spoken and communicated the following to the patient or parent/caregiver:  1. FDA has authorized the emergency use of bamlanivimab and casirivimab\imdevimab for the treatment of mild to moderate COVID-19 in adults and pediatric patients with positive results of direct SARS-CoV-2 viral testing who are 68 years of age and older weighing at least 40 kg, and who are at high risk for progressing to severe COVID-19 and/or hospitalization.  2. The significant known and potential risks and benefits of bamlanivimab and casirivimab\imdevimab, and the extent to which such potential risks and benefits are unknown.  3. Information on available alternative treatments and the risks and benefits of those alternatives, including clinical trials.  4. Patients treated with bamlanivimab and casirivimab\imdevimab should continue to self-isolate and use infection control measures (e.g., wear mask, isolate, social distance, avoid sharing personal items, clean and disinfect "high touch" surfaces, and frequent handwashing) according to CDC guidelines.   5. The patient or parent/caregiver has the option to accept or refuse  bamlanivimab or casirivimab\imdevimab .  After reviewing this information with the patient, The patient agreed to proceed with receiving the bamlanimivab infusion and will be provided a copy of the Fact sheet prior to receiving the infusion. Kathrine Haddock 12/10/2019 8:40 AM

## 2019-12-10 NOTE — Discharge Instructions (Signed)

## 2019-12-23 ENCOUNTER — Encounter (HOSPITAL_BASED_OUTPATIENT_CLINIC_OR_DEPARTMENT_OTHER): Payer: Self-pay | Admitting: Obstetrics and Gynecology

## 2019-12-23 ENCOUNTER — Other Ambulatory Visit: Payer: Self-pay

## 2019-12-27 ENCOUNTER — Other Ambulatory Visit (HOSPITAL_COMMUNITY): Payer: Medicaid Other

## 2019-12-30 ENCOUNTER — Encounter (HOSPITAL_BASED_OUTPATIENT_CLINIC_OR_DEPARTMENT_OTHER): Payer: Self-pay | Admitting: Obstetrics and Gynecology

## 2019-12-30 NOTE — Progress Notes (Signed)
Spoke with patient about her surgery date being changed to 01/06/20,arrival time at 1030. Will need to do Covid testing no later than 01/03/2020 from 9-12 at [facility]. She will then need to quarantine, with the exception of coming to [facility] to do pre op testing as ordered. Patient verbalized understanding.

## 2019-12-31 ENCOUNTER — Encounter (HOSPITAL_BASED_OUTPATIENT_CLINIC_OR_DEPARTMENT_OTHER)
Admission: RE | Admit: 2019-12-31 | Discharge: 2019-12-31 | Disposition: A | Discharge status: Home / Self Care | Payer: Medicaid Other | Source: Ambulatory Visit | Attending: Obstetrics and Gynecology | Admitting: Obstetrics and Gynecology

## 2019-12-31 DIAGNOSIS — Z01812 Encounter for preprocedural laboratory examination: Secondary | ICD-10-CM | POA: Diagnosis present

## 2019-12-31 LAB — POCT PREGNANCY, URINE: Preg Test, Ur: NEGATIVE

## 2019-12-31 LAB — BASIC METABOLIC PANEL
Anion gap: 10 (ref 5–15)
BUN: 9 mg/dL (ref 6–20)
CO2: 24 mmol/L (ref 22–32)
Calcium: 9.2 mg/dL (ref 8.9–10.3)
Chloride: 105 mmol/L (ref 98–111)
Creatinine, Ser: 0.72 mg/dL (ref 0.44–1.00)
GFR calc Af Amer: >60 mL/min (ref >60)
GFR calc non Af Amer: >60 mL/min (ref >60)
Glucose, Bld: 91 mg/dL (ref 70–99)
Potassium: 4.1 mmol/L (ref 3.5–5.1)
Sodium: 139 mmol/L (ref 135–145)

## 2019-12-31 LAB — CBC
HCT: 32.2 % — ABNORMAL LOW (ref 36.0–46.0)
Hemoglobin: 9 g/dL — ABNORMAL LOW (ref 12.0–15.0)
MCH: 16.6 pg — ABNORMAL LOW (ref 26.0–34.0)
MCHC: 28 g/dL — ABNORMAL LOW (ref 30.0–36.0)
MCV: 59.5 fL — ABNORMAL LOW (ref 80.0–100.0)
Platelets: 414 10*3/uL — ABNORMAL HIGH (ref 150–400)
RBC: 5.41 MIL/uL — ABNORMAL HIGH (ref 3.87–5.11)
RDW: 26.8 % — ABNORMAL HIGH (ref 11.5–15.5)
WBC: 10.9 10*3/uL — ABNORMAL HIGH (ref 4.0–10.5)
nRBC: 0 % (ref 0.0–0.2)

## 2020-01-01 NOTE — Progress Notes (Addendum)
Dr. Mancel Bale notified about hemoglobin-9.0, will obtain type and screen on Monday, 4/19. Notified pt and she verbalized understanding.  Anesthesia ( Dr. Sabra Heck) aware.

## 2020-01-02 ENCOUNTER — Inpatient Hospital Stay (HOSPITAL_COMMUNITY): Admission: RE | Admit: 2020-01-02 | Payer: Medicaid Other | Source: Ambulatory Visit

## 2020-01-06 ENCOUNTER — Encounter (HOSPITAL_BASED_OUTPATIENT_CLINIC_OR_DEPARTMENT_OTHER)
Admission: RE | Disposition: A | Discharge status: Home / Self Care | Payer: Self-pay | Source: Home / Self Care | Attending: Obstetrics and Gynecology

## 2020-01-06 ENCOUNTER — Encounter (HOSPITAL_BASED_OUTPATIENT_CLINIC_OR_DEPARTMENT_OTHER): Payer: Self-pay | Admitting: Obstetrics and Gynecology

## 2020-01-06 ENCOUNTER — Ambulatory Visit (HOSPITAL_BASED_OUTPATIENT_CLINIC_OR_DEPARTMENT_OTHER)
Admission: RE | Admit: 2020-01-06 | Discharge: 2020-01-06 | Disposition: A | Discharge status: Home / Self Care | Payer: Medicaid Other | Attending: Obstetrics and Gynecology | Admitting: Obstetrics and Gynecology

## 2020-01-06 DIAGNOSIS — N870 Mild cervical dysplasia: Secondary | ICD-10-CM | POA: Diagnosis not present

## 2020-01-06 DIAGNOSIS — J45909 Unspecified asthma, uncomplicated: Secondary | ICD-10-CM | POA: Diagnosis not present

## 2020-01-06 DIAGNOSIS — N920 Excessive and frequent menstruation with regular cycle: Secondary | ICD-10-CM | POA: Insufficient documentation

## 2020-01-06 DIAGNOSIS — Z5329 Procedure and treatment not carried out because of patient's decision for other reasons: Secondary | ICD-10-CM | POA: Insufficient documentation

## 2020-01-06 SURGERY — HYSTEROSCOPY, USING RESECTOSCOPE
Anesthesia: Choice

## 2020-01-06 MED ORDER — MIDAZOLAM HCL 2 MG/2ML IJ SOLN: 1.0000 mg | INTRAMUSCULAR | Status: DC | PRN

## 2020-01-06 MED ORDER — LACTATED RINGERS IV SOLN: INTRAVENOUS | Status: DC

## 2020-01-06 MED ORDER — FENTANYL CITRATE (PF) 100 MCG/2ML IJ SOLN: 50.0000 ug | INTRAMUSCULAR | Status: DC | PRN

## 2020-01-06 NOTE — Progress Notes (Signed)
Pt spoke with Dr. Mancel Bale and agreed to cancel her procedure due to work conflict.  Pt wasn't aware she was going to be put to sleep and stated she needed to make plans accordingly.

## 2020-01-06 NOTE — Progress Notes (Signed)
Spoke with pt.  She thought she could go back to work today and I helped her understand that after anesthesia it is recommended to take a coupe of days off which is normally explained to the pt.  In the office the cases are usually done on a Friday and pt returns to work that Monday.  With it being moved to the hospital and it being done during the week, the pt did not understand she would need to take a few days off.  Pt's insurance did not cover the anesthesia in the office so the pt opted to be done at the hospital.  Not sure where the confusion arose that she was not being put to sleep.  Pt stated she got nervous when she was told by one of the nurses that she was being cut and would have an incision in her stomach.  I explained the procedure again and pt said that's what she thought she was having done the LEEP and hysteroscopy D&C and I informed her that if she needed to postpone the procedure because it would interfere with her job then we can reschedule.  Pt opted to reschedule.

## 2020-01-06 NOTE — Progress Notes (Deleted)
  The note originally documented on this encounter has been moved the the encounter in which it belongs.  

## 2020-01-09 ENCOUNTER — Other Ambulatory Visit: Payer: Self-pay | Admitting: Obstetrics and Gynecology

## 2020-01-09 ENCOUNTER — Other Ambulatory Visit: Payer: Self-pay

## 2020-01-09 ENCOUNTER — Encounter (HOSPITAL_COMMUNITY): Payer: Self-pay | Admitting: Obstetrics and Gynecology

## 2020-01-09 NOTE — Progress Notes (Signed)
Spoke with pt for pre-op call. Pt denies cardiac history, HTN or Diabetes.   Pt tested positive for Covid in March, 2021 - results in [facility] (12/09/19)

## 2020-01-09 NOTE — Progress Notes (Signed)
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 It appears surgery was initially scheduled for 01/06/20 at the [facility], but patient cancelled due need to make work arrangements to have more time off.   History includes never smoker, childhood asthma, anemia, facial reconstruction surgery.  She'll need a urine pregnancy test on the day of surgery. 01/01/20 note from [place], RN with [facility] also indicate that HGB of 9.0 communicated with Dr. Mancel Bale, and patient will need a T&S.   She had a positive COVID-19 test on 12/09/19 ([place]). S/p bamlanivimab on 12/10/19. Given + COVID-19 test within 90 days, she should not need a repeat test before 01/12/20. Anesthesia team to evaluate on the day of surgery.     VS: As of 12/30/19: WT 136 KG: on 12/10/19: BP 137/91, HR 76   PROVIDERS: [facility]   LABS: Most recent lab results include: Lab Results  Component Value Date   WBC 10.9 (H) 12/31/2019   HGB 9.0 (L) 12/31/2019   HCT 32.2 (L) 12/31/2019   PLT 414 (H) 12/31/2019   GLUCOSE 91 12/31/2019   NA 139 12/31/2019   K 4.1 12/31/2019   CL 105 12/31/2019   CREATININE 0.72 12/31/2019   BUN 9 12/31/2019   CO2 24 12/31/2019      IMAGES: CXR 02/28/19: FINDINGS: Mild enlargement of the cardiopericardial silhouette, stable. Normal mediastinal and hilar contours. Clear lungs.  No pleural effusion or pneumothorax. Skeletal structures are unremarkable. IMPRESSION: 1. No active cardiopulmonary disease. 2. Mild stable cardiomegaly.   EKG: Normal sinus  rhythm Right axis deviation Abnormal ECG Confirmed by Lennice Sites [phone]) on 10/01/2019 12:55:49 PM - Isolated negative T wave in III   CV: Echo 02/13/19: IMPRESSIONS  1. The left ventricle has normal systolic function with an ejection  fraction of 60-65%. The cavity size was normal. Left ventricular diastolic  Doppler parameters are consistent with pseudonormalization.  2. The right ventricle has normal systolic function. The cavity was  normal. There is no increase in right ventricular wall thickness.  3. The mitral valve is abnormal. Mild thickening of the mitral valve  leaflet. No evidence of mitral valve stenosis.  4. No stenosis of the aortic valve.  5. The aortic root and ascending aorta are normal in size and structure.  6. The interatrial septum was not assessed.    Past Medical History:  Diagnosis Date  . Abnormal Pap smear   . Anemia   . Asthma    as a child, "outgrew it"    Past Surgical History:  Procedure Laterality Date  . CERVICAL CERCLAGE N/A 10/14/2014   Procedure: CERCLAGE CERVICAL;  Surgeon: Delice Lesch, MD;  Location: [facility];  Service: Gynecology;  Laterality: N/A;  . CESAREAN SECTION     2010  . CESAREAN SECTION N/A 01/29/2015   Procedure: CESAREAN SECTION;  Surgeon: Everett Graff, MD;  Location: [facility];  Service: Obstetrics;  Laterality: N/A;  . FACIAL RECONSTRUCTION SURGERY      MEDICATIONS: No current facility-administered medications for  this encounter.   Marland Kitchen albuterol (PROVENTIL HFA;VENTOLIN HFA) 108 (90 Base) MCG/ACT inhaler  . ferrous sulfate 325 (65 FE) MG tablet  . furosemide (LASIX) 20 MG tablet  . Multiple Vitamin (MULTIVITAMIN WITH MINERALS) TABS tablet  . norethindrone (AYGESTIN) 5 MG tablet    Brenda Gianotti, PA-C Surgical Short Stay/Anesthesiology [facility] Phone [phone] [facility] Phone [phone] 01/09/2020 1:10 PM

## 2020-01-09 NOTE — Anesthesia Preprocedure Evaluation (Addendum)
Anesthesia Evaluation  Patient identified by MRN, date of birth, ID band Patient awake    Reviewed: Allergy & Precautions, NPO status , Patient's Chart, lab work & pertinent test results  Airway Mallampati: I  TM Distance: >3 FB Neck ROM: Full    Dental  (+) Teeth Intact, Poor Dentition   Pulmonary asthma ,    Pulmonary exam normal        Cardiovascular negative cardio ROS Normal cardiovascular exam     Neuro/Psych negative neurological ROS  negative psych ROS   GI/Hepatic negative GI ROS, Neg liver ROS,   Endo/Other  negative endocrine ROS  Renal/GU negative Renal ROS     Musculoskeletal negative musculoskeletal ROS (+)   Abdominal Normal abdominal exam  (+)   Peds  Hematology negative hematology ROS (+)   Anesthesia Other Findings   Reproductive/Obstetrics                             Anesthesia Physical Anesthesia Plan  ASA: III  Anesthesia Plan: General   Post-op Pain Management:    Induction: Intravenous  PONV Risk Score and Plan: 4 or greater and Ondansetron, Dexamethasone, Midazolam, Scopolamine patch - Pre-op and Treatment may vary due to age or medical condition  Airway Management Planned: LMA  Additional Equipment: None  Intra-op Plan:   Post-operative Plan: Extubation in OR  Informed Consent: I have reviewed the patients History and Physical, chart, labs and discussed the procedure including the risks, benefits and alternatives for the proposed anesthesia with the patient or authorized representative who has indicated his/her understanding and acceptance.     Dental advisory given  Plan Discussed with: CRNA  Anesthesia Plan Comments: (See PAT note written 01/09/2020 by Myra Gianotti, PA-C. )       Anesthesia Quick Evaluation

## 2020-01-12 ENCOUNTER — Encounter (HOSPITAL_COMMUNITY): Payer: Self-pay | Admitting: Obstetrics and Gynecology

## 2020-01-12 ENCOUNTER — Encounter (HOSPITAL_COMMUNITY)
Admission: RE | Disposition: A | Discharge status: Home / Self Care | Payer: Self-pay | Source: Home / Self Care | Attending: Obstetrics and Gynecology

## 2020-01-12 ENCOUNTER — Ambulatory Visit (HOSPITAL_COMMUNITY)
Admission: RE | Admit: 2020-01-12 | Discharge: 2020-01-12 | Disposition: A | Discharge status: Home / Self Care | Payer: Medicaid Other | Attending: Obstetrics and Gynecology | Admitting: Obstetrics and Gynecology

## 2020-01-12 ENCOUNTER — Ambulatory Visit (HOSPITAL_COMMUNITY): Payer: Medicaid Other | Admitting: Anesthesiology

## 2020-01-12 ENCOUNTER — Other Ambulatory Visit: Payer: Self-pay

## 2020-01-12 DIAGNOSIS — N870 Mild cervical dysplasia: Secondary | ICD-10-CM | POA: Insufficient documentation

## 2020-01-12 DIAGNOSIS — Z79899 Other long term (current) drug therapy: Secondary | ICD-10-CM | POA: Insufficient documentation

## 2020-01-12 DIAGNOSIS — J45909 Unspecified asthma, uncomplicated: Secondary | ICD-10-CM | POA: Insufficient documentation

## 2020-01-12 DIAGNOSIS — N920 Excessive and frequent menstruation with regular cycle: Secondary | ICD-10-CM | POA: Insufficient documentation

## 2020-01-12 HISTORY — PX: LEEP: SHX91

## 2020-01-12 HISTORY — DX: Pneumonia, unspecified organism: J18.9

## 2020-01-12 HISTORY — PX: HYSTEROSCOPY WITH RESECTOSCOPE: SHX5395

## 2020-01-12 LAB — CBC
HCT: 38.7 % (ref 36.0–46.0)
Hemoglobin: 10.4 g/dL — ABNORMAL LOW (ref 12.0–15.0)
MCH: 16.8 pg — ABNORMAL LOW (ref 26.0–34.0)
MCHC: 26.9 g/dL — ABNORMAL LOW (ref 30.0–36.0)
MCV: 62.6 fL — ABNORMAL LOW (ref 80.0–100.0)
Platelets: 318 10*3/uL (ref 150–400)
RBC: 6.18 MIL/uL — ABNORMAL HIGH (ref 3.87–5.11)
RDW: 26.5 % — ABNORMAL HIGH (ref 11.5–15.5)
WBC: 6.1 10*3/uL (ref 4.0–10.5)
nRBC: 0 % (ref 0.0–0.2)

## 2020-01-12 LAB — TYPE AND SCREEN
ABO/RH(D): O POS
Antibody Screen: NEGATIVE

## 2020-01-12 LAB — POCT PREGNANCY, URINE: Preg Test, Ur: NEGATIVE

## 2020-01-12 SURGERY — HYSTEROSCOPY, USING RESECTOSCOPE
Anesthesia: General | Site: Vagina

## 2020-01-12 MED ORDER — SODIUM CHLORIDE (PF) 0.9 % IJ SOLN
INTRAMUSCULAR | Status: AC
  Filled 2020-01-12: qty 100

## 2020-01-12 MED ORDER — ONDANSETRON HCL 4 MG/2ML IJ SOLN
INTRAMUSCULAR | Status: AC
  Filled 2020-01-12: qty 2

## 2020-01-12 MED ORDER — MEPERIDINE HCL 25 MG/ML IJ SOLN: 6.2500 mg | INTRAMUSCULAR | Status: DC | PRN

## 2020-01-12 MED ORDER — SCOPOLAMINE 1 MG/3DAYS TD PT72
MEDICATED_PATCH | TRANSDERMAL | Status: AC
  Filled 2020-01-12: qty 1

## 2020-01-12 MED ORDER — IBUPROFEN 600 MG PO TABS
600.0000 mg | ORAL_TABLET | Freq: Four times a day (QID) | ORAL | 1 refills | Status: DC | PRN
Start: 2020-01-12 — End: 2022-12-07

## 2020-01-12 MED ORDER — PHENYLEPHRINE 40 MCG/ML (10ML) SYRINGE FOR IV PUSH (FOR BLOOD PRESSURE SUPPORT)
PREFILLED_SYRINGE | INTRAVENOUS | Status: DC | PRN
  Administered 2020-01-12: 120 ug via INTRAVENOUS
  Administered 2020-01-12: 80 ug via INTRAVENOUS
  Administered 2020-01-12: 120 ug via INTRAVENOUS

## 2020-01-12 MED ORDER — FENTANYL CITRATE (PF) 100 MCG/2ML IJ SOLN
INTRAMUSCULAR | Status: DC | PRN
  Administered 2020-01-12: 100 ug via INTRAVENOUS

## 2020-01-12 MED ORDER — LIDOCAINE-EPINEPHRINE 1 %-1:100000 IJ SOLN
INTRAMUSCULAR | Status: AC
  Filled 2020-01-12: qty 1

## 2020-01-12 MED ORDER — LACTATED RINGERS IV SOLN: INTRAVENOUS | Status: DC

## 2020-01-12 MED ORDER — ONDANSETRON HCL 4 MG/2ML IJ SOLN
INTRAMUSCULAR | Status: DC | PRN
  Administered 2020-01-12: 4 mg via INTRAVENOUS

## 2020-01-12 MED ORDER — PROPOFOL 10 MG/ML IV BOLUS
INTRAVENOUS | Status: DC | PRN
  Administered 2020-01-12: 200 mg via INTRAVENOUS

## 2020-01-12 MED ORDER — FENTANYL CITRATE (PF) 250 MCG/5ML IJ SOLN
INTRAMUSCULAR | Status: AC
  Filled 2020-01-12: qty 5

## 2020-01-12 MED ORDER — ACETIC ACID 4% SOLUTION
Status: DC | PRN
  Administered 2020-01-12: 1 via TOPICAL

## 2020-01-12 MED ORDER — SCOPOLAMINE 1 MG/3DAYS TD PT72
MEDICATED_PATCH | TRANSDERMAL | Status: DC | PRN
  Administered 2020-01-12: 1 via TRANSDERMAL

## 2020-01-12 MED ORDER — FERRIC SUBSULFATE SOLN
Status: DC | PRN
  Administered 2020-01-12: 1 via TOPICAL

## 2020-01-12 MED ORDER — ACETAMINOPHEN 325 MG PO TABS: 325.0000 mg | ORAL_TABLET | Freq: Once | ORAL | Status: DC | PRN

## 2020-01-12 MED ORDER — SODIUM CHLORIDE 0.9 % IR SOLN
Status: DC | PRN
  Administered 2020-01-12: 3000 mL

## 2020-01-12 MED ORDER — MIDAZOLAM HCL 5 MG/5ML IJ SOLN
INTRAMUSCULAR | Status: DC | PRN
  Administered 2020-01-12: 2 mg via INTRAVENOUS

## 2020-01-12 MED ORDER — ACETAMINOPHEN 160 MG/5ML PO SOLN: 325.0000 mg | Freq: Once | ORAL | Status: DC | PRN

## 2020-01-12 MED ORDER — LIDOCAINE-EPINEPHRINE 1 %-1:100000 IJ SOLN
INTRAMUSCULAR | Status: DC | PRN
  Administered 2020-01-12: 10 mL

## 2020-01-12 MED ORDER — MIDAZOLAM HCL 2 MG/2ML IJ SOLN
INTRAMUSCULAR | Status: AC
  Filled 2020-01-12: qty 2

## 2020-01-12 MED ORDER — LIDOCAINE HCL (CARDIAC) PF 100 MG/5ML IV SOSY
PREFILLED_SYRINGE | INTRAVENOUS | Status: DC | PRN
  Administered 2020-01-12: 40 mg via INTRAVENOUS

## 2020-01-12 MED ORDER — VASOPRESSIN 20 UNIT/ML IV SOLN
INTRAVENOUS | Status: DC | PRN
  Administered 2020-01-12: 20 mL via INTRAMUSCULAR

## 2020-01-12 MED ORDER — DEXAMETHASONE SODIUM PHOSPHATE 4 MG/ML IJ SOLN
INTRAMUSCULAR | Status: DC | PRN
  Administered 2020-01-12: 8 mg via INTRAVENOUS

## 2020-01-12 MED ORDER — ACETAMINOPHEN 10 MG/ML IV SOLN: 1000.0000 mg | Freq: Once | INTRAVENOUS | Status: DC | PRN

## 2020-01-12 MED ORDER — FENTANYL CITRATE (PF) 100 MCG/2ML IJ SOLN: 25.0000 ug | INTRAMUSCULAR | Status: DC | PRN

## 2020-01-12 MED ORDER — PROMETHAZINE HCL 25 MG/ML IJ SOLN: 6.2500 mg | INTRAMUSCULAR | Status: DC | PRN

## 2020-01-12 MED ORDER — IODINE STRONG (LUGOLS) 5 % PO SOLN
ORAL | Status: AC
  Filled 2020-01-12: qty 1

## 2020-01-12 MED ORDER — HYDROCODONE-ACETAMINOPHEN 5-325 MG PO TABS: 1.0000 | ORAL_TABLET | Freq: Four times a day (QID) | ORAL | 0 refills | Status: DC | PRN

## 2020-01-12 MED ORDER — VASOPRESSIN 20 UNIT/ML IV SOLN
INTRAVENOUS | Status: AC
  Filled 2020-01-12: qty 1

## 2020-01-12 MED ORDER — DEXAMETHASONE SODIUM PHOSPHATE 10 MG/ML IJ SOLN
INTRAMUSCULAR | Status: AC
  Filled 2020-01-12: qty 1

## 2020-01-12 SURGICAL SUPPLY — 36 items
APPLICATOR COTTON TIP 6 STRL (MISCELLANEOUS) ×1 IMPLANT
APPLICATOR COTTON TIP 6IN STRL (MISCELLANEOUS) ×3
BIPOLAR CUTTING LOOP 21FR (ELECTRODE)
CATH ROBINSON RED A/P 16FR (CATHETERS) ×3 IMPLANT
CLEANER TIP ELECTROSURG 2X2 (MISCELLANEOUS) ×3 IMPLANT
COVER SURGICAL LIGHT HANDLE (MISCELLANEOUS) ×3 IMPLANT
ELECT BALL LEEP 5MM RED (ELECTRODE) ×3 IMPLANT
ELECT LOOP LEEP RND 15X12 GRN (CUTTING LOOP)
ELECT LOOP LEEP RND 20X12 WHT (CUTTING LOOP) ×3
ELECT LOOP LEEP SQR 10X10 ORG (CUTTING LOOP)
ELECT REM PT RETURN 9FT ADLT (ELECTROSURGICAL) ×3
ELECTRODE LOOP LP RND 15X12GRN (CUTTING LOOP) IMPLANT
ELECTRODE LOOP LP RND 20X12WHT (CUTTING LOOP) ×1 IMPLANT
ELECTRODE LOOP LP SQR 10X10ORG (CUTTING LOOP) IMPLANT
ELECTRODE REM PT RTRN 9FT ADLT (ELECTROSURGICAL) ×1 IMPLANT
EXTENDER ELECT LOOP LEEP 10CM (CUTTING LOOP) IMPLANT
GLOVE BIO SURGEON STRL SZ7.5 (GLOVE) ×3 IMPLANT
GLOVE BIOGEL PI IND STRL 7.0 (GLOVE) ×1 IMPLANT
GLOVE BIOGEL PI IND STRL 7.5 (GLOVE) ×1 IMPLANT
GLOVE BIOGEL PI INDICATOR 7.0 (GLOVE) ×2
GLOVE BIOGEL PI INDICATOR 7.5 (GLOVE) ×2
GOWN STRL REUS W/ TWL LRG LVL3 (GOWN DISPOSABLE) ×2 IMPLANT
GOWN STRL REUS W/TWL LRG LVL3 (GOWN DISPOSABLE) ×6
KIT PROCEDURE FLUENT (KITS) ×3 IMPLANT
KIT TURNOVER KIT B (KITS) ×3 IMPLANT
LOOP CUTTING BIPOLAR 21FR (ELECTRODE) IMPLANT
NS IRRIG 1000ML POUR BTL (IV SOLUTION) ×3 IMPLANT
PACK VAGINAL MINOR WOMEN LF (CUSTOM PROCEDURE TRAY) ×3 IMPLANT
PAD OB MATERNITY 4.3X12.25 (PERSONAL CARE ITEMS) ×3 IMPLANT
PENCIL SMOKE EVACUATOR (MISCELLANEOUS) ×3 IMPLANT
SCOPETTES 8  STERILE (MISCELLANEOUS) ×6
SCOPETTES 8 STERILE (MISCELLANEOUS) ×2 IMPLANT
SPONGE SURGIFOAM ABS GEL 12-7 (HEMOSTASIS) IMPLANT
SUT VIC AB 0 CT1 27 (SUTURE) ×3
SUT VIC AB 0 CT1 27XBRD ANBCTR (SUTURE) ×1 IMPLANT
TOWEL GREEN STERILE FF (TOWEL DISPOSABLE) ×6 IMPLANT

## 2020-01-12 NOTE — Anesthesia Procedure Notes (Signed)
Procedure Name: LMA Insertion Date/Time: 01/12/2020 11:59 AM Performed by: Leonor Liv, CRNA Pre-anesthesia Checklist: Patient identified, Emergency Drugs available, Suction available and Patient being monitored Patient Re-evaluated:Patient Re-evaluated prior to induction Oxygen Delivery Method: Circle System Utilized Preoxygenation: Pre-oxygenation with 100% oxygen Induction Type: IV induction Ventilation: Mask ventilation without difficulty LMA: LMA inserted LMA Size: 4.0 Number of attempts: 1 Placement Confirmation: positive ETCO2 Tube secured with: Tape Dental Injury: Teeth and Oropharynx as per pre-operative assessment

## 2020-01-12 NOTE — Transfer of Care (Signed)
Immediate Anesthesia Transfer of Care Note  Patient: Brenda [name]  Procedure(s) Performed: HYSTEROSCOPY D AND C  WITH RESECTOSCOPE (N/A Vagina ) LOOP ELECTROSURGICAL EXCISION PROCEDURE (LEEP) (N/A Vagina )  Patient Location: PACU  Anesthesia Type:General  Level of Consciousness: awake, alert  and oriented  Airway & Oxygen Therapy: Patient Spontanous Breathing  Post-op Assessment: Report given to RN, Post -op Vital signs reviewed and stable and Patient moving all extremities  Post vital signs: Reviewed and stable  Last Vitals:  Vitals Value Taken Time  BP 156/90 01/12/20 1311  Temp    Pulse 75 01/12/20 1316  Resp 16 01/12/20 1316  SpO2 97 % 01/12/20 1316  Vitals shown include unvalidated device data.  Last Pain:  Vitals:   01/12/20 1014  TempSrc:   PainSc: 0-No pain         Complications: No apparent anesthesia complications

## 2020-01-12 NOTE — Op Note (Signed)
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   Postop Diagnosis:  1.Persistent Cervical Intraepithelial Neoplasia I 2.MENORRHAGIA   Procedure: 1.HYSTEROSCOPY 2.DILATION AND CURETTAGE 3.LOOP ELECTROSURGICAL EXCISION PROCEDURE (LEEP)   Anesthesia: General   Anesthesiologist: Effie Berkshire, MD   Attending: Everett Graff, MD   Assistant: N/a  Findings: No obvious intracavitary lesions  Pathology: 1.endometrial curettings 2.LEEP specimen (stitch placed at 12 o'clock)  Fluids: 1000cc  UOP: 100 CC  EBL: 20 CC  Complications: None  Procedure:The patient was taken to the operating room after the risks, benefits and alternatives were discussed with the patient. The patient verbalized understanding and consent signed and witnessed. The patient was placed under general anesthesia with an LMA per anesthesiologist and prepped and draped in the normal sterile fashion.  Time Out was performed per protocol and procedure confirmed as hysteroscopy D&C possible resection and LEEP.  A bivalve speculum was placed in the patient's vagina and the anterior lip of the cervix was grasped with a single tooth tenaculum. A paracervical block was administered using a total of 10 cc of 1% lidocaine. The uterus sounded to 10 cm. The cervix was dilated for passage of the hysteroscope.  The hysteroscope was introduced into the uterine cavity and findings as noted above. Sharp curettage was performed until a gritty texture was noted and curettings were sent to pathology. The hysteroscope was reintroduced and no obvious remaining intracavitary lesions were noted.    Dilute pitressin was injected.  LEEP was performed without difficulty and the bed of the cervix was cauterized with the ball tip cautery with good hemostasis.  Stitch placed at 12 o'clock and monsels solution applied as well.  Specimens sent to pathology.  All instruments were removed. Sponge lap and needle count  was correct. Patient tolerated the procedure well and was awaiting transfer to the recovery room in good condition.

## 2020-01-12 NOTE — H&P (Addendum)
Brenda [name] is an 34 y.o. female. Pt well known to me presenting for hysteroscopy D&C possible resection and LEEP d/t h/o menorrhagia with cervical dysplasia (persistent CIN I 2020 and 2021).  Pertinent Gynecological History: Menorrhagia with anemia  Menstrual History: Patient's last menstrual period was 12/27/2019.    Past Medical History:  Diagnosis Date  . Abnormal Pap smear   . Anemia   . Asthma    as a child, "outgrew it"  . Pneumonia     Past Surgical History:  Procedure Laterality Date  . CERVICAL CERCLAGE N/A 10/14/2014   Procedure: CERCLAGE CERVICAL;  Surgeon: Delice Lesch, MD;  Location: [facility];  Service: Gynecology;  Laterality: N/A;  . CESAREAN SECTION     2010  . CESAREAN SECTION N/A 01/29/2015   Procedure: CESAREAN SECTION;  Surgeon: Everett Graff, MD;  Location: [facility];  Service: Obstetrics;  Laterality: N/A;  . FACIAL RECONSTRUCTION SURGERY      Family History  Problem Relation Age of Onset  . Stroke Mother   . Diabetes Mother   . Hypertension Mother   . Coronary artery disease Mother   . Hypertension Father     Social History:  reports that she has never smoked. She has never used smokeless tobacco. She reports current alcohol use. She reports that she does not use drugs.  Allergies:  Allergies  Allergen Reactions  . Oxycodone Hcl Hives  . Latex Itching  . Terconazole Itching    Medications Prior to Admission  Medication Sig Dispense Refill Last Dose  . ferrous sulfate 325 (65 FE) MG tablet Take 325 mg by mouth 2 (two) times daily with a meal.    01/02/2020  . furosemide (LASIX) 20 MG tablet Take 1 tablet (20 mg total) by mouth daily as needed for fluid. (Patient taking differently: Take 20 mg by mouth daily. ) 5 tablet 0 01/09/2020  . Multiple Vitamin (MULTIVITAMIN WITH MINERALS) TABS tablet Take 1 tablet by mouth daily.   01/09/2020  . norethindrone (AYGESTIN) 5 MG tablet Take 5 mg by mouth daily.    01/09/2020 at Unknown time  . albuterol  (PROVENTIL HFA;VENTOLIN HFA) 108 (90 Base) MCG/ACT inhaler Inhale 1-2 puffs into the lungs every 6 (six) hours as needed for wheezing or shortness of breath. 1 Inhaler 0 More than a month at Unknown time    Review of Systems  Blood pressure (!) 168/82, pulse 74, temperature 98 F (36.7 C), temperature source Oral, resp. rate 18, height 5\' 9"  (1.753 m), weight 136.1 kg, last menstrual period 12/27/2019, SpO2 92 %, unknown if currently breastfeeding. Physical Exam  Results for orders placed or performed during the hospital encounter of 01/12/20 (from the past 24 hour(s))  Pregnancy, urine POC     Status: None   Collection Time: 01/12/20 10:01 AM  Result Value Ref Range   Preg Test, Ur NEGATIVE NEGATIVE  Type and screen [place]     Status: None (Preliminary result)   Collection Time: 01/12/20 10:30 AM  Result Value Ref Range   ABO/RH(D) O POS    Antibody Screen PENDING    Sample Expiration      01/15/2020,2359 Performed at [facility], [place] [address] [place], [place] [address]     No results found.  12/2019 ultrasound - 3cm fibroid and ?PCOS appearance of ovaries, no fibroid or polyp CIN I at 8 and11 2021 and 7 and 10 2020  Assessment/Plan: P2 presenting today for hysteroscopy D&C possible resection  and LEEP.  Reviewed procedure with patient, r/b/a discussed consent s/w and questions answered.  Delice Lesch 01/12/2020, 11:36 AM

## 2020-01-13 ENCOUNTER — Encounter: Payer: Self-pay | Admitting: *Deleted

## 2020-01-13 LAB — SURGICAL PATHOLOGY

## 2020-01-13 NOTE — Anesthesia Postprocedure Evaluation (Addendum)
Anesthesia Post Note  Patient: Brenda [name]  Procedure(s) Performed: HYSTEROSCOPY D AND C  WITH RESECTOSCOPE (N/A Vagina ) LOOP ELECTROSURGICAL EXCISION PROCEDURE (LEEP) (N/A Vagina )     Patient location during evaluation: PACU Anesthesia Type: General Level of consciousness: awake and alert Pain management: pain level controlled Vital Signs Assessment: post-procedure vital signs reviewed and stable Respiratory status: spontaneous breathing, nonlabored ventilation, respiratory function stable and patient connected to nasal cannula oxygen Cardiovascular status: blood pressure returned to baseline and stable Postop Assessment: no apparent nausea or vomiting Anesthetic complications: no    Last Vitals:  Vitals:   01/12/20 1325 01/12/20 1350  BP: (!) 146/89   Pulse: 70   Resp: 13   Temp:  36.6 C  SpO2: 92%     Last Pain:  Vitals:   01/12/20 1350  TempSrc:   PainSc: 0-No pain                 Effie Berkshire

## 2020-02-15 ENCOUNTER — Encounter (HOSPITAL_COMMUNITY): Payer: Self-pay | Admitting: Emergency Medicine

## 2020-02-15 ENCOUNTER — Emergency Department (HOSPITAL_COMMUNITY): Payer: Medicaid Other

## 2020-02-15 ENCOUNTER — Other Ambulatory Visit: Payer: Self-pay

## 2020-02-15 ENCOUNTER — Inpatient Hospital Stay (HOSPITAL_COMMUNITY)
Admission: EM | Admit: 2020-02-15 | Discharge: 2020-02-17 | DRG: 872 | Disposition: A | Discharge status: Home / Self Care | Payer: Medicaid Other | Attending: Internal Medicine | Admitting: Internal Medicine

## 2020-02-15 DIAGNOSIS — E869 Volume depletion, unspecified: Secondary | ICD-10-CM | POA: Diagnosis present

## 2020-02-15 DIAGNOSIS — L03115 Cellulitis of right lower limb: Secondary | ICD-10-CM

## 2020-02-15 DIAGNOSIS — Z8616 Personal history of COVID-19: Secondary | ICD-10-CM | POA: Diagnosis present

## 2020-02-15 DIAGNOSIS — Z91040 Latex allergy status: Secondary | ICD-10-CM

## 2020-02-15 DIAGNOSIS — Z6841 Body Mass Index (BMI) 40.0 and over, adult: Secondary | ICD-10-CM

## 2020-02-15 DIAGNOSIS — A419 Sepsis, unspecified organism: Principal | ICD-10-CM | POA: Diagnosis present

## 2020-02-15 DIAGNOSIS — Z79899 Other long term (current) drug therapy: Secondary | ICD-10-CM

## 2020-02-15 DIAGNOSIS — D500 Iron deficiency anemia secondary to blood loss (chronic): Secondary | ICD-10-CM | POA: Diagnosis present

## 2020-02-15 DIAGNOSIS — N920 Excessive and frequent menstruation with regular cycle: Secondary | ICD-10-CM | POA: Diagnosis present

## 2020-02-15 DIAGNOSIS — R739 Hyperglycemia, unspecified: Secondary | ICD-10-CM | POA: Diagnosis present

## 2020-02-15 DIAGNOSIS — Z20822 Contact with and (suspected) exposure to covid-19: Secondary | ICD-10-CM | POA: Diagnosis present

## 2020-02-15 DIAGNOSIS — E872 Acidosis, unspecified: Secondary | ICD-10-CM | POA: Diagnosis present

## 2020-02-15 DIAGNOSIS — Z79891 Long term (current) use of opiate analgesic: Secondary | ICD-10-CM

## 2020-02-15 DIAGNOSIS — J4520 Mild intermittent asthma, uncomplicated: Secondary | ICD-10-CM | POA: Diagnosis present

## 2020-02-15 DIAGNOSIS — Z885 Allergy status to narcotic agent status: Secondary | ICD-10-CM

## 2020-02-15 NOTE — ED Triage Notes (Signed)
Pt states she woke up today with her right food swollen, denies any injury, states this happened before and her food was infected.

## 2020-02-16 ENCOUNTER — Inpatient Hospital Stay (HOSPITAL_COMMUNITY): Payer: Medicaid Other

## 2020-02-16 ENCOUNTER — Encounter (HOSPITAL_COMMUNITY): Payer: Self-pay | Admitting: Internal Medicine

## 2020-02-16 DIAGNOSIS — E872 Acidosis, unspecified: Secondary | ICD-10-CM | POA: Diagnosis present

## 2020-02-16 DIAGNOSIS — L0390 Cellulitis, unspecified: Secondary | ICD-10-CM

## 2020-02-16 DIAGNOSIS — Z8616 Personal history of COVID-19: Secondary | ICD-10-CM

## 2020-02-16 DIAGNOSIS — Z885 Allergy status to narcotic agent status: Secondary | ICD-10-CM | POA: Diagnosis not present

## 2020-02-16 DIAGNOSIS — E869 Volume depletion, unspecified: Secondary | ICD-10-CM | POA: Diagnosis present

## 2020-02-16 DIAGNOSIS — N920 Excessive and frequent menstruation with regular cycle: Secondary | ICD-10-CM | POA: Diagnosis present

## 2020-02-16 DIAGNOSIS — R739 Hyperglycemia, unspecified: Secondary | ICD-10-CM

## 2020-02-16 DIAGNOSIS — D500 Iron deficiency anemia secondary to blood loss (chronic): Secondary | ICD-10-CM

## 2020-02-16 DIAGNOSIS — Z6841 Body Mass Index (BMI) 40.0 and over, adult: Secondary | ICD-10-CM | POA: Diagnosis not present

## 2020-02-16 DIAGNOSIS — Z91040 Latex allergy status: Secondary | ICD-10-CM | POA: Diagnosis not present

## 2020-02-16 DIAGNOSIS — R609 Edema, unspecified: Secondary | ICD-10-CM

## 2020-02-16 DIAGNOSIS — Z20822 Contact with and (suspected) exposure to covid-19: Secondary | ICD-10-CM | POA: Diagnosis present

## 2020-02-16 DIAGNOSIS — L03115 Cellulitis of right lower limb: Secondary | ICD-10-CM | POA: Diagnosis not present

## 2020-02-16 DIAGNOSIS — E66813 Obesity, class 3: Secondary | ICD-10-CM

## 2020-02-16 DIAGNOSIS — A419 Sepsis, unspecified organism: Secondary | ICD-10-CM | POA: Diagnosis not present

## 2020-02-16 DIAGNOSIS — Z79899 Other long term (current) drug therapy: Secondary | ICD-10-CM | POA: Diagnosis not present

## 2020-02-16 DIAGNOSIS — J4520 Mild intermittent asthma, uncomplicated: Secondary | ICD-10-CM | POA: Diagnosis present

## 2020-02-16 DIAGNOSIS — Z79891 Long term (current) use of opiate analgesic: Secondary | ICD-10-CM | POA: Diagnosis not present

## 2020-02-16 HISTORY — DX: Mild intermittent asthma, uncomplicated: J45.20

## 2020-02-16 HISTORY — DX: Personal history of COVID-19: Z86.16

## 2020-02-16 HISTORY — DX: Body Mass Index (BMI) 40.0 and over, adult: Z684

## 2020-02-16 HISTORY — DX: Morbid (severe) obesity due to excess calories: E66.01

## 2020-02-16 HISTORY — DX: Iron deficiency anemia secondary to blood loss (chronic): D50.0

## 2020-02-16 HISTORY — DX: Obesity, class 3: E66.813

## 2020-02-16 LAB — CBC WITH DIFFERENTIAL/PLATELET
Abs Immature Granulocytes: 0.14 10*3/uL — ABNORMAL HIGH (ref 0.00–0.07)
Abs Immature Granulocytes: 0.07 10*3/uL (ref 0.00–0.07)
Basophils Absolute: 0 10*3/uL (ref 0.0–0.1)
Basophils Absolute: 0 10*3/uL (ref 0.0–0.1)
Basophils Relative: 0 %
Basophils Relative: 0 %
Eosinophils Absolute: 0 10*3/uL (ref 0.0–0.5)
Eosinophils Absolute: 0 10*3/uL (ref 0.0–0.5)
Eosinophils Relative: 0 %
Eosinophils Relative: 0 %
HCT: 28.3 % — ABNORMAL LOW (ref 36.0–46.0)
HCT: 25.8 % — ABNORMAL LOW (ref 36.0–46.0)
Hemoglobin: 8.1 g/dL — ABNORMAL LOW (ref 12.0–15.0)
Hemoglobin: 7.6 g/dL — ABNORMAL LOW (ref 12.0–15.0)
Immature Granulocytes: 1 %
Immature Granulocytes: 0 %
Lymphocytes Relative: 5 %
Lymphocytes Relative: 6 %
Lymphs Abs: 1 10*3/uL (ref 0.7–4.0)
Lymphs Abs: 1.2 10*3/uL (ref 0.7–4.0)
MCH: 17.9 pg — ABNORMAL LOW (ref 26.0–34.0)
MCH: 18.3 pg — ABNORMAL LOW (ref 26.0–34.0)
MCHC: 28.6 g/dL — ABNORMAL LOW (ref 30.0–36.0)
MCHC: 29.5 g/dL — ABNORMAL LOW (ref 30.0–36.0)
MCV: 62.6 fL — ABNORMAL LOW (ref 80.0–100.0)
MCV: 62.2 fL — ABNORMAL LOW (ref 80.0–100.0)
Monocytes Absolute: 1.8 10*3/uL — ABNORMAL HIGH (ref 0.1–1.0)
Monocytes Absolute: 2 10*3/uL — ABNORMAL HIGH (ref 0.1–1.0)
Monocytes Relative: 8 %
Monocytes Relative: 11 %
Neutro Abs: 20.1 10*3/uL — ABNORMAL HIGH (ref 1.7–7.7)
Neutro Abs: 14.7 10*3/uL — ABNORMAL HIGH (ref 1.7–7.7)
Neutrophils Relative %: 86 %
Neutrophils Relative %: 83 %
Platelets: 328 10*3/uL (ref 150–400)
Platelets: 324 10*3/uL (ref 150–400)
RBC: 4.52 MIL/uL (ref 3.87–5.11)
RBC: 4.15 MIL/uL (ref 3.87–5.11)
RDW: 23.6 % — ABNORMAL HIGH (ref 11.5–15.5)
RDW: 23.3 % — ABNORMAL HIGH (ref 11.5–15.5)
WBC: 23.1 10*3/uL — ABNORMAL HIGH (ref 4.0–10.5)
WBC: 18 10*3/uL — ABNORMAL HIGH (ref 4.0–10.5)
nRBC: 0 % (ref 0.0–0.2)
nRBC: 0 % (ref 0.0–0.2)

## 2020-02-16 LAB — COMPREHENSIVE METABOLIC PANEL
ALT: 15 U/L (ref 0–44)
AST: 21 U/L (ref 15–41)
Albumin: 3.2 g/dL — ABNORMAL LOW (ref 3.5–5.0)
Alkaline Phosphatase: 48 U/L (ref 38–126)
Anion gap: 12 (ref 5–15)
BUN: 8 mg/dL (ref 6–20)
CO2: 23 mmol/L (ref 22–32)
Calcium: 7.8 mg/dL — ABNORMAL LOW (ref 8.9–10.3)
Chloride: 100 mmol/L (ref 98–111)
Creatinine, Ser: 0.8 mg/dL (ref 0.44–1.00)
GFR calc Af Amer: >60 mL/min (ref >60)
GFR calc non Af Amer: >60 mL/min (ref >60)
Glucose, Bld: 111 mg/dL — ABNORMAL HIGH (ref 70–99)
Potassium: 3.5 mmol/L (ref 3.5–5.1)
Sodium: 135 mmol/L (ref 135–145)
Total Bilirubin: 0.7 mg/dL (ref 0.3–1.2)
Total Protein: 6.5 g/dL (ref 6.5–8.1)

## 2020-02-16 LAB — BASIC METABOLIC PANEL
Anion gap: 9 (ref 5–15)
BUN: 7 mg/dL (ref 6–20)
CO2: 25 mmol/L (ref 22–32)
Calcium: 8.6 mg/dL — ABNORMAL LOW (ref 8.9–10.3)
Chloride: 102 mmol/L (ref 98–111)
Creatinine, Ser: 0.9 mg/dL (ref 0.44–1.00)
GFR calc Af Amer: >60 mL/min (ref >60)
GFR calc non Af Amer: >60 mL/min (ref >60)
Glucose, Bld: 141 mg/dL — ABNORMAL HIGH (ref 70–99)
Potassium: 3.3 mmol/L — ABNORMAL LOW (ref 3.5–5.1)
Sodium: 136 mmol/L (ref 135–145)

## 2020-02-16 LAB — HEMOGLOBIN A1C
Hgb A1c MFr Bld: 5.4 % (ref 4.8–5.6)
Mean Plasma Glucose: 108.28 mg/dL

## 2020-02-16 LAB — VITAMIN B12: Vitamin B-12: 248 pg/mL (ref 180–914)

## 2020-02-16 LAB — IRON AND TIBC
Iron: 6 ug/dL — ABNORMAL LOW (ref 28–170)
Saturation Ratios: 1 % — ABNORMAL LOW (ref 10.4–31.8)
TIBC: 456 ug/dL — ABNORMAL HIGH (ref 250–450)
UIBC: 450 ug/dL

## 2020-02-16 LAB — TYPE AND SCREEN
ABO/RH(D): O POS
Antibody Screen: NEGATIVE

## 2020-02-16 LAB — PROCALCITONIN: Procalcitonin: 1.33 ng/mL

## 2020-02-16 LAB — FOLATE: Folate: 16.7 ng/mL (ref >5.9)

## 2020-02-16 LAB — LACTIC ACID, PLASMA
Lactic Acid, Venous: 2.7 mmol/L (ref 0.5–1.9)
Lactic Acid, Venous: 2.6 mmol/L (ref 0.5–1.9)

## 2020-02-16 LAB — APTT: aPTT: 40 seconds — ABNORMAL HIGH (ref 24–36)

## 2020-02-16 LAB — SARS CORONAVIRUS 2 BY RT PCR (HOSPITAL ORDER, PERFORMED IN ~~LOC~~ HOSPITAL LAB): SARS Coronavirus 2: NEGATIVE

## 2020-02-16 LAB — PROTIME-INR
INR: 1.6 — ABNORMAL HIGH (ref 0.8–1.2)
Prothrombin Time: 18.3 seconds — ABNORMAL HIGH (ref 11.4–15.2)

## 2020-02-16 LAB — HIV ANTIBODY (ROUTINE TESTING W REFLEX): HIV Screen 4th Generation wRfx: NONREACTIVE

## 2020-02-16 MED ORDER — LACTATED RINGERS IV SOLN: INTRAVENOUS | Status: AC

## 2020-02-16 MED ORDER — SODIUM CHLORIDE 0.9 % IV SOLN
2.0000 g | INTRAVENOUS | Status: DC
  Administered 2020-02-16: 2 g via INTRAVENOUS
  Filled 2020-02-16: qty 2

## 2020-02-16 MED ORDER — NORETHINDRONE ACETATE 5 MG PO TABS
5.0000 mg | ORAL_TABLET | Freq: Every day | ORAL | Status: DC
  Administered 2020-02-16 – 2020-02-17 (×2): 5 mg via ORAL
  Filled 2020-02-16 (×2): qty 1

## 2020-02-16 MED ORDER — ACETAMINOPHEN 650 MG RE SUPP: 650.0000 mg | Freq: Four times a day (QID) | RECTAL | Status: DC | PRN

## 2020-02-16 MED ORDER — MORPHINE SULFATE (PF) 2 MG/ML IV SOLN
2.0000 mg | INTRAVENOUS | Status: DC | PRN
  Administered 2020-02-16: 2 mg via INTRAVENOUS
  Filled 2020-02-16: qty 1

## 2020-02-16 MED ORDER — POLYETHYLENE GLYCOL 3350 17 G PO PACK: 17.0000 g | PACK | Freq: Every day | ORAL | Status: DC | PRN

## 2020-02-16 MED ORDER — FERROUS SULFATE 325 (65 FE) MG PO TABS
325.0000 mg | ORAL_TABLET | Freq: Two times a day (BID) | ORAL | Status: DC
  Administered 2020-02-16 – 2020-02-17 (×3): 325 mg via ORAL
  Filled 2020-02-16 (×3): qty 1

## 2020-02-16 MED ORDER — ACETAMINOPHEN 325 MG PO TABS
650.0000 mg | ORAL_TABLET | Freq: Four times a day (QID) | ORAL | Status: DC | PRN
  Administered 2020-02-16: 650 mg via ORAL
  Filled 2020-02-16: qty 2

## 2020-02-16 MED ORDER — ACETAMINOPHEN 325 MG PO TABS
650.0000 mg | ORAL_TABLET | Freq: Once | ORAL | Status: AC
  Administered 2020-02-16: 650 mg via ORAL
  Filled 2020-02-16: qty 2

## 2020-02-16 MED ORDER — IBUPROFEN 200 MG PO TABS
400.0000 mg | ORAL_TABLET | Freq: Four times a day (QID) | ORAL | Status: DC | PRN
  Administered 2020-02-16 (×2): 400 mg via ORAL
  Filled 2020-02-16 (×2): qty 2

## 2020-02-16 MED ORDER — VANCOMYCIN HCL 1250 MG/250ML IV SOLN
1250.0000 mg | Freq: Two times a day (BID) | INTRAVENOUS | Status: DC
  Administered 2020-02-16 – 2020-02-17 (×2): 1250 mg via INTRAVENOUS
  Filled 2020-02-16 (×3): qty 250

## 2020-02-16 MED ORDER — ENOXAPARIN SODIUM 40 MG/0.4ML ~~LOC~~ SOLN
40.0000 mg | SUBCUTANEOUS | Status: DC
  Administered 2020-02-16 – 2020-02-17 (×2): 40 mg via SUBCUTANEOUS
  Filled 2020-02-16 (×2): qty 0.4

## 2020-02-16 MED ORDER — ONDANSETRON HCL 4 MG/2ML IJ SOLN: 4.0000 mg | Freq: Four times a day (QID) | INTRAMUSCULAR | Status: DC | PRN

## 2020-02-16 MED ORDER — IOHEXOL 300 MG/ML  SOLN
100.0000 mL | Freq: Once | INTRAMUSCULAR | Status: AC | PRN
  Administered 2020-02-16: 100 mL via INTRAVENOUS

## 2020-02-16 MED ORDER — LACTATED RINGERS IV BOLUS
2000.0000 mL | Freq: Once | INTRAVENOUS | Status: AC
  Administered 2020-02-16: 2000 mL via INTRAVENOUS

## 2020-02-16 MED ORDER — ALBUTEROL SULFATE (2.5 MG/3ML) 0.083% IN NEBU: 2.5000 mg | INHALATION_SOLUTION | RESPIRATORY_TRACT | Status: DC | PRN

## 2020-02-16 MED ORDER — VANCOMYCIN HCL IN DEXTROSE 1-5 GM/200ML-% IV SOLN
1000.0000 mg | Freq: Once | INTRAVENOUS | Status: AC
  Administered 2020-02-16: 1000 mg via INTRAVENOUS
  Filled 2020-02-16: qty 200

## 2020-02-16 MED ORDER — MORPHINE SULFATE (PF) 4 MG/ML IV SOLN
4.0000 mg | Freq: Once | INTRAVENOUS | Status: AC
  Administered 2020-02-16: 4 mg via INTRAVENOUS
  Filled 2020-02-16: qty 1

## 2020-02-16 MED ORDER — SODIUM CHLORIDE 0.9 % IV BOLUS
1000.0000 mL | Freq: Once | INTRAVENOUS | Status: AC
  Administered 2020-02-16: 1000 mL via INTRAVENOUS

## 2020-02-16 MED ORDER — SODIUM CHLORIDE 0.9 % IV SOLN
1.0000 g | Freq: Once | INTRAVENOUS | Status: AC
  Administered 2020-02-16: 1 g via INTRAVENOUS
  Filled 2020-02-16: qty 1

## 2020-02-16 MED ORDER — SODIUM CHLORIDE 0.9 % IV BOLUS
30.0000 mL/kg | Freq: Once | INTRAVENOUS | Status: DC
  Filled 2020-02-16: qty 4200

## 2020-02-16 MED ORDER — ADULT MULTIVITAMIN W/MINERALS CH
1.0000 | ORAL_TABLET | Freq: Every day | ORAL | Status: DC
  Administered 2020-02-16 – 2020-02-17 (×2): 1 via ORAL
  Filled 2020-02-16 (×3): qty 1

## 2020-02-16 MED ORDER — ONDANSETRON HCL 4 MG PO TABS: 4.0000 mg | ORAL_TABLET | Freq: Four times a day (QID) | ORAL | Status: DC | PRN

## 2020-02-16 MED ORDER — SODIUM CHLORIDE 0.9 % IV SOLN
1.0000 g | Freq: Once | INTRAVENOUS | Status: AC
  Administered 2020-02-16: 1 g via INTRAVENOUS
  Filled 2020-02-16: qty 10

## 2020-02-16 MED ORDER — MORPHINE SULFATE (PF) 4 MG/ML IV SOLN: 4.0000 mg | INTRAVENOUS | Status: DC | PRN

## 2020-02-16 MED ORDER — SODIUM CHLORIDE 0.9 % IV SOLN
1.0000 g | Freq: Three times a day (TID) | INTRAVENOUS | Status: DC
  Filled 2020-02-16 (×2): qty 1

## 2020-02-16 NOTE — Progress Notes (Signed)
PROGRESS NOTE        PATIENT DETAILS Name: Brenda [name] Age: 34 y.o. Sex: female Date of Birth: [birth year] Admit Date: 02/15/2020 Admitting Physician Vernelle Emerald, MD PCP:[facility]  Brief Narrative: Patient is a 34 y.o. female with history of menorrhagia/iron deficiency anemia, obesity-who presented with right lower extremity pain/swelling/erythema along with fever-she was found to have sepsis physiology secondary to right lower leg cellulitis and admitted to the hospitalist service.  Significant events: 5/30>> presented with right leg swelling x1 day-sepsis secondary to RLE cellulitis  Significant studies: 5/31>> CT of the right lower extremity: Findings suggestive of diffuse cellulitis involving the ankle and the dorsum of the foot, no evidence of subcutaneous emphysema, or loculated fluid collections.  No evidence of osteomyelitis.  Antimicrobial therapy: Vancomycin: 5/30>> Rocephin: 5/30>> Meropenem: 5/30>> 5/31  Microbiology data: 5/31>> blood cultures: Pending  Procedures : None  Consults: None  DVT Prophylaxis : Prophylactic Lovenox   Subjective: Right lower extremity swelling has decreased-some decrease in erythema as well.  Assessment/Plan: Sepsis secondary to RLE cellulitis: Sepsis physiology improving-blood cultures remain pending.  No crepitus-leg does not appear tense.  Continue vancomycin-stop meropenem-start Rocephin for a few days.  If clinical improvement continues-suspect we can narrow down antimicrobial spectrum over the next few days.  Patient does have a history of prior cellulitis in the right lower extremity-and appears to have some baseline swelling/chronic venous stasis-hence will place on thigh-high TED hose in the next few days.  Have asked patient to elevate right lower extremity is much as possible.  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  Patient currently on hold.-Continue iron supplementation, if hemoglobin significantly decreases-then will require PRBC transfusion.  Mild hyperglycemia: A1c 5.4-unlikely to have diabetes.  History of mild intermittent asthma: No evidence of flare-supportive care  Obesity: Estimated body mass index is 44.3 kg/m as calculated from the following:   Height as of this encounter: 5\' 9"  (1.753 m).   Weight as of this encounter: 136.1 kg.    Diet: Diet Order            Diet regular Room service appropriate? Yes; Fluid consistency: Thin  Diet effective now               Code Status: Full code   Family Communication: None at bedside  Disposition Plan: Status is: Inpatient  Remains inpatient appropriate because:IV treatments appropriate due to intensity of illness or inability to take PO and Inpatient level of care appropriate due to severity of illness   Dispo: The patient is from: Home              Anticipated d/c is to: Home              Anticipated d/c date is: 2 days              Patient currently is not medically stable to d/c.  Barriers to Discharge: Cellulitis with sepsis physiology requiring IV antibiotics  Antimicrobial agents: Anti-infectives (From admission, onward)   Start     Dose/Rate Route Frequency Ordered Stop   02/16/20 1600  vancomycin (VANCOREADY) IVPB 1250 mg/250 mL     1,250 mg 166.7 mL/hr over 90 Minutes Intravenous Every 12 hours 02/16/20 0357  02/16/20 1200  meropenem (MERREM) 1 g in sodium chloride 0.9 % 100 mL IVPB     1 g 200 mL/hr over 30 Minutes Intravenous Every 8 hours 02/16/20 0357     02/16/20 0400  vancomycin (VANCOCIN) IVPB 1000 mg/200 mL premix     1,000 mg 200 mL/hr over 60 Minutes Intravenous  Once 02/16/20 0346 02/16/20 0623   02/16/20 0400  meropenem (MERREM) 1 g in sodium chloride 0.9 % 100 mL IVPB     1 g 200 mL/hr over 30 Minutes Intravenous  Once  02/16/20 0346 02/16/20 0451   02/16/20 0230  vancomycin (VANCOCIN) IVPB 1000 mg/200 mL premix     1,000 mg 200 mL/hr over 60 Minutes Intravenous  Once 02/16/20 0222 02/16/20 0357   02/16/20 0230  cefTRIAXone (ROCEPHIN) 1 g in sodium chloride 0.9 % 100 mL IVPB     1 g 200 mL/hr over 30 Minutes Intravenous  Once 02/16/20 0222 02/16/20 0320       Time spent: 25 minutes-Greater than 50% of this time was spent in counseling, explanation of diagnosis, planning of further management, and coordination of care.  MEDICATIONS: Scheduled Meds: . enoxaparin (LOVENOX) injection  40 mg Subcutaneous Q24H  . ferrous sulfate  325 mg Oral BID WC  . multivitamin with minerals  1 tablet Oral Daily  . norethindrone  5 mg Oral Daily   Continuous Infusions: . lactated ringers 100 mL/hr at 02/16/20 0505  . meropenem (MERREM) IV    . vancomycin     PRN Meds:.acetaminophen **OR** acetaminophen, albuterol, morphine injection **OR** morphine injection, ondansetron **OR** ondansetron (ZOFRAN) IV, polyethylene glycol   PHYSICAL EXAM: Vital signs: Vitals:   02/16/20 0430 02/16/20 0603 02/16/20 0630 02/16/20 0712  BP: (!) 148/71 125/72 139/85   Pulse: 99 88 88   Resp:      Temp:    98.9 F (37.2 C)  TempSrc:    Oral  SpO2: 100% 100% 97%   Weight:      Height:       Filed Weights   02/15/20 2130  Weight: 136.1 kg   Body mass index is 44.3 kg/m.   Gen Exam:Alert awake-not in any distress HEENT:atraumatic, normocephalic Chest: B/L clear to auscultation anteriorly CVS:S1S2 regular Abdomen:soft non tender, non distended Extremities: RLE: Erythema/swelling from dorsum to mid leg-no crepitus-leg not tense.  Mild tenderness.  Sensation intact. Neurology: Non focal Skin: no rash  I have personally reviewed following labs and imaging studies  LABORATORY DATA: CBC: Recent Labs  Lab 02/16/20 0220 02/16/20 0527  WBC 23.1* 18.0*  NEUTROABS 20.1* 14.7*  HGB 8.1* 7.6*  HCT 28.3* 25.8*  MCV  62.6* 62.2*  PLT 328 0000000    Basic Metabolic Panel: Recent Labs  Lab 02/16/20 0220 02/16/20 0527  NA 136 135  K 3.3* 3.5  CL 102 100  CO2 25 23  GLUCOSE 141* 111*  BUN 7 8  CREATININE 0.90 0.80  CALCIUM 8.6* 7.8*    GFR: Estimated Creatinine Clearance: 146 mL/min (by C-G formula based on SCr of 0.8 mg/dL).  Liver Function Tests: Recent Labs  Lab 02/16/20 0527  AST 21  ALT 15  ALKPHOS 48  BILITOT 0.7  PROT 6.5  ALBUMIN 3.2*   No results for input(s): LIPASE, AMYLASE in the last 168 hours. No results for input(s): AMMONIA in the last 168 hours.  Coagulation Profile: Recent Labs  Lab 02/16/20 0527  INR 1.6*    Cardiac Enzymes: No results for input(s): CKTOTAL, CKMB, CKMBINDEX,  TROPONINI in the last 168 hours.  BNP (last 3 results) No results for input(s): PROBNP in the last 8760 hours.  Lipid Profile: No results for input(s): CHOL, HDL, LDLCALC, TRIG, CHOLHDL, LDLDIRECT in the last 72 hours.  Thyroid Function Tests: No results for input(s): TSH, T4TOTAL, FREET4, T3FREE, THYROIDAB in the last 72 hours.  Anemia Panel: Recent Labs    02/16/20 0409  VITAMINB12 248  FOLATE 16.7  TIBC 456*  IRON 6*    Urine analysis:    Component Value Date/Time   COLORURINE RED (A) 10/01/2019 1125   APPEARANCEUR TURBID (A) 10/01/2019 1125   LABSPEC  10/01/2019 1125    TEST NOT REPORTED DUE TO COLOR INTERFERENCE OF URINE PIGMENT   PHURINE  10/01/2019 1125    TEST NOT REPORTED DUE TO COLOR INTERFERENCE OF URINE PIGMENT   GLUCOSEU (A) 10/01/2019 1125    TEST NOT REPORTED DUE TO COLOR INTERFERENCE OF URINE PIGMENT   HGBUR (A) 10/01/2019 1125    TEST NOT REPORTED DUE TO COLOR INTERFERENCE OF URINE PIGMENT   BILIRUBINUR (A) 10/01/2019 1125    TEST NOT REPORTED DUE TO COLOR INTERFERENCE OF URINE PIGMENT   KETONESUR (A) 10/01/2019 1125    TEST NOT REPORTED DUE TO COLOR INTERFERENCE OF URINE PIGMENT   PROTEINUR (A) 10/01/2019 1125    TEST NOT REPORTED DUE TO COLOR  INTERFERENCE OF URINE PIGMENT   UROBILINOGEN 0.2 09/30/2014 1551   NITRITE (A) 10/01/2019 1125    TEST NOT REPORTED DUE TO COLOR INTERFERENCE OF URINE PIGMENT   LEUKOCYTESUR (A) 10/01/2019 1125    TEST NOT REPORTED DUE TO COLOR INTERFERENCE OF URINE PIGMENT    Sepsis Labs: Lactic Acid, Venous    Component Value Date/Time   LATICACIDVEN 2.6 ([place]) 02/16/2020 0409    MICROBIOLOGY: Recent Results (from the past 240 hour(s))  SARS Coronavirus 2 by RT PCR (hospital order, performed in [facility] lab) Nasopharyngeal Nasopharyngeal Swab     Status: None   Collection Time: 02/16/20  2:28 AM   Specimen: Nasopharyngeal Swab  Result Value Ref Range Status   SARS Coronavirus 2 NEGATIVE NEGATIVE Final    Comment: (NOTE) SARS-CoV-2 target nucleic acids are NOT DETECTED. The SARS-CoV-2 RNA is generally detectable in upper and lower respiratory specimens during the acute phase of infection. The lowest concentration of SARS-CoV-2 viral copies this assay can detect is 250 copies / mL. A negative result does not preclude SARS-CoV-2 infection and should not be used as the sole basis for treatment or other patient management decisions.  A negative result may occur with improper specimen collection / handling, submission of specimen other than nasopharyngeal swab, presence of viral mutation(s) within the areas targeted by this assay, and inadequate number of viral copies (<250 copies / mL). A negative result must be combined with clinical observations, patient history, and epidemiological information. Fact Sheet for Patients:   StrictlyIdeas.no Fact Sheet for Healthcare Providers: BankingDealers.co.za This test is not yet approved or cleared  by the [place] FDA and has been authorized for detection and/or diagnosis of SARS-CoV-2 by FDA under an Emergency Use Authorization (EUA).  This EUA will remain in effect (meaning this test can be  used) for the duration of the COVID-19 declaration under Section 564(b)(1) of the Act, 21 U.S.C. section 360bbb-3(b)(1), unless the authorization is terminated or revoked sooner. Performed at [facility], [place] [address] [place], [place] [address]     RADIOLOGY STUDIES/RESULTS: CT ANKLE RIGHT W CONTRAST  Result Date:  02/16/2020 CLINICAL DATA:  Sepsis with right lower extremity cellulitis EXAM: CT OF THE LOWER RIGHT EXTREMITY WITH CONTRAST TECHNIQUE: Multidetector CT imaging of the lower right extremity was performed according to the standard protocol following intravenous contrast administration. COMPARISON:  None. CONTRAST:  165mL OMNIPAQUE IOHEXOL 300 MG/ML  SOLN FINDINGS: Bones/Joint/Cartilage Normal osseous appearance seen throughout. No fracture or dislocation. No areas of cortical destruction. Tiny nonunited ossicles are seen adjacent to the distal fibula. The articular surfaces appear to be maintained. Ligaments Suboptimally assessed by CT. Muscles and Tendons The muscles surrounding the lower extremity appear to be intact without focal atrophy or tear. The visualized portions of the tendons are intact. Soft tissues Diffuse subcutaneous edema and skin thickening seen predominantly around the ankle and dorsum of the foot. No subcutaneous emphysema. No loculated fluid collections are noted. IMPRESSION: Findings suggestive of diffuse cellulitis involving the ankle and dorsum of the foot. No definite evidence of subcutaneous emphysema or loculated fluid collections. No evidence of osteomyelitis. Electronically Signed   By: Prudencio Pair M.D.   On: 02/16/2020 06:28   CT FOOT RIGHT W CONTRAST  Result Date: 02/16/2020 CLINICAL DATA:  Sepsis with right lower extremity cellulitis EXAM: CT OF THE LOWER RIGHT EXTREMITY WITH CONTRAST TECHNIQUE: Multidetector CT imaging of the lower right extremity was performed according to the standard protocol following intravenous contrast administration.  COMPARISON:  None. CONTRAST:  166mL OMNIPAQUE IOHEXOL 300 MG/ML  SOLN FINDINGS: Bones/Joint/Cartilage Normal osseous appearance seen throughout. No fracture or dislocation. No areas of cortical destruction. Tiny nonunited ossicles are seen adjacent to the distal fibula. The articular surfaces appear to be maintained. Ligaments Suboptimally assessed by CT. Muscles and Tendons The muscles surrounding the lower extremity appear to be intact without focal atrophy or tear. The visualized portions of the tendons are intact. Soft tissues Diffuse subcutaneous edema and skin thickening seen predominantly around the ankle and dorsum of the foot. No subcutaneous emphysema. No loculated fluid collections are noted. IMPRESSION: Findings suggestive of diffuse cellulitis involving the ankle and dorsum of the foot. No definite evidence of subcutaneous emphysema or loculated fluid collections. No evidence of osteomyelitis. Electronically Signed   By: Prudencio Pair M.D.   On: 02/16/2020 06:28   CT TIBIA FIBULA RIGHT W CONTRAST  Result Date: 02/16/2020 CLINICAL DATA:  Sepsis with right lower extremity cellulitis EXAM: CT OF THE LOWER RIGHT EXTREMITY WITH CONTRAST TECHNIQUE: Multidetector CT imaging of the lower right extremity was performed according to the standard protocol following intravenous contrast administration. COMPARISON:  None. CONTRAST:  153mL OMNIPAQUE IOHEXOL 300 MG/ML  SOLN FINDINGS: Bones/Joint/Cartilage Normal osseous appearance seen throughout. No fracture or dislocation. No areas of cortical destruction. Tiny nonunited ossicles are seen adjacent to the distal fibula. The articular surfaces appear to be maintained. Ligaments Suboptimally assessed by CT. Muscles and Tendons The muscles surrounding the lower extremity appear to be intact without focal atrophy or tear. The visualized portions of the tendons are intact. Soft tissues Diffuse subcutaneous edema and skin thickening seen predominantly around the ankle  and dorsum of the foot. No subcutaneous emphysema. No loculated fluid collections are noted. IMPRESSION: Findings suggestive of diffuse cellulitis involving the ankle and dorsum of the foot. No definite evidence of subcutaneous emphysema or loculated fluid collections. No evidence of osteomyelitis. Electronically Signed   By: Prudencio Pair M.D.   On: 02/16/2020 06:28   DG Foot Complete Right  Result Date: 02/15/2020 CLINICAL DATA:  Foot swelling, no known injury, initial encounter EXAM: RIGHT FOOT COMPLETE -  3+ VIEW COMPARISON:  12/06/2018 FINDINGS: Generalized soft tissue swelling is noted in the foot and ankle. No underlying bony abnormality is seen. No radiopaque foreign body is noted. Mild flattening of the plantar arch is seen. IMPRESSION: Generalized soft tissue swelling without acute bony abnormality. Electronically Signed   By: Inez Catalina M.D.   On: 02/15/2020 22:13     LOS: 0 days   Oren Binet, MD  [facility]    To contact the attending provider between 7A-7P or the covering provider during after hours 7P-7A, please log into the web site [email or web address] and access using universal [place] password for that web site. If you do not have the password, please call the hospital operator.  02/16/2020, 9:48 AM

## 2020-02-16 NOTE — ED Provider Notes (Signed)
Attestation: Medical screening examination/treatment/procedure(s) were conducted as a shared visit with non-physician practitioner(s) and myself.  I personally evaluated the patient during the encounter.   Briefly, the patient is a 34 y.o. female with h/o recurrent leg cellulitis, here for right leg infection.   Vitals:   02/16/20 0230 02/16/20 0300  BP: 129/66 (!) 115/53  Pulse: 92 89  Resp:    Temp:    SpO2: 100% 100%    CONSTITUTIONAL:  well-appearing, NAD NEURO:  Alert and oriented x 3, no focal deficits EYES:  pupils equal and reactive ENT/NECK:  trachea midline, no JVD CARDIO:  reg rate, reg rhythm, well-perfused PULM:  None labored breathing GI/GU:  Abdomin non-distended MSK/SPINE:  No gross deformities, no edema SKIN:  Right leg erythema from foot to mid calf, with swelling and TTP PSYCH:  Appropriate speech and behavior   EKG Interpretation  Date/Time:    Ventricular Rate:    PR Interval:    QRS Duration:   QT Interval:    QTC Calculation:   R Axis:     Text Interpretation:         Work up consistent with cellulitis. No nec fasc. Will need admission for IV antibiotics given the extent.        Fatima Blank, MD 02/16/20 [phone]

## 2020-02-16 NOTE — Progress Notes (Signed)
Pharmacy Antibiotic Note  Brenda [name] is a 34 y.o. female admitted on 02/15/2020 with cellulitis.  Pharmacy has been consulted for vancomycin and meropenem dosing. Vancomycin 1gm given earlier today  Plan: Vancomycin 1gm IV x 1 for total 2gm load then 1250 mg IV q12 hours Meropenem 2 gm IV x 1 then 1gm IV q8 hours F/u cultures and clinical course  Height: 5\' 9"  (175.3 cm) Weight: 136.1 kg (300 lb) IBW/kg (Calculated) : 66.2  Temp (24hrs), Avg:100.2 F (37.9 C), Min:99.5 F (37.5 C), Max:100.6 F (38.1 C)  Recent Labs  Lab 02/16/20 0220  WBC 23.1*  CREATININE 0.90  LATICACIDVEN 2.7*    Estimated Creatinine Clearance: 129.7 mL/min (by C-G formula based on SCr of 0.9 mg/dL).    Allergies  Allergen Reactions  . Oxycodone Hcl Hives  . Latex Itching  . Terconazole Itching     Thank you for allowing pharmacy to be a part of this patient's care.  Excell Seltzer Poteet 02/16/2020 3:50 AM

## 2020-02-16 NOTE — Progress Notes (Signed)
This RN administered pt's PRN Ibuprofen. Pt stated RLE pain 6/10. No other complaints or needs at this time.

## 2020-02-16 NOTE — H&P (Signed)
History and Physical    Brenda [name] [id number] DOB: Oct 05, 1985 DOA: 02/15/2020  PCP: [facility]  Patient coming from: Home   Chief Complaint:  Chief Complaint  Patient presents with  . Foot Swelling     HPI:    34 year old female with past medical history of iron deficiency anemia secondary to menorrhagia, mild intermittent asthma, obesity and recent COVID-19 infection in March 2021 who presents to [facility] emergency department with complaints of right lower extremity pain.  Patient explains that on Saturday she was out at a cookout all day with friends.  She felt nothing out of the ordinary.  On Sunday morning she woke from sleep with weakness and pain of the right foot and ankle.  Patient explains that in the hours that followed, this pain became severe in intensity, throbbing and burning in quality, radiating proximally, worse with weightbearing or movement of the affected extremity.  The severe pain was associated with rapidly progressive redness and swelling of the affected extremity.  As patient symptoms continue to worsen she began to develop generalized weakness and occasional lightheadedness with poor appetite.  By Sunday evening, the patient had developed a fever at home of 104 F.  Patient eventually presented to  Hospital emergency department for evaluation.  Clinically the patient was diagnosed with a right lower extremity cellulitis.  Patient was initiated on intravenous vancomycin and ceftriaxone.  Patient was hydrated with 1 L of normal saline.  X-ray of the foot revealed no evidence of obvious osteomyelitis.  The hospitalist group was then called to assess the patient for admission the hospital.     Review of Systems: A 10-system review of systems has been performed and all systems are negative with the exception of what is listed in the HPI.    Past Medical History:  Diagnosis Date  . Abnormal Pap smear   . Class 3 severe obesity  due to excess calories with serious comorbidity and body mass index (BMI) of 40.0 to 44.9 in adult (HCC) 02/16/2020  . History of COVID-19 02/16/2020  . Iron deficiency anemia due to chronic blood loss 02/16/2020  . Mild intermittent asthma, uncomplicated 02/16/2020  . Pneumonia     Past Surgical History:  Procedure Laterality Date  . CERVICAL CERCLAGE N/A 10/14/2014   Procedure: CERCLAGE CERVICAL;  Surgeon: Angela Y Roberts, MD;  Location: [facility];  Service: Gynecology;  Laterality: N/A;  . CESAREAN SECTION     20 10  . CESAREAN SECTION N/A 01/29/2015   Procedure: CESAREAN SECTION;  Surgeon: Everett Graff, MD;  Location: [facility];  Service: Obstetrics;  Laterality: N/A;  . FACIAL RECONSTRUCTION SURGERY    . HYSTEROSCOPY WITH RESECTOSCOPE N/A 01/12/2020   Procedure: HYSTEROSCOPY D AND C  WITH RESECTOSCOPE;  Surgeon: Everett Graff, MD;  Location: [place];  Service: Gynecology;  Laterality: N/A;  . LEEP N/A 01/12/2020   Procedure: LOOP ELECTROSURGICAL EXCISION PROCEDURE (LEEP);  Surgeon: Everett Graff, MD;  Location: [place];  Service: Gynecology;  Laterality: N/A;     reports that she has never smoked. She has never used smokeless tobacco. She reports current alcohol use. She reports that she does not use drugs.  Allergies  Allergen Reactions  . Oxycodone Hcl Hives  . Latex Itching  . Terconazole Itching    Family History  Problem Relation Age of Onset  . Stroke Mother   . Diabetes Mother   . Hypertension Mother   . Coronary artery disease Mother   . Hypertension  Father      Prior to Admission medications   Medication Sig Start Date End Date Taking? Authorizing Provider  albuterol (PROVENTIL HFA;VENTOLIN HFA) 108 (90 Base) MCG/ACT inhaler Inhale 1-2 puffs into the lungs every 6 (six) hours as needed for wheezing or shortness of breath. 11/28/18  Yes Tacy Learn, PA-C  ferrous sulfate 325 (65 FE) MG tablet Take 325 mg by mouth 2 (two) times daily with a meal.    Yes [provider]  furosemide (LASIX) 20 MG tablet Take 1 tablet (20 mg total) by mouth daily as needed for fluid. Patient taking differently: Take 20 mg by mouth daily.  12/07/18  Yes Norval Morton, MD  ibuprofen (ADVIL) 600 MG tablet Take 1 tablet (600 mg total) by mouth every 6 (six) hours as needed for mild pain, moderate pain or cramping. 01/12/20  Yes Everett Graff, MD  Multiple Vitamin (MULTIVITAMIN WITH MINERALS) TABS tablet Take 1 tablet by mouth daily.   Yes [provider]  norethindrone (AYGESTIN) 5 MG tablet Take 5 mg by mouth daily.  09/08/19  Yes [provider]  HYDROcodone-acetaminophen (NORCO/VICODIN) 5-325 MG tablet Take 1 tablet by mouth every 6 (six) hours as needed for moderate pain. Patient not taking: Reported on 02/16/2020 01/12/20   Everett Graff, MD    Physical Exam: Vitals:   02/16/20 0230 02/16/20 0300 02/16/20 0330 02/16/20 0337  BP: 129/66 (!) 115/53 126/62   Pulse: 92 89 91   Resp:      Temp:    (!) 100.6 F (38.1 C)  TempSrc:    Oral  SpO2: 100% 100% 100%   Weight:      Height:        Constitutional: Acute alert and oriented x3, patient is in some distress due to pain.  Patient is obese. Skin: Severe redness of the right foot, ankle and right leg that tracks in the distal lower extremity up to just below the knee.  This is associated with induration and warmth without evidence of skin breakdown.  Slightly poor skin turgor otherwise. Eyes: Pupils are equally reactive to light.  Notable conjunctival pallor without scleral icterus. ENMT: Somewhat dry mucous membranes noted.  Posterior pharynx clear of any exudate or lesions.   Neck: normal, supple, no masses, no thyromegaly.  No evidence of jugular venous distension.   Respiratory: clear to auscultation bilaterally, no wheezing, no crackles. Normal respiratory effort. No accessory muscle use.  Cardiovascular: Regular rate and rhythm, no murmurs / rubs / gallops.  Notable distal right lower  extremity edema.  2+ pedal pulses. No carotid bruits.  Chest:   Nontender without crepitus or deformity.   Back:   Nontender without crepitus or deformity. Abdomen: Abdomen is protuberant but soft and nontender.  No evidence of intra-abdominal masses.  Positive bowel sounds noted in all quadrants.   Musculoskeletal: Notable right lower extremity distal edema with associated significant redness warmth and tenderness.  There is not seem to be any worsening of the pain with manipulation of the right ankle joint. Good ROM, no contractures. Normal muscle tone.  Neurologic: CN 2-12 grossly intact. Sensation intact, strength noted to be 5 out of 5 in all 4 extremities.  Patient is following all commands.  Patient is responsive to verbal stimuli.   Psychiatric: Patient presents as a normal mood with appropriate affect.  Patient seems to possess insight as to theircurrent situation.     Labs on Admission: I have personally reviewed following labs and imaging  studies -   CBC: Recent Labs  Lab 02/16/20 0220  WBC 23.1*  NEUTROABS 20.1*  HGB 8.1*  HCT 28.3*  MCV 62.6*  PLT [id number]   Basic Metabolic Panel: Recent Labs  Lab 02/16/20 0220  NA 136  K 3.3*  CL 102  CO2 25  GLUCOSE 141*  BUN 7  CREATININE 0.90  CALCIUM 8.6*   GFR: Estimated Creatinine Clearance: 129.7 mL/min (by C-G formula based on SCr of 0.9 mg/dL). Liver Function Tests: No results for input(s): AST, ALT, ALKPHOS, BILITOT, PROT, ALBUMIN in the last 168 hours. No results for input(s): LIPASE, AMYLASE in the last 168 hours. No results for input(s): AMMONIA in the last 168 hours. Coagulation Profile: No results for input(s): INR, PROTIME in the last 168 hours. Cardiac Enzymes: No results for input(s): CKTOTAL, CKMB, CKMBINDEX, TROPONINI in the last 168 hours. BNP (last 3 results) No results for input(s): PROBNP in the last 8760 hours. HbA1C: No results for input(s): HGBA1C in the last 72 hours. CBG: No results for input(s):  GLUCAP in the last 168 hours. Lipid Profile: No results for input(s): CHOL, HDL, LDLCALC, TRIG, CHOLHDL, LDLDIRECT in the last 72 hours. Thyroid Function Tests: No results for input(s): TSH, T4TOTAL, FREET4, T3FREE, THYROIDAB in the last 72 hours. Anemia Panel: No results for input(s): VITAMINB12, FOLATE, FERRITIN, TIBC, IRON, RETICCTPCT in the last 72 hours. Urine analysis:    Component Value Date/Time   COLORURINE RED (A) 10/01/2019 1125   APPEARANCEUR TURBID (A) 10/01/2019 1125   LABSPEC  10/01/2019 1125    TEST NOT REPORTED DUE TO COLOR INTERFERENCE OF URINE PIGMENT   PHURINE  10/01/2019 1125    TEST NOT REPORTED DUE TO COLOR INTERFERENCE OF URINE PIGMENT   GLUCOSEU (A) 10/01/2019 1125    TEST NOT REPORTED DUE TO COLOR INTERFERENCE OF URINE PIGMENT   HGBUR (A) 10/01/2019 1125    TEST NOT REPORTED DUE TO COLOR INTERFERENCE OF URINE PIGMENT   BILIRUBINUR (A) 10/01/2019 1125    TEST NOT REPORTED DUE TO COLOR INTERFERENCE OF URINE PIGMENT   KETONESUR (A) 10/01/2019 1125    TEST NOT REPORTED DUE TO COLOR INTERFERENCE OF URINE PIGMENT   PROTEINUR (A) 10/01/2019 1125    TEST NOT REPORTED DUE TO COLOR INTERFERENCE OF URINE PIGMENT   UROBILINOGEN 0.2 09/30/2014 1551   NITRITE (A) 10/01/2019 1125    TEST NOT REPORTED DUE TO COLOR INTERFERENCE OF URINE PIGMENT   LEUKOCYTESUR (A) 10/01/2019 1125    TEST NOT REPORTED DUE TO COLOR INTERFERENCE OF URINE PIGMENT    Radiological Exams on Admission - Personally Reviewed: DG Foot Complete Right  Result Date: 02/15/2020 CLINICAL DATA:  Foot swelling, no known injury, initial encounter EXAM: RIGHT FOOT COMPLETE - 3+ VIEW COMPARISON:  12/06/2018 FINDINGS: Generalized soft tissue swelling is noted in the foot and ankle. No underlying bony abnormality is seen. No radiopaque foreign body is noted. Mild flattening of the plantar arch is seen. IMPRESSION: Generalized soft tissue swelling without acute bony abnormality. Electronically Signed   By: Inez Catalina M.D.   On: 02/15/2020 22:13   Telemetry: sinus tachycardia 100BPM  Assessment/Plan Principal Problem:   Sepsis due to cellulitis [facility])   Patient presenting with multiple sirs criteria including tachycardia, fever in excess of 100.5 F and leukocytosis with concurrent lactic acidosis and extensive distal right lower extremity cellulitis with suspected concurrent sepsis.  Extremely rapid and severe progression is concerning.  No evidence of crepitus or bulla formation on examination to suggest necrotizing  fasciitis.  Having said, considering that the patient is developing sepsis and is exhibiting rapid progression of her disease will place patient on extremely broad-spectrum intravenous antibiotic therapy with intravenous meropenem and intravenous vancomycin.  Hydrating patient aggressively with intravenous isotonic fluids  Cultures obtained  Obtaining ultrasound of the right lower extremity to ensure there is no underlying DVT  Obtaining CT imaging of the affected area to ensure there is no obvious evidence of underlying necrotizing infection or osteomyelitis.  Active Problems:   Cellulitis of leg, right   Please see assessment and plan above    Lactic acidosis   Significant lactic acidosis likely secondary to volume depletion and underlying sepsis  Hydrating patient aggressively with intravenous isotonic fluids and broad-spectrum antibiotic therapy  Monitoring serial lactic acid levels to ensure downtrending and resolution    Iron deficiency anemia due to chronic blood loss   Longstanding known history of iron deficiency anemia secondary to menorrhagia  Obtaining iron panel  Obtaining type and screen  No clinical evidence of bleeding on examination  Monitoring hemoglobin and hematocrit with serial CBCs    Hyperglycemia   Notable hyperglycemia on initial chemistry  Considering severity of infection, obtain hemoglobin A1c    Class 3 severe obesity due to  excess calories with serious comorbidity and body mass index (BMI) of 40.0 to 44.9 in adult [facility])   Once patient is clinically improved, will counsel patient on caloric restriction and regular physical activity    Mild intermittent asthma, uncomplicated   As needed bronchodilator therapy for shortness of breath and wheezing.    History of COVID-19   Documented history of COVID-19 infection in March 2021  Since patient has document infection within 90 days of hospitalization no COVID-19 screening is necessary.  Code Status:  Full code Family Communication: Deferred  Status is: Inpatient  Remains inpatient appropriate because:Ongoing diagnostic testing needed not appropriate for outpatient work up, IV treatments appropriate due to intensity of illness or inability to take PO and Inpatient level of care appropriate due to severity of illness   Dispo: The patient is from: Home              Anticipated d/c is to: Home              Anticipated d/c date is: > 3 days              Patient currently is not medically stable to d/c.         Vernelle Emerald MD [facility] Pager [phone]  If 7PM-7AM, please contact night-coverage [email or web address] Use universal [place] password for that web site. If you do not have the password, please call the hospital operator.  02/16/2020, 4:00 AM

## 2020-02-16 NOTE — ED Notes (Signed)
Pt was able to ambulate on right foot however it appeared to be very painful.  Pt's right foot is red, edemes and warm to the touch.  The redness goes 1/2way up her calf.  Pt reports she woke up with it Sunday morning.  She does take lasix for water retention.  Pt alert and oriented

## 2020-02-16 NOTE — Progress Notes (Signed)
VASCULAR LAB PRELIMINARY  PRELIMINARY  PRELIMINARY  PRELIMINARY  Right lower extremity venous duplex completed.    Preliminary report:  See CV proc for preliminary results.  Saad Buhl, RVT 02/16/2020, 2:56 PM

## 2020-02-16 NOTE — ED Provider Notes (Signed)
[facility] EMERGENCY DEPARTMENT Provider Note   CSN: [id number] Arrival date & time: 02/15/20  2122     History Chief Complaint  Patient presents with  . Foot Swelling    Brenda [name] is a 34 y.o. female.  Patient to ED for evaluation of pain, swelling and redness to right foot and lower leg that started yesterday morning (02/15/20). She reports fever at home with Tmax 104 prompting ED visit. She denies nausea, diarrhea, respiratory symptoms, urinary symptoms. No injury or wound to the right foot or leg. She reports similar symptoms in the past with diagnosis of cellulitis. No history of immunocompromise.   The history is provided by the patient. No language interpreter was used.       Past Medical History:  Diagnosis Date  . Abnormal Pap smear   . Anemia   . Asthma    as a child, "outgrew it"  . Pneumonia     Patient Active Problem List   Diagnosis Date Noted  . Cardiomegaly 12/09/2018  . Pelvic lymphadenopathy 12/09/2018  . Sepsis ([place]) 12/06/2018  . Cellulitis of right lower extremity 12/06/2018  . Asthma 12/06/2018  . Nausea vomiting and diarrhea 12/06/2018  . Menorrhagia 05/29/2017  . Irregular periods/menstrual cycles 05/29/2017  . Abnormal Pap smear--ASCUS with HRHPV 02/2017 05/29/2017  . 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 Latex allergy 05/29/2017  . History of 2 cesarean sections 01/29/2015  . Anemia 09/01/2012    Past Surgical History:  Procedure Laterality Date  . CERVICAL CERCLAGE N/A 10/14/2014   Procedure: CERCLAGE CERVICAL;  Surgeon: Delice Lesch, MD;  Location: [place] [facility];  Service: Gynecology;  Laterality: N/A;  . CESAREAN SECTION     2010  . CESAREAN SECTION N/A 01/29/2015   Procedure: CESAREAN SECTION;  Surgeon: Everett Graff, MD;  Location: [facility];  Service: Obstetrics;  Laterality: N/A;  . FACIAL RECONSTRUCTION SURGERY    . HYSTEROSCOPY WITH RESECTOSCOPE N/A 01/12/2020   Procedure: HYSTEROSCOPY D AND C  WITH RESECTOSCOPE;   Surgeon: Everett Graff, MD;  Location: [place];  Service: Gynecology;  Laterality: N/A;  . LEEP N/A 01/12/2020   Procedure: LOOP ELECTROSURGICAL EXCISION PROCEDURE (LEEP);  Surgeon: Everett Graff, MD;  Location: [place];  Service: Gynecology;  Laterality: N/A;     OB History    Gravida  3   Para  2   Term  1   Preterm  1   AB  1   Living  2     SAB  1   TAB      Ectopic      Multiple  0   Live Births  2           Family History  Problem Relation Age of Onset  . Stroke Mother   . Diabetes Mother   . Hypertension Mother   . Coronary artery disease Mother   . Hypertension Father     Social History   Tobacco Use  . Smoking status: Never Smoker  . Smokeless tobacco: Never Used  Substance Use Topics  . Alcohol use: Yes    Comment: occasionally  . Drug use: No    Home Medications Prior to Admission medications   Medication Sig Start Date End Date Taking? Authorizing Provider  albuterol (PROVENTIL HFA;VENTOLIN HFA) 108 (90 Base) MCG/ACT inhaler Inhale 1-2 puffs into the lungs every 6 (six) hours as needed for wheezing or shortness of breath. 11/28/18   Tacy Learn, PA-C  ferrous sulfate 325 (65  FE) MG tablet Take 325 mg by mouth 2 (two) times daily with a meal.     [provider]  furosemide (LASIX) 20 MG tablet Take 1 tablet (20 mg total) by mouth daily as needed for fluid. Patient taking differently: Take 20 mg by mouth daily.  12/07/18   Norval Morton, MD  HYDROcodone-acetaminophen (NORCO/VICODIN) 5-325 MG tablet Take 1 tablet by mouth every 6 (six) hours as needed for moderate pain. 01/12/20   Everett Graff, MD  ibuprofen (ADVIL) 600 MG tablet Take 1 tablet (600 mg total) by mouth every 6 (six) hours as needed for mild pain, moderate pain or cramping. 01/12/20   Everett Graff, MD  Multiple Vitamin (MULTIVITAMIN WITH MINERALS) TABS tablet Take 1 tablet by mouth daily.    [provider]  norethindrone (AYGESTIN) 5 MG tablet Take 5 mg  by mouth daily.  09/08/19   [provider]    Allergies    Oxycodone hcl, Latex, and Terconazole  Review of Systems   Review of Systems  Constitutional: Positive for fever.  HENT: Negative.   Respiratory: Negative.  Negative for cough and shortness of breath.   Cardiovascular: Positive for leg swelling. Negative for chest pain.  Gastrointestinal: Negative.  Negative for abdominal pain, nausea and vomiting.  Genitourinary: Negative.   Musculoskeletal:       See HPI.  Skin: Positive for color change. Negative for wound.  Neurological: Positive for headaches. Negative for weakness.    Physical Exam Updated Vital Signs BP 124/63 (BP Location: Left Arm)   Pulse 95   Temp 100.2 F (37.9 C) (Oral)   Resp 18   Ht 5\' 9"  (1.753 m)   Wt 136.1 kg   SpO2 99%   BMI 44.30 kg/m   Physical Exam Vitals and nursing note reviewed.  Constitutional:      Appearance: She is well-developed.  HENT:     Head: Normocephalic.  Cardiovascular:     Rate and Rhythm: Normal rate and regular rhythm.  Pulmonary:     Effort: Pulmonary effort is normal.     Breath sounds: Normal breath sounds.  Abdominal:     Palpations: Abdomen is soft.     Tenderness: There is no abdominal tenderness. There is no guarding or rebound.  Musculoskeletal:        General: Normal range of motion.     Cervical back: Normal range of motion and neck supple.     Comments: Right foot and distal lower leg markedly swollen, red, warm to the touch. No skin breakdown or wound, including interphalangeal spaces. Generalized tenderness of this area.   Skin:    General: Skin is warm and dry.     Findings: No rash.  Neurological:     Mental Status: She is alert and oriented to person, place, and time.     ED Results / Procedures / Treatments   Labs (all labs ordered are listed, but only abnormal results are displayed) Labs Reviewed  CULTURE, BLOOD (ROUTINE X 2)  CULTURE, BLOOD (ROUTINE X 2)  SARS CORONAVIRUS 2  BY RT PCR (HOSPITAL ORDER, [facility])  CBC WITH DIFFERENTIAL/PLATELET  BASIC METABOLIC PANEL  LACTIC ACID, PLASMA  LACTIC ACID, PLASMA    EKG None  Radiology DG Foot Complete Right  Result Date: 02/15/2020 CLINICAL DATA:  Foot swelling, no known injury, initial encounter EXAM: RIGHT FOOT COMPLETE - 3+ VIEW COMPARISON:  12/06/2018 FINDINGS: Generalized soft tissue swelling is noted in the foot  and ankle. No underlying bony abnormality is seen. No radiopaque foreign body is noted. Mild flattening of the plantar arch is seen. IMPRESSION: Generalized soft tissue swelling without acute bony abnormality. Electronically Signed   By: Inez Catalina M.D.   On: 02/15/2020 22:13    Procedures Procedures (including critical care time)  Medications Ordered in ED Medications  vancomycin (VANCOCIN) IVPB 1000 mg/200 mL premix (has no administration in time range)  cefTRIAXone (ROCEPHIN) 1 g in sodium chloride 0.9 % 100 mL IVPB (has no administration in time range)    ED Course  I have reviewed the triage vital signs and the nursing notes.  Pertinent labs & imaging results that were available during my care of the patient were reviewed by me and considered in my medical decision making (see chart for details).    MDM Rules/Calculators/A&P                      Patient to ED with significant right foot and lower leg swelling and redness, fever that started yesterday. H/O similar sxs with cellulitis in the past requiring admission.   Chart reviewed. Patient admitted 11/2018 with sepsis seconary to right LE cellulitis. Labs pending. Anticipate admission. During previous hospitalization, the patient was treated with Vanc and Rocephin with good result. These abx were ordered at this time.   Febrile and tachycardic on arrival, meeting sepsis criteria. Labs show a Leukocytosis of over 23, lactic acid 2.7. She is overall well appearing, nontoxic.   Discussed admission with Dr.  Cyd Silence who accepts the patient for admission.      Final Clinical Impression(s) / ED Diagnoses Final diagnoses:  None   1. Sepsis 2. Right lower extremity cellulitis  Rx / DC Orders ED Discharge Orders    None       Dennie Bible 02/16/20 0319    Fatima Blank, MD 02/16/20 [phone]

## 2020-02-17 LAB — CBC
HCT: 26.2 % — ABNORMAL LOW (ref 36.0–46.0)
Hemoglobin: 7.4 g/dL — ABNORMAL LOW (ref 12.0–15.0)
MCH: 17.7 pg — ABNORMAL LOW (ref 26.0–34.0)
MCHC: 28.2 g/dL — ABNORMAL LOW (ref 30.0–36.0)
MCV: 62.8 fL — ABNORMAL LOW (ref 80.0–100.0)
Platelets: 267 10*3/uL (ref 150–400)
RBC: 4.17 MIL/uL (ref 3.87–5.11)
RDW: 23.6 % — ABNORMAL HIGH (ref 11.5–15.5)
WBC: 13.2 10*3/uL — ABNORMAL HIGH (ref 4.0–10.5)
nRBC: 0 % (ref 0.0–0.2)

## 2020-02-17 LAB — BASIC METABOLIC PANEL
Anion gap: 7 (ref 5–15)
BUN: 8 mg/dL (ref 6–20)
CO2: 25 mmol/L (ref 22–32)
Calcium: 8.4 mg/dL — ABNORMAL LOW (ref 8.9–10.3)
Chloride: 107 mmol/L (ref 98–111)
Creatinine, Ser: 0.7 mg/dL (ref 0.44–1.00)
GFR calc Af Amer: >60 mL/min (ref >60)
GFR calc non Af Amer: >60 mL/min (ref >60)
Glucose, Bld: 102 mg/dL — ABNORMAL HIGH (ref 70–99)
Potassium: 4.2 mmol/L (ref 3.5–5.1)
Sodium: 139 mmol/L (ref 135–145)

## 2020-02-17 MED ORDER — CEFPODOXIME PROXETIL 200 MG PO TABS: 200.0000 mg | ORAL_TABLET | Freq: Two times a day (BID) | ORAL | 0 refills | Status: AC

## 2020-02-17 NOTE — Progress Notes (Signed)
Educated Pt on Discharge plan and home meds and when next dose of each home med is due

## 2020-02-17 NOTE — Discharge Summary (Addendum)
PATIENT DETAILS Name: Brenda [name] Age: 34 y.o. Sex: female Date of Birth: 1986-03-09 MRN: [id number]. Admitting Physician: Vernelle Emerald, MD PCP:[facility]  Admit Date: 02/15/2020 Discharge date: 02/17/2020  Recommendations for Outpatient Follow-up:  1. Follow up with PCP in 1-2 weeks 2. Please obtain CMP/CBC in one week 3. Follow blood cultures until final  Admitted From:  Home   Disposition: [place]: No  Equipment/Devices: None  Discharge Condition: Stable  CODE STATUS: FULL CODE  Diet recommendation:  Diet Order            Diet general        Diet regular Room service appropriate? Yes; Fluid consistency: Thin  Diet effective now               Brief Summary: Patient is a 34 y.o. female with history of menorrhagia/iron deficiency anemia, obesity-who presented with right lower extremity pain/swelling/erythema along with fever-she was found to have sepsis physiology secondary to right lower leg cellulitis and admitted to the hospitalist service.  Significant events: 5/30>> presented with right leg swelling x1 day-sepsis secondary to RLE cellulitis  Significant studies: 5/31>> CT of the right lower extremity: Findings suggestive of diffuse cellulitis involving the ankle and the dorsum of the foot, no evidence of subcutaneous emphysema, or loculated fluid collections.  No evidence of osteomyelitis. 5/31>> right lower extremity Doppler: No DVT (prelim results)  Antimicrobial therapy: Vantin: 6/2>> Vancomycin: 5/30>> 6/2 Rocephin: 5/30>> 6/2 Meropenem: 5/30>> 5/31  Microbiology data: 5/31>> blood cultures: No growth  Procedures : None  Consults: None  Brief Hospital Course: Sepsis secondary to RLE cellulitis (nonpurulent): Sepsis physiology has resolved-remarkable improvement in the past 24 hours-no erythema in the right lower extremity-very minimal swelling present-almost back to baseline per patient.  Cultures remain  negative.  Discussed with pharmacy-we will transition to [facility] on discharge.  Patient has to wear TED stockings-as she appears to have some amount of chronic venous stasis from repeated episodes of cellulitis in her right lower extremity.   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    Per patient however she is currently on her periods-and was on IV fluids-therefore probably some drop in hemoglobin-however she is asymptomatic-continue iron supplementation and follow with PCP.  Mild hyperglycemia: A1c 5.4-unlikely to have diabetes.  History of mild intermittent asthma: No evidence of flare-supportive care  Obesity: Estimated body mass index is 44.3 kg/m as calculated from the following:   Height as of this encounter: 5\' 9"  (1.753 m).   Weight as of this encounter: 136.1 kg.    Discharge Diagnoses:  Principal Problem:   Sepsis due to cellulitis [facility]) Active Problems:   Cellulitis of leg, right   Iron deficiency anemia due to chronic blood loss   Lactic acidosis   Hyperglycemia   Class 3 severe obesity due to excess calories with serious comorbidity and body mass index (BMI) of 40.0 to 44.9 in adult [facility])   Mild intermittent asthma, uncomplicated   History of COVID-19   Discharge Instructions:  Activity:  As tolerated   Discharge Instructions    Call MD for:  redness, tenderness, or signs of infection (pain, swelling, redness, odor or green/yellow discharge around incision site)   Complete by: As directed    Call MD for:  severe uncontrolled pain   Complete by: As directed    Call MD for:  temperature >  100.4   Complete by: As directed    Diet general   Complete by: As directed    Discharge instructions   Complete by: As directed    Follow with Primary MD  [facility] in 1-2 weeks  Use compression stockings as much as possible to  your right lower extremity.  When sitting or lying down-continue to elevate your right lower extremity.  Please get a complete blood count and chemistry panel checked by your Primary MD at your next visit, and again as instructed by your Primary MD.  Get Medicines reviewed and adjusted: Please take all your medications with you for your next visit with your Primary MD  Laboratory/radiological data: Please request your Primary MD to go over all hospital tests and procedure/radiological results at the follow up, please ask your Primary MD to get all Hospital records sent to his/her office.  In some cases, they will be blood work, cultures and biopsy results pending at the time of your discharge. Please request that your primary care M.D. follows up on these results.  Also Note the following: If you experience worsening of your admission symptoms, develop shortness of breath, life threatening emergency, suicidal or homicidal thoughts you must seek medical attention immediately by calling 911 or calling your MD immediately  if symptoms less severe.  You must read complete instructions/literature along with all the possible adverse reactions/side effects for all the Medicines you take and that have been prescribed to you. Take any new Medicines after you have completely understood and accpet all the possible adverse reactions/side effects.   Do not drive when taking Pain medications or sleeping medications (Benzodaizepines)  Do not take more than prescribed Pain, Sleep and Anxiety Medications. It is not advisable to combine anxiety,sleep and pain medications without talking with your primary care practitioner  Special Instructions: If you have smoked or chewed Tobacco  in the last 2 yrs please stop smoking, stop any regular Alcohol  and or any Recreational drug use.  Wear Seat belts while driving.  Please note: You were cared for by a hospitalist during your hospital stay. Once you are  discharged, your primary care physician will handle any further medical issues. Please note that NO REFILLS for any discharge medications will be authorized once you are discharged, as it is imperative that you return to your primary care physician (or establish a relationship with a primary care physician if you do not have one) for your post hospital discharge needs so that they can reassess your need for medications and monitor your lab values.   Increase activity slowly   Complete by: As directed      Allergies as of 02/17/2020      Reactions   Oxycodone Hcl Hives   Latex Itching   Terconazole Itching      Medication List    STOP taking these medications   HYDROcodone-acetaminophen 5-325 MG tablet Commonly known as: NORCO/VICODIN     TAKE these medications   albuterol 108 (90 Base) MCG/ACT inhaler Commonly known as: VENTOLIN HFA Inhale 1-2 puffs into the lungs every 6 (six) hours as needed for wheezing or shortness of breath.   cefpodoxime 200 MG tablet Commonly known as: VANTIN Take 1 tablet (200 mg total) by mouth 2 (two) times daily for 5 days.   ferrous sulfate 325 (65 FE) MG tablet Take 325 mg by mouth 2 (two) times daily with a meal.   furosemide 20 MG tablet Commonly known as: LASIX Take 1  tablet (20 mg total) by mouth daily as needed for fluid. What changed: when to take this   ibuprofen 600 MG tablet Commonly known as: ADVIL Take 1 tablet (600 mg total) by mouth every 6 (six) hours as needed for mild pain, moderate pain or cramping.   multivitamin with minerals Tabs tablet Take 1 tablet by mouth daily.   norethindrone 5 MG tablet Commonly known as: AYGESTIN Take 5 mg by mouth daily.      Follow-up Information    [facility]. Schedule an appointment as soon as possible for a visit in 1 week(s).   Specialty: Internal Medicine Contact information: [place] [address] [phone]          Allergies  Allergen Reactions     Oxycodone Hcl Hives   Latex Itching   Terconazole Itching     Other Procedures/Studies: CT ANKLE RIGHT W CONTRAST  Result Date: 02/16/2020 CLINICAL DATA:  Sepsis with right lower extremity cellulitis EXAM: CT OF THE LOWER RIGHT EXTREMITY WITH CONTRAST TECHNIQUE: Multidetector CT imaging of the lower right extremity was performed according to the standard protocol following intravenous contrast administration. COMPARISON:  None. CONTRAST:  17mL OMNIPAQUE IOHEXOL 300 MG/ML  SOLN FINDINGS: Bones/Joint/Cartilage Normal osseous appearance seen throughout. No fracture or dislocation. No areas of cortical destruction. Tiny nonunited ossicles are seen adjacent to the distal fibula. The articular surfaces appear to be maintained. Ligaments Suboptimally assessed by CT. Muscles and Tendons The muscles surrounding the lower extremity appear to be intact without focal atrophy or tear. The visualized portions of the tendons are intact. Soft tissues Diffuse subcutaneous edema and skin thickening seen predominantly around the ankle and dorsum of the foot. No subcutaneous emphysema. No loculated fluid collections are noted. IMPRESSION: Findings suggestive of diffuse cellulitis involving the ankle and dorsum of the foot. No definite evidence of subcutaneous emphysema or loculated fluid collections. No evidence of osteomyelitis. Electronically Signed   By: Prudencio Pair M.D.   On: 02/16/2020 06:28   CT FOOT RIGHT W CONTRAST  Result Date: 02/16/2020 CLINICAL DATA:  Sepsis with right lower extremity cellulitis EXAM: CT OF THE LOWER RIGHT EXTREMITY WITH CONTRAST TECHNIQUE: Multidetector CT imaging of the lower right extremity was performed according to the standard protocol following intravenous contrast administration. COMPARISON:  None. CONTRAST:  11mL OMNIPAQUE IOHEXOL 300 MG/ML  SOLN FINDINGS: Bones/Joint/Cartilage Normal osseous appearance seen throughout. No fracture or dislocation. No areas of cortical  destruction. Tiny nonunited ossicles are seen adjacent to the distal fibula. The articular surfaces appear to be maintained. Ligaments Suboptimally assessed by CT. Muscles and Tendons The muscles surrounding the lower extremity appear to be intact without focal atrophy or tear. The visualized portions of the tendons are intact. Soft tissues Diffuse subcutaneous edema and skin thickening seen predominantly around the ankle and dorsum of the foot. No subcutaneous emphysema. No loculated fluid collections are noted. IMPRESSION: Findings suggestive of diffuse cellulitis involving the ankle and dorsum of the foot. No definite evidence of subcutaneous emphysema or loculated fluid collections. No evidence of osteomyelitis. Electronically Signed   By: Prudencio Pair M.D.   On: 02/16/2020 06:28   CT TIBIA FIBULA RIGHT W CONTRAST  Result Date: 02/16/2020 CLINICAL DATA:  Sepsis with right lower extremity cellulitis EXAM: CT OF THE LOWER RIGHT EXTREMITY WITH CONTRAST TECHNIQUE: Multidetector CT imaging of the lower right extremity was performed according to the standard protocol following intravenous contrast administration. COMPARISON:  None. CONTRAST:  159mL OMNIPAQUE IOHEXOL 300 MG/ML  SOLN  FINDINGS: Bones/Joint/Cartilage Normal osseous appearance seen throughout. No fracture or dislocation. No areas of cortical destruction. Tiny nonunited ossicles are seen adjacent to the distal fibula. The articular surfaces appear to be maintained. Ligaments Suboptimally assessed by CT. Muscles and Tendons The muscles surrounding the lower extremity appear to be intact without focal atrophy or tear. The visualized portions of the tendons are intact. Soft tissues Diffuse subcutaneous edema and skin thickening seen predominantly around the ankle and dorsum of the foot. No subcutaneous emphysema. No loculated fluid collections are noted. IMPRESSION: Findings suggestive of diffuse cellulitis involving the ankle and dorsum of the foot. No  definite evidence of subcutaneous emphysema or loculated fluid collections. No evidence of osteomyelitis. Electronically Signed   By: Prudencio Pair M.D.   On: 02/16/2020 06:28   DG Foot Complete Right  Result Date: 02/15/2020 CLINICAL DATA:  Foot swelling, no known injury, initial encounter EXAM: RIGHT FOOT COMPLETE - 3+ VIEW COMPARISON:  12/06/2018 FINDINGS: Generalized soft tissue swelling is noted in the foot and ankle. No underlying bony abnormality is seen. No radiopaque foreign body is noted. Mild flattening of the plantar arch is seen. IMPRESSION: Generalized soft tissue swelling without acute bony abnormality. Electronically Signed   By: Inez Catalina M.D.   On: 02/15/2020 22:13   VAS [place] LOWER EXTREMITY VENOUS (DVT)  Result Date: 02/16/2020  Lower Venous DVTStudy Indications: Edema, and Cellulitis.  Comparison Study: Prior study from 12/07/18 is available for comparison Performing Technologist: Sharion Dove RVS  Examination Guidelines: A complete evaluation includes B-mode imaging, spectral Doppler, color Doppler, and power Doppler as needed of all accessible portions of each vessel. Bilateral testing is considered an integral part of a complete examination. Limited examinations for reoccurring indications may be performed as noted. The reflux portion of the exam is performed with the patient in reverse Trendelenburg.  +---------+---------------+---------+-----------+----------+--------------+  RIGHT     Compressibility Phasicity Spontaneity Properties Thrombus Aging  +---------+---------------+---------+-----------+----------+--------------+  CFV       Full            Yes       Yes                                    +---------+---------------+---------+-----------+----------+--------------+  SFJ       Full                                                             +---------+---------------+---------+-----------+----------+--------------+  FV Prox   Full                                                              +---------+---------------+---------+-----------+----------+--------------+  FV Mid    Full                                                             +---------+---------------+---------+-----------+----------+--------------+  FV Distal Full                                                             +---------+---------------+---------+-----------+----------+--------------+  PFV       Full                                                             +---------+---------------+---------+-----------+----------+--------------+  POP       Full            Yes       Yes                                    +---------+---------------+---------+-----------+----------+--------------+  PTV       Full                                                             +---------+---------------+---------+-----------+----------+--------------+  PERO      Full                                                             +---------+---------------+---------+-----------+----------+--------------+   +----+---------------+---------+-----------+----------+--------------+  LEFT Compressibility Phasicity Spontaneity Properties Thrombus Aging  +----+---------------+---------+-----------+----------+--------------+  CFV  Full            Yes       Yes                                    +----+---------------+---------+-----------+----------+--------------+     Summary: RIGHT: - Findings appear essentially unchanged compared to previous examination. - There is no evidence of deep vein thrombosis in the lower extremity.  - Ultrasound characteristics of enlarged lymph nodes are noted in the groin.  LEFT: - No evidence of common femoral vein obstruction.  *See table(s) above for measurements and observations.    Preliminary      TODAY-DAY OF DISCHARGE:  Subjective:   Brenda [name] today has no headache,no chest abdominal pain,no new weakness tingling or numbness, feels much better wants to go home today.   Objective:   Blood  pressure (!) 128/58, pulse 72, temperature 98.3 F (36.8 C), resp. rate 16, height 5\' 9"  (1.753 m), weight 136.1 kg, SpO2 100 %, unknown if currently breastfeeding.  Intake/Output Summary (Last 24 hours) at 02/17/2020 1041 Last data filed at 02/17/2020 [id number] Gross per 24 hour  Intake 1410.37 ml  Output 1 ml  Net 1409.37 ml   Filed Weights   02/15/20 2130  Weight: 136.1 kg    Exam: Awake Alert, Oriented *3, No new F.N deficits, Normal affect [place].AT,PERRAL Supple  Neck,No JVD, No cervical lymphadenopathy appriciated.  Symmetrical Chest wall movement, Good air movement bilaterally, CTAB RRR,No Gallops,Rubs or new Murmurs, No Parasternal Heave +ve B.Sounds, Abd Soft, Non tender, No organomegaly appriciated, No rebound -guarding or rigidity. No Cyanosis, Clubbing or edema, No new Rash or bruise   PERTINENT RADIOLOGIC STUDIES: CT ANKLE RIGHT W CONTRAST  Result Date: 02/16/2020 CLINICAL DATA:  Sepsis with right lower extremity cellulitis EXAM: CT OF THE LOWER RIGHT EXTREMITY WITH CONTRAST TECHNIQUE: Multidetector CT imaging of the lower right extremity was performed according to the standard protocol following intravenous contrast administration. COMPARISON:  None. CONTRAST:  138mL OMNIPAQUE IOHEXOL 300 MG/ML  SOLN FINDINGS: Bones/Joint/Cartilage Normal osseous appearance seen throughout. No fracture or dislocation. No areas of cortical destruction. Tiny nonunited ossicles are seen adjacent to the distal fibula. The articular surfaces appear to be maintained. Ligaments Suboptimally assessed by CT. Muscles and Tendons The muscles surrounding the lower extremity appear to be intact without focal atrophy or tear. The visualized portions of the tendons are intact. Soft tissues Diffuse subcutaneous edema and skin thickening seen predominantly around the ankle and dorsum of the foot. No subcutaneous emphysema. No loculated fluid collections are noted. IMPRESSION: Findings suggestive of diffuse cellulitis  involving the ankle and dorsum of the foot. No definite evidence of subcutaneous emphysema or loculated fluid collections. No evidence of osteomyelitis. Electronically Signed   By: Prudencio Pair M.D.   On: 02/16/2020 06:28   CT FOOT RIGHT W CONTRAST  Result Date: 02/16/2020 CLINICAL DATA:  Sepsis with right lower extremity cellulitis EXAM: CT OF THE LOWER RIGHT EXTREMITY WITH CONTRAST TECHNIQUE: Multidetector CT imaging of the lower right extremity was performed according to the standard protocol following intravenous contrast administration. COMPARISON:  None. CONTRAST:  160mL OMNIPAQUE IOHEXOL 300 MG/ML  SOLN FINDINGS: Bones/Joint/Cartilage Normal osseous appearance seen throughout. No fracture or dislocation. No areas of cortical destruction. Tiny nonunited ossicles are seen adjacent to the distal fibula. The articular surfaces appear to be maintained. Ligaments Suboptimally assessed by CT. Muscles and Tendons The muscles surrounding the lower extremity appear to be intact without focal atrophy or tear. The visualized portions of the tendons are intact. Soft tissues Diffuse subcutaneous edema and skin thickening seen predominantly around the ankle and dorsum of the foot. No subcutaneous emphysema. No loculated fluid collections are noted. IMPRESSION: Findings suggestive of diffuse cellulitis involving the ankle and dorsum of the foot. No definite evidence of subcutaneous emphysema or loculated fluid collections. No evidence of osteomyelitis. Electronically Signed   By: Prudencio Pair M.D.   On: 02/16/2020 06:28   CT TIBIA FIBULA RIGHT W CONTRAST  Result Date: 02/16/2020 CLINICAL DATA:  Sepsis with right lower extremity cellulitis EXAM: CT OF THE LOWER RIGHT EXTREMITY WITH CONTRAST TECHNIQUE: Multidetector CT imaging of the lower right extremity was performed according to the standard protocol following intravenous contrast administration. COMPARISON:  None. CONTRAST:  176mL OMNIPAQUE IOHEXOL 300 MG/ML  SOLN  FINDINGS: Bones/Joint/Cartilage Normal osseous appearance seen throughout. No fracture or dislocation. No areas of cortical destruction. Tiny nonunited ossicles are seen adjacent to the distal fibula. The articular surfaces appear to be maintained. Ligaments Suboptimally assessed by CT. Muscles and Tendons The muscles surrounding the lower extremity appear to be intact without focal atrophy or tear. The visualized portions of the tendons are intact. Soft tissues Diffuse subcutaneous edema and skin thickening seen predominantly around the ankle and dorsum of the foot. No subcutaneous emphysema. No loculated fluid collections are noted. IMPRESSION: Findings suggestive of diffuse cellulitis  involving the ankle and dorsum of the foot. No definite evidence of subcutaneous emphysema or loculated fluid collections. No evidence of osteomyelitis. Electronically Signed   By: Prudencio Pair M.D.   On: 02/16/2020 06:28   DG Foot Complete Right  Result Date: 02/15/2020 CLINICAL DATA:  Foot swelling, no known injury, initial encounter EXAM: RIGHT FOOT COMPLETE - 3+ VIEW COMPARISON:  12/06/2018 FINDINGS: Generalized soft tissue swelling is noted in the foot and ankle. No underlying bony abnormality is seen. No radiopaque foreign body is noted. Mild flattening of the plantar arch is seen. IMPRESSION: Generalized soft tissue swelling without acute bony abnormality. Electronically Signed   By: Inez Catalina M.D.   On: 02/15/2020 22:13   VAS [place] LOWER EXTREMITY VENOUS (DVT)  Result Date: 02/16/2020  Lower Venous DVTStudy Indications: Edema, and Cellulitis.  Comparison Study: Prior study from 12/07/18 is available for comparison Performing Technologist: Sharion Dove RVS  Examination Guidelines: A complete evaluation includes B-mode imaging, spectral Doppler, color Doppler, and power Doppler as needed of all accessible portions of each vessel. Bilateral testing is considered an integral part of a complete examination. Limited  examinations for reoccurring indications may be performed as noted. The reflux portion of the exam is performed with the patient in reverse Trendelenburg.  +---------+---------------+---------+-----------+----------+--------------+  RIGHT     Compressibility Phasicity Spontaneity Properties Thrombus Aging  +---------+---------------+---------+-----------+----------+--------------+  CFV       Full            Yes       Yes                                    +---------+---------------+---------+-----------+----------+--------------+  SFJ       Full                                                             +---------+---------------+---------+-----------+----------+--------------+  FV Prox   Full                                                             +---------+---------------+---------+-----------+----------+--------------+  FV Mid    Full                                                             +---------+---------------+---------+-----------+----------+--------------+  FV Distal Full                                                             +---------+---------------+---------+-----------+----------+--------------+  PFV       Full                                                             +---------+---------------+---------+-----------+----------+--------------+  POP       Full            Yes       Yes                                    +---------+---------------+---------+-----------+----------+--------------+  PTV       Full                                                             +---------+---------------+---------+-----------+----------+--------------+  PERO      Full                                                             +---------+---------------+---------+-----------+----------+--------------+   +----+---------------+---------+-----------+----------+--------------+  LEFT Compressibility Phasicity Spontaneity Properties Thrombus Aging   +----+---------------+---------+-----------+----------+--------------+  CFV  Full            Yes       Yes                                    +----+---------------+---------+-----------+----------+--------------+     Summary: RIGHT: - Findings appear essentially unchanged compared to previous examination. - There is no evidence of deep vein thrombosis in the lower extremity.  - Ultrasound characteristics of enlarged lymph nodes are noted in the groin.  LEFT: - No evidence of common femoral vein obstruction.  *See table(s) above for measurements and observations.    Preliminary      PERTINENT LAB RESULTS: CBC: Recent Labs    02/16/20 0527 02/17/20 0238  WBC 18.0* 13.2*  HGB 7.6* 7.4*  HCT 25.8* 26.2*  PLT 324 267   CMET CMP     Component Value Date/Time   NA 139 02/17/2020 0238   K 4.2 02/17/2020 0238   CL 107 02/17/2020 0238   CO2 25 02/17/2020 0238   GLUCOSE 102 (H) 02/17/2020 0238   BUN 8 02/17/2020 0238   CREATININE 0.70 02/17/2020 0238   CALCIUM 8.4 (L) 02/17/2020 0238   PROT 6.5 02/16/2020 0527   ALBUMIN 3.2 (L) 02/16/2020 0527   AST 21 02/16/2020 0527   ALT 15 02/16/2020 0527   ALKPHOS 48 02/16/2020 0527   BILITOT 0.7 02/16/2020 0527   GFRNONAA >60 02/17/2020 0238   GFRAA >60 02/17/2020 0238    GFR Estimated Creatinine Clearance: 146 mL/min (by C-G formula based on SCr of 0.7 mg/dL). No results for input(s): LIPASE, AMYLASE in the last 72 hours. No results for input(s): CKTOTAL, CKMB, CKMBINDEX, TROPONINI in the last 72 hours. Invalid input(s): POCBNP No results for input(s): DDIMER in the last 72 hours. Recent Labs    02/16/20 0527  HGBA1C 5.4   No results for input(s): CHOL, HDL, LDLCALC, TRIG, CHOLHDL, LDLDIRECT in the last 72 hours. No results for input(s): TSH, T4TOTAL, T3FREE, THYROIDAB in the last 72 hours.  Invalid input(s): FREET3 Recent Labs    02/16/20 0409  VITAMINB12 248  FOLATE 16.7  TIBC 456*  IRON 6*  Coags: Recent Labs     02/16/20 0527  INR 1.6*   Microbiology: Recent Results (from the past 240 hour(s))  Culture, blood (routine x 2)     Status: None (Preliminary result)   Collection Time: 02/16/20  2:20 AM   Specimen: BLOOD LEFT HAND  Result Value Ref Range Status   Specimen Description BLOOD LEFT HAND  Final   Special Requests   Final    BOTTLES DRAWN AEROBIC AND ANAEROBIC Blood Culture adequate volume   Culture   Final    NO GROWTH 1 DAY Performed at [facility], [address] [place], [place] [address]    Report Status PENDING  Incomplete  Culture, blood (routine x 2)     Status: None (Preliminary result)   Collection Time: 02/16/20  2:25 AM   Specimen: BLOOD RIGHT FOREARM  Result Value Ref Range Status   Specimen Description BLOOD RIGHT FOREARM  Final   Special Requests   Final    BOTTLES DRAWN AEROBIC AND ANAEROBIC Blood Culture adequate volume   Culture   Final    NO GROWTH 1 DAY Performed at [facility], [place] [address] [place], [place] [address]    Report Status PENDING  Incomplete  SARS Coronavirus 2 by RT PCR (hospital order, performed in [facility] hospital lab) Nasopharyngeal Nasopharyngeal Swab     Status: None   Collection Time: 02/16/20  2:28 AM   Specimen: Nasopharyngeal Swab  Result Value Ref Range Status   SARS Coronavirus 2 NEGATIVE NEGATIVE Final    Comment: (NOTE) SARS-CoV-2 target nucleic acids are NOT DETECTED. The SARS-CoV-2 RNA is generally detectable in upper and lower respiratory specimens during the acute phase of infection. The lowest concentration of SARS-CoV-2 viral copies this assay can detect is 250 copies / mL. A negative result does not preclude SARS-CoV-2 infection and should not be used as the sole basis for treatment or other patient management decisions.  A negative result may occur with improper specimen collection / handling, submission of specimen other than nasopharyngeal swab, presence of viral mutation(s) within the areas  targeted by this assay, and inadequate number of viral copies (<250 copies / mL). A negative result must be combined with clinical observations, patient history, and epidemiological information. Fact Sheet for Patients:   StrictlyIdeas.no Fact Sheet for Healthcare Providers: BankingDealers.co.za This test is not yet approved or cleared  by the [place] FDA and has been authorized for detection and/or diagnosis of SARS-CoV-2 by FDA under an Emergency Use Authorization (EUA).  This EUA will remain in effect (meaning this test can be used) for the duration of the COVID-19 declaration under Section 564(b)(1) of the Act, 21 U.S.C. section 360bbb-3(b)(1), unless the authorization is terminated or revoked sooner. Performed at [facility], [place] [address], [place],  [address]     FURTHER DISCHARGE INSTRUCTIONS:  Get Medicines reviewed and adjusted: Please take all your medications with you for your next visit with your Primary MD  Laboratory/radiological data: Please request your Primary MD to go over all hospital tests and procedure/radiological results at the follow up, please ask your Primary MD to get all Hospital records sent to his/her office.  In some cases, they will be blood work, cultures and biopsy results pending at the time of your discharge. Please request that your primary care M.D. goes through all the records of your hospital data and follows up on these results.  Also Note the following: If you experience worsening  of your admission symptoms, develop shortness of breath, life threatening emergency, suicidal or homicidal thoughts you must seek medical attention immediately by calling 911 or calling your MD immediately  if symptoms less severe.  You must read complete instructions/literature along with all the possible adverse reactions/side effects for all the Medicines you take and that have been prescribed to  you. Take any new Medicines after you have completely understood and accpet all the possible adverse reactions/side effects.   Do not drive when taking Pain medications or sleeping medications (Benzodaizepines)  Do not take more than prescribed Pain, Sleep and Anxiety Medications. It is not advisable to combine anxiety,sleep and pain medications without talking with your primary care practitioner  Special Instructions: If you have smoked or chewed Tobacco  in the last 2 yrs please stop smoking, stop any regular Alcohol  and or any Recreational drug use.  Wear Seat belts while driving.  Please note: You were cared for by a hospitalist during your hospital stay. Once you are discharged, your primary care physician will handle any further medical issues. Please note that NO REFILLS for any discharge medications will be authorized once you are discharged, as it is imperative that you return to your primary care physician (or establish a relationship with a primary care physician if you do not have one) for your post hospital discharge needs so that they can reassess your need for medications and monitor your lab values.  Total Time spent coordinating discharge including counseling, education and face to face time equals 35 minutes.  SignedOren Binet 02/17/2020 10:41 AM

## 2020-02-17 NOTE — Progress Notes (Signed)
Dramatic improvement overnight-hardly any erythema in the right lower extremity-still some swelling but per patient swelling is approximately around 90% of her usual baseline.  Afebrile-blood cultures negative-she is anxious to be discharged-we will discharge home with oral antibiotics.  See discharge summary for further details.

## 2020-02-21 LAB — CULTURE, BLOOD (ROUTINE X 2)
Culture: NO GROWTH
Culture: NO GROWTH
Special Requests: ADEQUATE
Special Requests: ADEQUATE

## 2020-03-14 ENCOUNTER — Ambulatory Visit (HOSPITAL_COMMUNITY)
Admission: EM | Admit: 2020-03-14 | Discharge: 2020-03-14 | Disposition: A | Discharge status: Home / Self Care | Payer: Medicaid Other | Attending: Emergency Medicine | Admitting: Emergency Medicine

## 2020-03-14 ENCOUNTER — Other Ambulatory Visit: Payer: Self-pay

## 2020-03-14 ENCOUNTER — Encounter (HOSPITAL_COMMUNITY): Payer: Self-pay | Admitting: Emergency Medicine

## 2020-03-14 DIAGNOSIS — Z3202 Encounter for pregnancy test, result negative: Secondary | ICD-10-CM | POA: Diagnosis not present

## 2020-03-14 DIAGNOSIS — N898 Other specified noninflammatory disorders of vagina: Secondary | ICD-10-CM | POA: Diagnosis not present

## 2020-03-14 LAB — POC URINE PREG, ED: Preg Test, Ur: NEGATIVE

## 2020-03-14 MED ORDER — FLUCONAZOLE 150 MG PO TABS
150.0000 mg | ORAL_TABLET | Freq: Once | ORAL | 0 refills | Status: AC
Start: 2020-03-14 — End: 2020-03-14

## 2020-03-14 NOTE — ED Triage Notes (Signed)
Pt sts white clumpy vaginal discharge that she thinks is yeast with hx of same in past after being on antibiotics

## 2020-03-14 NOTE — Discharge Instructions (Addendum)
Take 1 tab of diflucan today, may repeat in 3-4 days if still having symptoms  We are testing you for Gonorrhea, Chlamydia, Trichomonas, Yeast and Bacterial Vaginosis. We will call you if anything is positive and let you know if you require any further treatment. Please inform partners of any positive results.   Please return if symptoms not improving with treatment, development of fever, nausea, vomiting, abdominal pain.

## 2020-03-14 NOTE — ED Provider Notes (Signed)
[place]    CSN: [id number] Arrival date & time: 03/14/20  1441      History   Chief Complaint Chief Complaint  Patient presents with  . Vaginal Discharge    HPI Brenda [name] is a 34 y.o. female history of fibroids, presenting today for evaluation of discharge.  Patient reports that today she noticed a thick clumpy vaginal discharge.  Reports some mild itching the few days prior.  Believes she may have a yeast infection as she has felt similar and recently was on antibiotics for cellulitis to her lower leg.  She denies any abdominal pain or pelvic pain.  Denies urinary symptoms.  Believes last menstrual cycle was around 5/09.  Does take norethindrone to help regulate cycles.  HPI  Past Medical History:  Diagnosis Date  . Abnormal Pap smear   . Class 3 severe obesity due to excess calories with serious comorbidity and body mass index (BMI) of 40.0 to 44.9 in adult ([place]) 02/16/2020  . History of COVID-19 02/16/2020  . Iron deficiency anemia due to chronic blood loss 02/16/2020  . Mild intermittent asthma, uncomplicated 2/62/0355  . Pneumonia     Patient Active Problem List   Diagnosis Date Noted  . Sepsis due to cellulitis ([place]) 02/16/2020  . Iron deficiency anemia due to chronic blood loss 02/16/2020  . Lactic acidosis 02/16/2020  . Hyperglycemia 02/16/2020  . Class 3 severe obesity due to excess calories with serious comorbidity and body mass index (BMI) of 40.0 to 44.9 in adult ([place]) 02/16/2020  . Mild intermittent asthma, uncomplicated 97/41/6384  . History of COVID-19 02/16/2020  . Cardiomegaly 12/09/2018  . Pelvic lymphadenopathy 12/09/2018  . Sepsis ([place]) 12/06/2018  . Cellulitis of leg, right 12/06/2018  . Asthma 12/06/2018  . Nausea vomiting and diarrhea 12/06/2018  . Menorrhagia 05/29/2017  . Irregular periods/menstrual cycles 05/29/2017  . Abnormal Pap smear--ASCUS with HRHPV 02/2017 05/29/2017  . 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 Latex allergy  05/29/2017  . History of 2 cesarean sections 01/29/2015  . Anemia 09/01/2012    Past Surgical History:  Procedure Laterality Date  . CERVICAL CERCLAGE N/A 10/14/2014   Procedure: CERCLAGE CERVICAL;  Surgeon: Delice Lesch, MD;  Location: [facility];  Service: Gynecology;  Laterality: N/A;  . CESAREAN SECTION     2010  . CESAREAN SECTION N/A 01/29/2015   Procedure: CESAREAN SECTION;  Surgeon: Everett Graff, MD;  Location: [facility];  Service: Obstetrics;  Laterality: N/A;  . FACIAL RECONSTRUCTION SURGERY    . HYSTEROSCOPY WITH RESECTOSCOPE N/A 01/12/2020   Procedure: HYSTEROSCOPY D AND C  WITH RESECTOSCOPE;  Surgeon: Everett Graff, MD;  Location: [place];  Service: Gynecology;  Laterality: N/A;  . LEEP N/A 01/12/2020   Procedure: LOOP ELECTROSURGICAL EXCISION PROCEDURE (LEEP);  Surgeon: Everett Graff, MD;  Location: [place];  Service: Gynecology;  Laterality: N/A;    OB History    Gravida  3   Para  2   Term  1   Preterm  1   AB  1   Living  2     SAB  1   TAB      Ectopic      Multiple  0   Live Births  2            Home Medications    Prior to Admission medications   Medication Sig Start Date End Date Taking? Authorizing Provider  albuterol (PROVENTIL HFA;VENTOLIN HFA) 108 (90 Base) MCG/ACT inhaler Inhale 1-2  puffs into the lungs every 6 (six) hours as needed for wheezing or shortness of breath. 11/28/18   Tacy Learn, PA-C  ferrous sulfate 325 (65 FE) MG tablet Take 325 mg by mouth 2 (two) times daily with a meal.     [provider]  fluconazole (DIFLUCAN) 150 MG tablet Take 1 tablet (150 mg total) by mouth once for 1 dose. 03/14/20 03/14/20  Marchia Diguglielmo C, PA-C  furosemide (LASIX) 20 MG tablet Take 1 tablet (20 mg total) by mouth daily as needed for fluid. Patient taking differently: Take 20 mg by mouth daily.  12/07/18   Norval Morton, MD  ibuprofen (ADVIL) 600 MG tablet Take 1 tablet (600 mg total) by mouth every 6 (six) hours as needed for  mild pain, moderate pain or cramping. 01/12/20   Everett Graff, MD  Multiple Vitamin (MULTIVITAMIN WITH MINERALS) TABS tablet Take 1 tablet by mouth daily.    [provider]  norethindrone (AYGESTIN) 5 MG tablet Take 5 mg by mouth daily.  09/08/19   [provider]    Family History Family History  Problem Relation Age of Onset  . Stroke Mother   . Diabetes Mother   . Hypertension Mother   . Coronary artery disease Mother   . Hypertension Father     Social History Social History   Tobacco Use  . Smoking status: Never Smoker  . Smokeless tobacco: Never Used  Substance Use Topics  . Alcohol use: Yes    Comment: occasionally  . Drug use: No     Allergies   Oxycodone hcl, Latex, and Terconazole   Review of Systems Review of Systems  Constitutional: Negative for fever.  Respiratory: Negative for shortness of breath.   Cardiovascular: Negative for chest pain.  Gastrointestinal: Negative for abdominal pain, diarrhea, nausea and vomiting.  Genitourinary: Positive for vaginal discharge. Negative for dysuria, flank pain, genital sores, hematuria, menstrual problem, vaginal bleeding and vaginal pain.  Musculoskeletal: Negative for back pain.  Skin: Negative for rash.  Neurological: Negative for dizziness, light-headedness and headaches.     Physical Exam Triage Vital Signs ED Triage Vitals [03/14/20 1454]  Enc Vitals Group     BP (!) 146/80     Pulse Rate 87     Resp 18     Temp 98.6 F (37 C)     Temp Source Oral     SpO2 95 %     Weight      Height      Head Circumference      Peak Flow      Pain Score 0     Pain Loc      Pain Edu?      Excl. in [place]?    No data found.  Updated Vital Signs BP (!) 146/80 (BP Location: Right Arm)   Pulse 87   Temp 98.6 F (37 C) (Oral)   Resp 18   SpO2 95%   Visual Acuity Right Eye Distance:   Left Eye Distance:   Bilateral Distance:    Right Eye Near:   Left Eye Near:    Bilateral Near:      Physical Exam Vitals and nursing note reviewed.  Constitutional:      Appearance: She is well-developed.     Comments: No acute distress  HENT:     Head: Normocephalic and atraumatic.     Nose: Nose normal.  Eyes:     Conjunctiva/sclera: Conjunctivae normal.  Cardiovascular:  Rate and Rhythm: Normal rate.  Pulmonary:     Effort: Pulmonary effort is normal. No respiratory distress.  Abdominal:     General: There is no distension.  Musculoskeletal:        General: Normal range of motion.     Cervical back: Neck supple.  Skin:    General: Skin is warm and dry.  Neurological:     Mental Status: She is alert and oriented to person, place, and time.      UC Treatments / Results  Labs (all labs ordered are listed, but only abnormal results are displayed) Labs Reviewed  POC URINE PREG, ED  CERVICOVAGINAL ANCILLARY ONLY    EKG   Radiology No results found.  Procedures Procedures (including critical care time)  Medications Ordered in UC Medications - No data to display  Initial Impression / Assessment and Plan / UC Course  I have reviewed the triage vital signs and the nursing notes.  Pertinent labs & imaging results that were available during my care of the patient were reviewed by me and considered in my medical decision making (see chart for details).   Pregnancy test negative. Empirically treating for yeast today with Diflucan.  Vaginal swab pending for further screening of discharge.Discussed strict return precautions. Patient verbalized understanding and is agreeable with plan.  Final Clinical Impressions(s) / UC Diagnoses   Final diagnoses:  Vaginal discharge     Discharge Instructions     Take 1 tab of diflucan today, may repeat in 3-4 days if still having symptoms  We are testing you for Gonorrhea, Chlamydia, Trichomonas, Yeast and Bacterial Vaginosis. We will call you if anything is positive and let you know if you require any further  treatment. Please inform partners of any positive results.   Please return if symptoms not improving with treatment, development of fever, nausea, vomiting, abdominal pain.     ED Prescriptions    Medication Sig Dispense Auth. Provider   fluconazole (DIFLUCAN) 150 MG tablet Take 1 tablet (150 mg total) by mouth once for 1 dose. 2 tablet Tomisha Reppucci, [place] C, PA-C     PDMP not reviewed this encounter.   Janith Lima, [place] 03/14/20 1536

## 2020-03-15 ENCOUNTER — Telehealth (HOSPITAL_COMMUNITY): Payer: Self-pay | Admitting: Orthopedic Surgery

## 2020-03-15 LAB — CERVICOVAGINAL ANCILLARY ONLY
Bacterial Vaginitis (gardnerella): POSITIVE — AB
Candida Glabrata: NEGATIVE
Candida Vaginitis: POSITIVE — AB
Chlamydia: NEGATIVE
Comment: NEGATIVE
Comment: NEGATIVE
Comment: NEGATIVE
Comment: NEGATIVE
Comment: NEGATIVE
Comment: NORMAL
Neisseria Gonorrhea: NEGATIVE
Trichomonas: NEGATIVE

## 2020-03-15 MED ORDER — METRONIDAZOLE 500 MG PO TABS: 500.0000 mg | ORAL_TABLET | Freq: Two times a day (BID) | ORAL | 0 refills | Status: DC

## 2020-10-18 IMAGING — CR CHEST - 2 VIEW
2 series · 2 of 2 positions shown · non-contrast
Comparison: 12/06/2018

CLINICAL DATA: Per pt she has been having chest pain x 2 days. Was
seen by cardiovascular a few weeks ago dx with enlarged heart. Pt
said she got upset 2 days ago and chest pain started and has not
stopped. No SOB. Pain was in center and shoots through to her back.

EXAM:
CHEST - 2 VIEW

[chest pa]
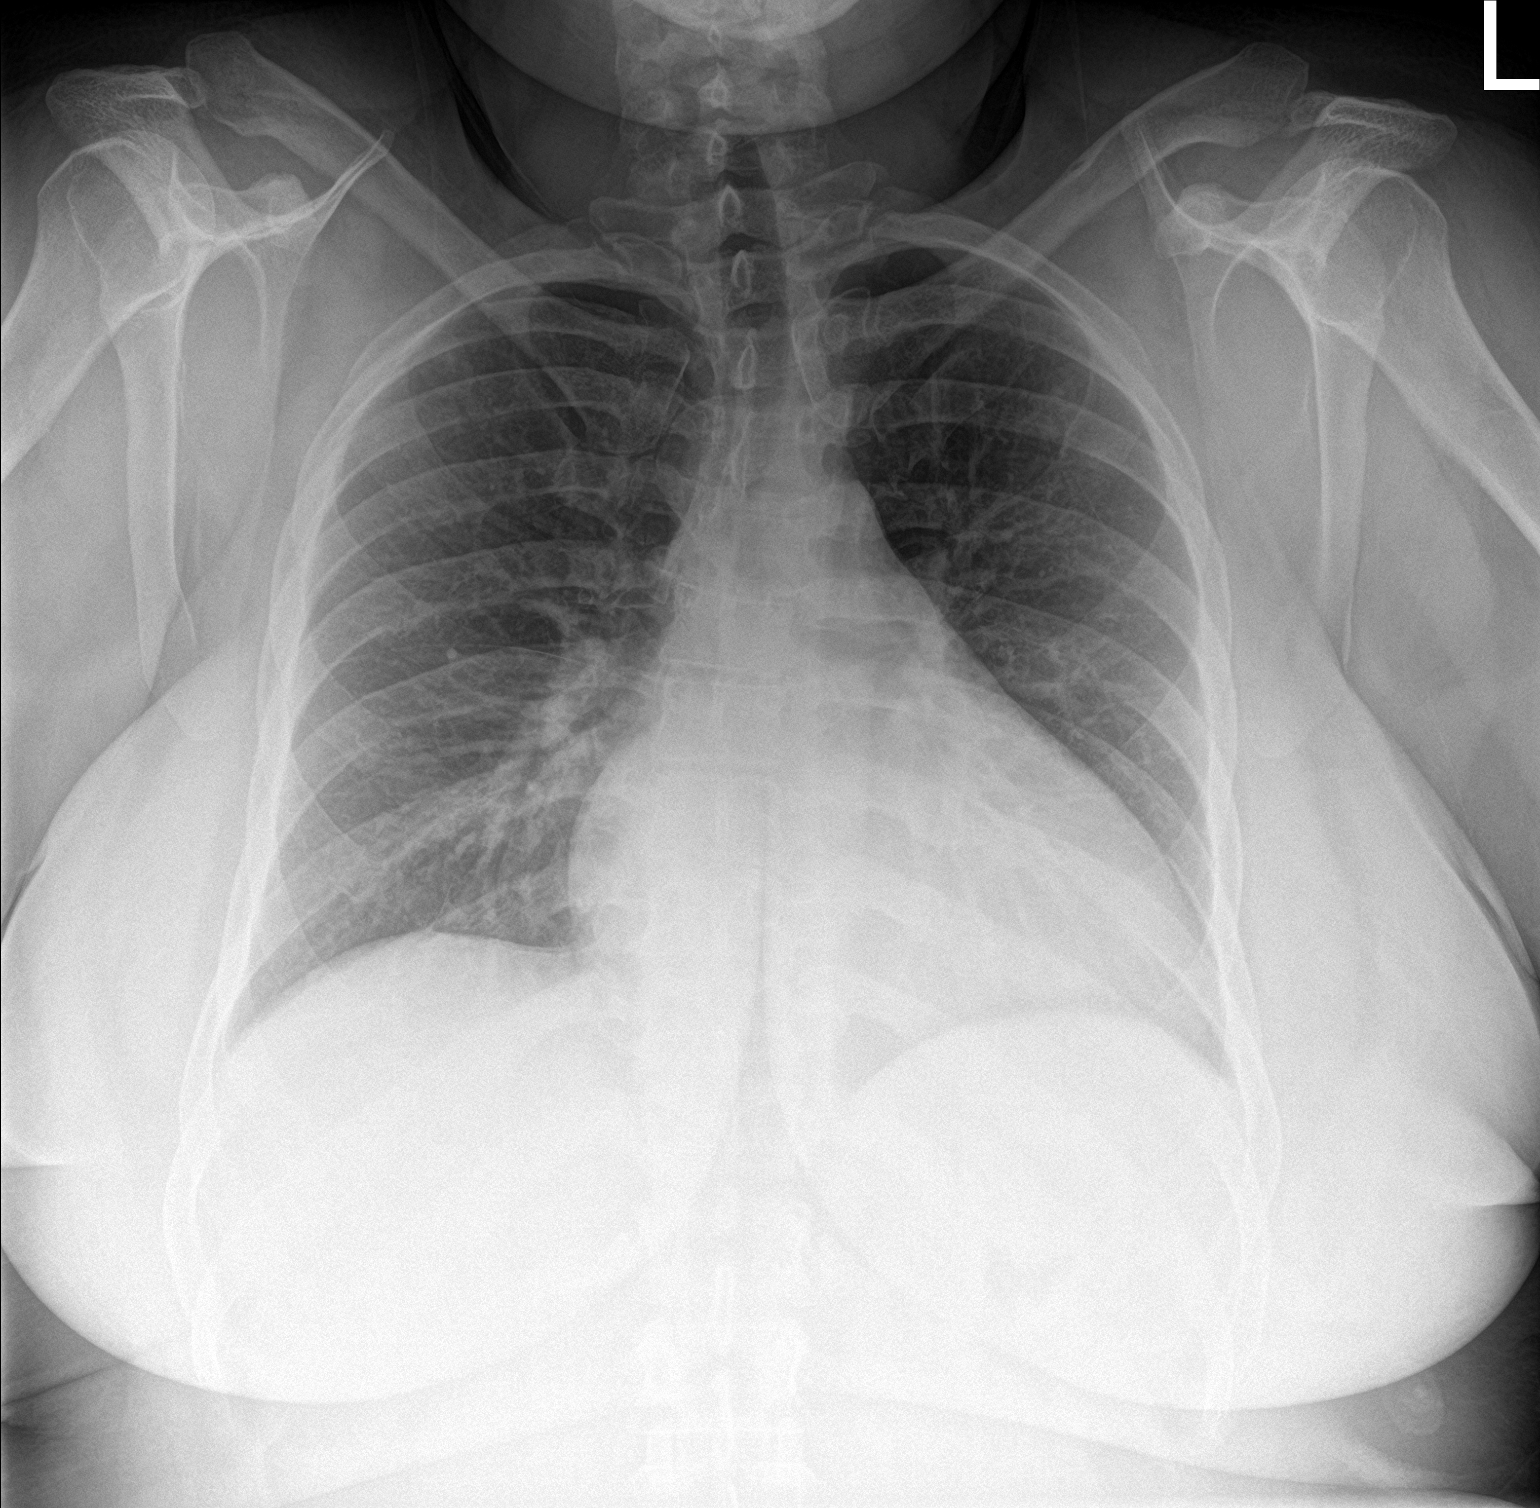

[chest lat]
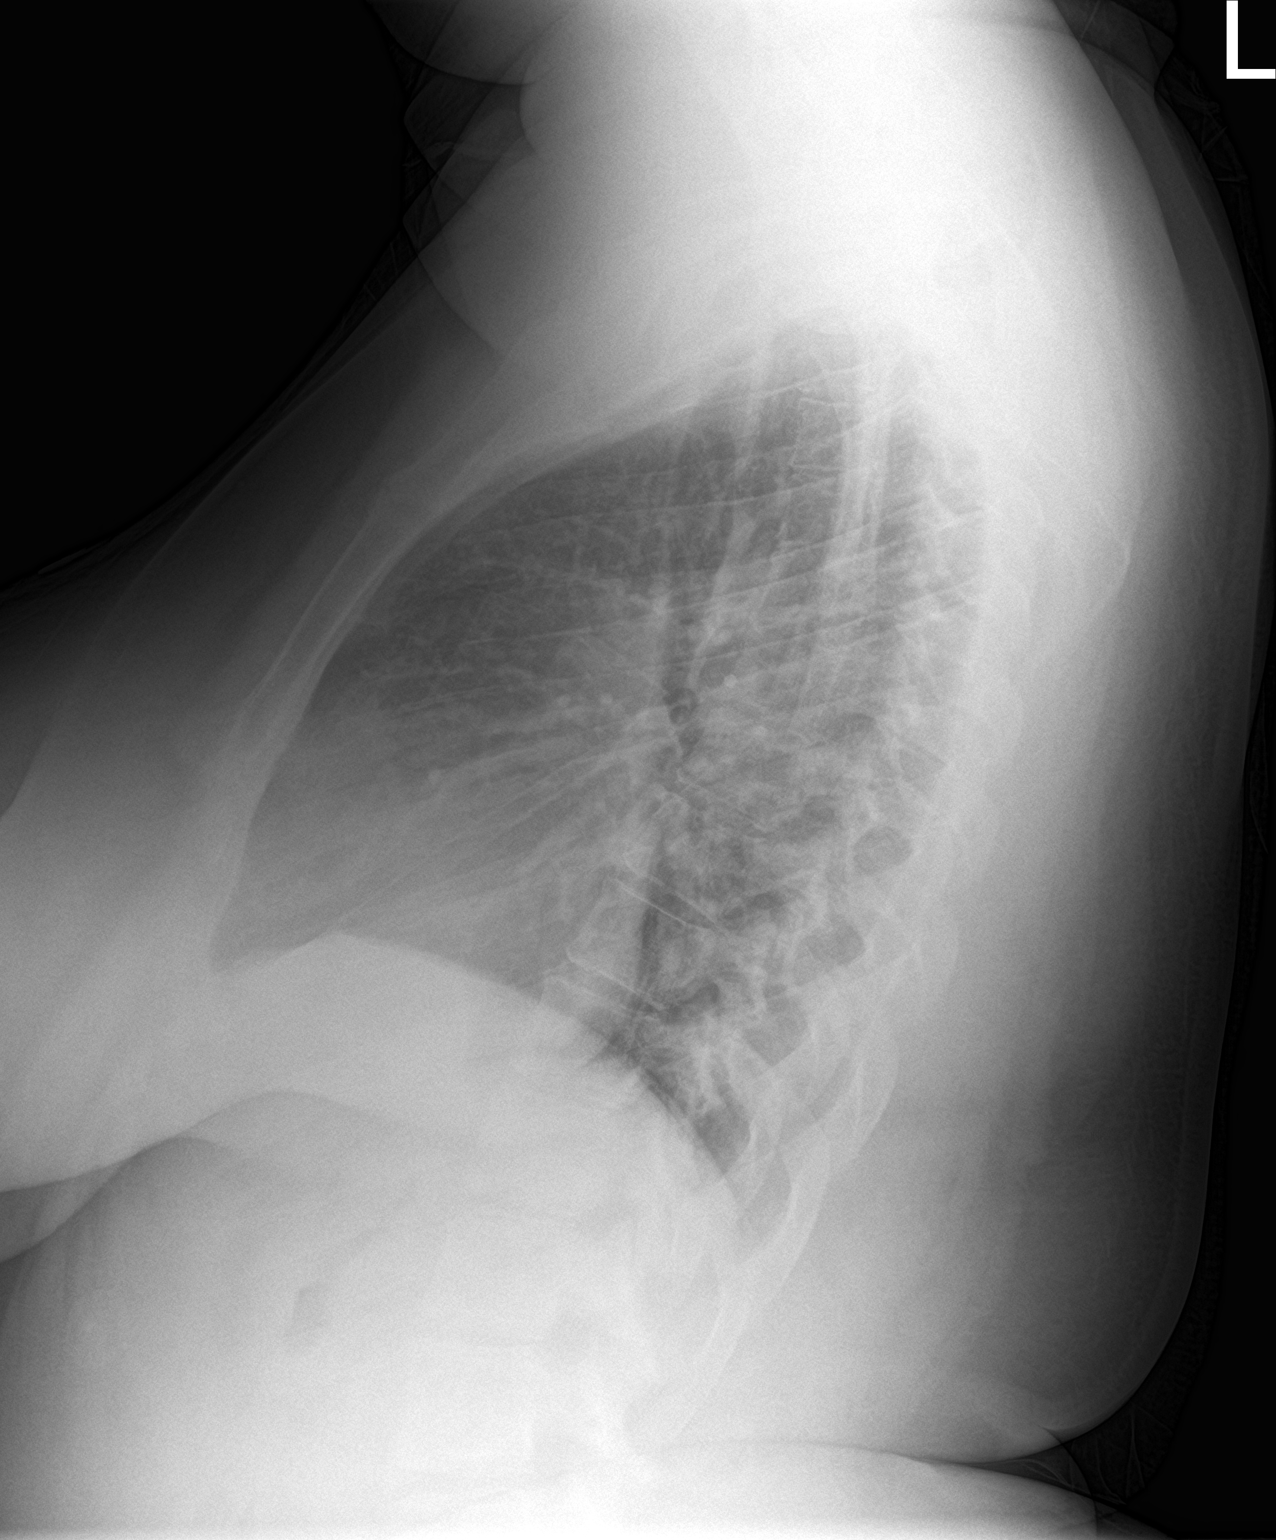

[2 of 2 positions shown; findings below may reference images not displayed]

FINDINGS: Mild enlargement of the cardiopericardial silhouette, stable. Normal
mediastinal and hilar contours.

Clear lungs.  No pleural effusion or pneumothorax.

Skeletal structures are unremarkable.
IMPRESSION: 1. No active cardiopulmonary disease.
2. Mild stable cardiomegaly.

## 2021-07-24 ENCOUNTER — Encounter (HOSPITAL_COMMUNITY): Payer: Self-pay | Admitting: Emergency Medicine

## 2021-07-24 ENCOUNTER — Other Ambulatory Visit: Payer: Self-pay

## 2021-07-24 ENCOUNTER — Emergency Department (HOSPITAL_COMMUNITY): Payer: Managed Care, Other (non HMO)

## 2021-07-24 ENCOUNTER — Emergency Department (HOSPITAL_COMMUNITY)
Admission: EM | Admit: 2021-07-24 | Discharge: 2021-07-24 | Disposition: A | Discharge status: Home / Self Care | Payer: Managed Care, Other (non HMO) | Attending: Emergency Medicine | Admitting: Emergency Medicine

## 2021-07-24 DIAGNOSIS — R059 Cough, unspecified: Secondary | ICD-10-CM | POA: Diagnosis present

## 2021-07-24 DIAGNOSIS — Z91040 Latex allergy status: Secondary | ICD-10-CM | POA: Insufficient documentation

## 2021-07-24 DIAGNOSIS — J4520 Mild intermittent asthma, uncomplicated: Secondary | ICD-10-CM | POA: Insufficient documentation

## 2021-07-24 DIAGNOSIS — Z8616 Personal history of COVID-19: Secondary | ICD-10-CM | POA: Diagnosis not present

## 2021-07-24 DIAGNOSIS — R079 Chest pain, unspecified: Secondary | ICD-10-CM | POA: Insufficient documentation

## 2021-07-24 MED ORDER — OMEPRAZOLE 20 MG PO CPDR
20.0000 mg | DELAYED_RELEASE_CAPSULE | Freq: Every day | ORAL | 0 refills | Status: DC
Start: 2021-07-24 — End: 2022-12-07

## 2021-07-24 MED ORDER — BENZONATATE 100 MG PO CAPS
100.0000 mg | ORAL_CAPSULE | Freq: Two times a day (BID) | ORAL | 0 refills | Status: DC | PRN
Start: 2021-07-24 — End: 2022-12-07

## 2021-07-24 MED ORDER — FLUTICASONE PROPIONATE 50 MCG/ACT NA SUSP
2.0000 | Freq: Every day | NASAL | 0 refills | Status: DC
Start: 2021-07-24 — End: 2022-12-07

## 2021-07-24 NOTE — ED Triage Notes (Signed)
Pt c/o chest pain when coughing x 1 week.

## 2021-07-24 NOTE — ED Provider Notes (Signed)
[facility] EMERGENCY DEPARTMENT Provider Note   CSN: [id number] Arrival date & time: 07/24/21  0053     History Chief Complaint  Patient presents with   Cough    Brenda [name] is a 35 y.o. female.  Patient presents to the emergency department with a chief complaint of cough and chest pain.  She reports having had persistent cough for the past week.  She states that when she coughs it causes her chest to hurt.  She thinks that it may be acid reflux.  She denies any fever or chills.  She states that sometimes her cough is productive.  She denies any other associated symptoms.  Denies successful treatments prior to arrival.  The history is provided by the patient. No language interpreter was used.      Past Medical History:  Diagnosis Date   Abnormal Pap smear    Class 3 severe obesity due to excess calories with serious comorbidity and body mass index (BMI) of 40.0 to 44.9 in adult [facility]) 02/16/2020   History of COVID-19 02/16/2020   Iron deficiency anemia due to chronic blood loss 02/16/2020   Mild intermittent asthma, uncomplicated 11/29/9700   Pneumonia     Patient Active Problem List   Diagnosis Date Noted   Sepsis due to cellulitis ([place]) 02/16/2020   Iron deficiency anemia due to chronic blood loss 02/16/2020   Lactic acidosis 02/16/2020   Hyperglycemia 02/16/2020   Class 3 severe obesity due to excess calories with serious comorbidity and body mass index (BMI) of 40.0 to 44.9 in adult ([place]) 02/16/2020   Mild intermittent asthma, uncomplicated 63/78/5885   History of COVID-19 02/16/2020   Cardiomegaly 12/09/2018   Pelvic lymphadenopathy 12/09/2018   Sepsis ([place]) 12/06/2018   Cellulitis of leg, right 12/06/2018   Asthma 12/06/2018   Nausea vomiting and diarrhea 12/06/2018   Menorrhagia 05/29/2017   Irregular periods/menstrual cycles 05/29/2017   Abnormal Pap smear--ASCUS with HRHPV 02/2017 05/29/2017   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   Latex allergy  05/29/2017   History of 2 cesarean sections 01/29/2015   Anemia 09/01/2012    Past Surgical History:  Procedure Laterality Date   CERVICAL CERCLAGE N/A 10/14/2014   Procedure: CERCLAGE CERVICAL;  Surgeon: Delice Lesch, MD;  Location: [facility];  Service: Gynecology;  Laterality: N/A;   CESAREAN SECTION     2010   CESAREAN SECTION N/A 01/29/2015   Procedure: CESAREAN SECTION;  Surgeon: Everett Graff, MD;  Location: [facility];  Service: Obstetrics;  Laterality: N/A;   FACIAL RECONSTRUCTION SURGERY     HYSTEROSCOPY WITH RESECTOSCOPE N/A 01/12/2020   Procedure: HYSTEROSCOPY D AND C  WITH RESECTOSCOPE;  Surgeon: Everett Graff, MD;  Location: [facility];  Service: Gynecology;  Laterality: N/A;   LEEP N/A 01/12/2020   Procedure: LOOP ELECTROSURGICAL EXCISION PROCEDURE (LEEP);  Surgeon: Everett Graff, MD;  Location: [facility];  Service: Gynecology;  Laterality: N/A;     OB History     Gravida  3   Para  2   Term  1   Preterm  1   AB  1   Living  2      SAB  1   IAB      Ectopic      Multiple  0   Live Births  2           Family History  Problem Relation Age of Onset   Stroke Mother    Diabetes Mother    Hypertension Mother  Coronary artery disease Mother    Hypertension Father     Social History   Tobacco Use   Smoking status: Never   Smokeless tobacco: Never  Substance Use Topics   Alcohol use: Yes    Comment: occasionally   Drug use: No    Home Medications Prior to Admission medications   Medication Sig Start Date End Date Taking? Authorizing Provider  albuterol (PROVENTIL HFA;VENTOLIN HFA) 108 (90 Base) MCG/ACT inhaler Inhale 1-2 puffs into the lungs every 6 (six) hours as needed for wheezing or shortness of breath. 11/28/18   Tacy Learn, PA-C  ferrous sulfate 325 (65 FE) MG tablet Take 325 mg by mouth 2 (two) times daily with a meal.     [provider]  furosemide (LASIX) 20 MG tablet Take 1 tablet (20 mg total) by mouth daily as needed  for fluid. Patient taking differently: Take 20 mg by mouth daily.  12/07/18   Norval Morton, MD  ibuprofen (ADVIL) 600 MG tablet Take 1 tablet (600 mg total) by mouth every 6 (six) hours as needed for mild pain, moderate pain or cramping. 01/12/20   Everett Graff, MD  metroNIDAZOLE (FLAGYL) 500 MG tablet Take 1 tablet (500 mg total) by mouth 2 (two) times daily. 03/15/20   LampteyMyrene Galas, MD  Multiple Vitamin (MULTIVITAMIN WITH MINERALS) TABS tablet Take 1 tablet by mouth daily.    [provider]  norethindrone (AYGESTIN) 5 MG tablet Take 5 mg by mouth daily.  09/08/19   [provider]    Allergies    Oxycodone hcl, Latex, and Terconazole  Review of Systems   Review of Systems  All other systems reviewed and are negative.  Physical Exam Updated Vital Signs BP (!) 163/95 (BP Location: Right Arm)   Pulse 82   Temp 98.4 F (36.9 C) (Oral)   Resp 20   SpO2 94%   Physical Exam Vitals and nursing note reviewed.  Constitutional:      General: She is not in acute distress.    Appearance: She is well-developed.  HENT:     Head: Normocephalic and atraumatic.  Eyes:     Conjunctiva/sclera: Conjunctivae normal.  Cardiovascular:     Rate and Rhythm: Normal rate and regular rhythm.     Heart sounds: No murmur heard. Pulmonary:     Effort: Pulmonary effort is normal. No respiratory distress.     Breath sounds: Normal breath sounds.  Abdominal:     Palpations: Abdomen is soft.     Tenderness: There is no abdominal tenderness.  Musculoskeletal:        General: Normal range of motion.     Cervical back: Neck supple.  Skin:    General: Skin is warm and dry.  Neurological:     Mental Status: She is alert and oriented to person, place, and time.  Psychiatric:        Mood and Affect: Mood normal.        Behavior: Behavior normal.    ED Results / Procedures / Treatments   Labs (all labs ordered are listed, but only abnormal results are displayed) Labs  Reviewed - No data to display  EKG None  Radiology No results found.  Procedures Procedures   Medications Ordered in ED Medications - No data to display  ED Course  I have reviewed the triage vital signs and the nursing notes.  Pertinent labs & imaging results that were available during my care of the patient were  reviewed by me and considered in my medical decision making (see chart for details).    MDM Rules/Calculators/A&P                          Patient here with cough x1 week.  States it is productive.  Also reports some chest pain while coughing.  She thinks it may be acid reflux.  She has not had a fever or other URI symptoms.  Chest x-ray is negative for infiltrate.  We will treat with Ladona Ridgel, will also give patient Flonase and omeprazole.  Recommend PCP follow-up.  Return precautions discussed. Final Clinical Impression(s) / ED Diagnoses Final diagnoses:  Cough, unspecified type    Rx / DC Orders ED Discharge Orders     None        Montine Circle, PA-C 07/24/21 [place], [place], DO 07/24/21 [phone]

## 2021-07-24 NOTE — ED Notes (Signed)
Pt d/c during downtime.

## 2021-07-28 ENCOUNTER — Other Ambulatory Visit: Payer: Self-pay | Admitting: Internal Medicine

## 2021-07-29 LAB — COMPLETE METABOLIC PANEL WITH GFR
AG Ratio: 1.3 (calc) (ref 1.0–2.5)
ALT: 11 U/L (ref 6–29)
AST: 15 U/L (ref 10–30)
Albumin: 4.4 g/dL (ref 3.6–5.1)
Alkaline phosphatase (APISO): 67 U/L (ref 31–125)
BUN: 11 mg/dL (ref 7–25)
CO2: 26 mmol/L (ref 20–32)
Calcium: 9.3 mg/dL (ref 8.6–10.2)
Chloride: 100 mmol/L (ref 98–110)
Creat: 0.72 mg/dL (ref 0.50–0.97)
Globulin: 3.3 g/dL (calc) (ref 1.9–3.7)
Glucose, Bld: 83 mg/dL (ref 65–99)
Potassium: 3.8 mmol/L (ref 3.5–5.3)
Sodium: 138 mmol/L (ref 135–146)
Total Bilirubin: 0.2 mg/dL (ref 0.2–1.2)
Total Protein: 7.7 g/dL (ref 6.1–8.1)
eGFR: 110 mL/min/{1.73_m2} (ref >=60)

## 2021-07-29 LAB — CBC
HCT: 33.7 % — ABNORMAL LOW (ref 35.0–45.0)
Hemoglobin: 9.3 g/dL — ABNORMAL LOW (ref 11.7–15.5)
MCH: 16.3 pg — ABNORMAL LOW (ref 27.0–33.0)
MCHC: 27.6 g/dL — ABNORMAL LOW (ref 32.0–36.0)
MCV: 59.2 fL — ABNORMAL LOW (ref 80.0–100.0)
Platelets: 317 10*3/uL (ref 140–400)
RBC: 5.69 10*6/uL — ABNORMAL HIGH (ref 3.80–5.10)
RDW: 22.1 % — ABNORMAL HIGH (ref 11.0–15.0)
WBC: 9.5 10*3/uL (ref 3.8–10.8)

## 2021-07-29 LAB — LIPID PANEL
Cholesterol: 163 mg/dL (ref <200)
HDL: 49 mg/dL — ABNORMAL LOW (ref >=50)
LDL Cholesterol (Calc): 89 mg/dL (calc)
Non-HDL Cholesterol (Calc): 114 mg/dL (calc) (ref <130)
Total CHOL/HDL Ratio: 3.3 (calc) (ref <5.0)
Triglycerides: 152 mg/dL — ABNORMAL HIGH (ref <150)

## 2021-07-29 LAB — VITAMIN D 25 HYDROXY (VIT D DEFICIENCY, FRACTURES): Vit D, 25-Hydroxy: 17 ng/mL — ABNORMAL LOW (ref 30–100)

## 2021-07-29 LAB — TSH: TSH: 1.1 mIU/L

## 2022-04-12 ENCOUNTER — Emergency Department (HOSPITAL_COMMUNITY)
Admission: EM | Admit: 2022-04-12 | Discharge: 2022-04-12 | Discharge status: Against Medical Advice | Payer: Managed Care, Other (non HMO) | Attending: Emergency Medicine | Admitting: Emergency Medicine

## 2022-04-12 ENCOUNTER — Emergency Department (HOSPITAL_COMMUNITY): Payer: Managed Care, Other (non HMO)

## 2022-04-12 ENCOUNTER — Other Ambulatory Visit: Payer: Self-pay

## 2022-04-12 DIAGNOSIS — M7989 Other specified soft tissue disorders: Secondary | ICD-10-CM | POA: Diagnosis not present

## 2022-04-12 DIAGNOSIS — R059 Cough, unspecified: Secondary | ICD-10-CM | POA: Diagnosis not present

## 2022-04-12 DIAGNOSIS — Z5321 Procedure and treatment not carried out due to patient leaving prior to being seen by health care provider: Secondary | ICD-10-CM | POA: Diagnosis not present

## 2022-04-12 DIAGNOSIS — R0602 Shortness of breath: Secondary | ICD-10-CM | POA: Diagnosis not present

## 2022-04-12 DIAGNOSIS — R072 Precordial pain: Secondary | ICD-10-CM | POA: Insufficient documentation

## 2022-04-12 LAB — CBC
HCT: 34.7 % — ABNORMAL LOW (ref 36.0–46.0)
Hemoglobin: 9.7 g/dL — ABNORMAL LOW (ref 12.0–15.0)
MCH: 16 pg — ABNORMAL LOW (ref 26.0–34.0)
MCHC: 28 g/dL — ABNORMAL LOW (ref 30.0–36.0)
MCV: 57.3 fL — ABNORMAL LOW (ref 80.0–100.0)
Platelets: 350 10*3/uL (ref 150–400)
RBC: 6.06 MIL/uL — ABNORMAL HIGH (ref 3.87–5.11)
RDW: 22.9 % — ABNORMAL HIGH (ref 11.5–15.5)
WBC: 10.8 10*3/uL — ABNORMAL HIGH (ref 4.0–10.5)
nRBC: 0 % (ref 0.0–0.2)

## 2022-04-12 LAB — BASIC METABOLIC PANEL
Anion gap: 11 (ref 5–15)
BUN: 7 mg/dL (ref 6–20)
CO2: 24 mmol/L (ref 22–32)
Calcium: 9.3 mg/dL (ref 8.9–10.3)
Chloride: 101 mmol/L (ref 98–111)
Creatinine, Ser: 0.65 mg/dL (ref 0.44–1.00)
GFR, Estimated: >60 mL/min (ref >60)
Glucose, Bld: 91 mg/dL (ref 70–99)
Potassium: 3.7 mmol/L (ref 3.5–5.1)
Sodium: 136 mmol/L (ref 135–145)

## 2022-04-12 LAB — TROPONIN I (HIGH SENSITIVITY): Troponin I (High Sensitivity): 7 ng/L (ref <18)

## 2022-04-12 NOTE — ED Provider Triage Note (Signed)
Emergency Medicine Provider Triage Evaluation Note  Brenda [name] , a 36 y.o. female  was evaluated in triage.  Pt complains of chest pain. States that same began on Saturday and has been persistent since then.  Same is located substernally and is pleuritic in nature.  She also endorses an occasional cough.  Denies any fevers, chills, or congestion.  Does endorse some shortness of breath.  Does also have some right leg swelling but states that it has been like that for many years and she is on Lasix for it but has not been taking it.  Endorses history of cardiomegaly but no other heart problems.  Review of Systems  Positive:  Negative:   Physical Exam  BP (!) 183/105   Pulse 76   Temp 99 F (37.2 C) (Oral)   Resp 16   SpO2 100%  Gen:   Awake, no distress   Resp:  Normal effort MSK:   Moves extremities without difficulty  Other:  RLE edema consistent with baseline  Medical Decision Making  Medically screening exam initiated at 8:20 PM.  Appropriate orders placed.  Brenda [name] was informed that the remainder of the evaluation will be completed by another provider, this initial triage assessment does not replace that evaluation, and the importance of remaining in the ED until their evaluation is complete.     Bud Face, PA-C 04/12/22 2023

## 2022-04-12 NOTE — ED Triage Notes (Signed)
Pt c/o intermittent centralized cp ongoing since Saturday. CP associated SOB and non-productive cough. CP worsens with deep breaths and movement.   No fevers/chills. No sick exposure.

## 2022-04-12 NOTE — ED Notes (Signed)
Patient states the wait is too long and she is leaving 

## 2022-11-14 ENCOUNTER — Other Ambulatory Visit: Payer: Self-pay | Admitting: Internal Medicine

## 2022-11-15 LAB — C. TRACHOMATIS/N. GONORRHOEAE RNA
C. trachomatis RNA, TMA: NOT DETECTED
N. gonorrhoeae RNA, TMA: NOT DETECTED

## 2022-11-19 ENCOUNTER — Other Ambulatory Visit: Payer: Self-pay

## 2022-11-19 ENCOUNTER — Emergency Department (HOSPITAL_COMMUNITY): Payer: Managed Care, Other (non HMO)

## 2022-11-19 ENCOUNTER — Emergency Department (HOSPITAL_COMMUNITY)
Admission: EM | Admit: 2022-11-19 | Discharge: 2022-11-19 | Disposition: A | Discharge status: Home / Self Care | Payer: Managed Care, Other (non HMO) | Attending: Emergency Medicine | Admitting: Emergency Medicine

## 2022-11-19 ENCOUNTER — Encounter (HOSPITAL_COMMUNITY): Payer: Self-pay

## 2022-11-19 DIAGNOSIS — Z8616 Personal history of COVID-19: Secondary | ICD-10-CM | POA: Insufficient documentation

## 2022-11-19 DIAGNOSIS — I10 Essential (primary) hypertension: Secondary | ICD-10-CM | POA: Insufficient documentation

## 2022-11-19 DIAGNOSIS — J4520 Mild intermittent asthma, uncomplicated: Secondary | ICD-10-CM | POA: Insufficient documentation

## 2022-11-19 DIAGNOSIS — Z91040 Latex allergy status: Secondary | ICD-10-CM | POA: Diagnosis not present

## 2022-11-19 DIAGNOSIS — R079 Chest pain, unspecified: Secondary | ICD-10-CM

## 2022-11-19 DIAGNOSIS — Z7951 Long term (current) use of inhaled steroids: Secondary | ICD-10-CM | POA: Diagnosis not present

## 2022-11-19 DIAGNOSIS — R0789 Other chest pain: Secondary | ICD-10-CM | POA: Insufficient documentation

## 2022-11-19 HISTORY — DX: Essential (primary) hypertension: I10

## 2022-11-19 LAB — CBC
HCT: 35 % — ABNORMAL LOW (ref 36.0–46.0)
Hemoglobin: 9.5 g/dL — ABNORMAL LOW (ref 12.0–15.0)
MCH: 15.8 pg — ABNORMAL LOW (ref 26.0–34.0)
MCHC: 27.1 g/dL — ABNORMAL LOW (ref 30.0–36.0)
MCV: 58.2 fL — ABNORMAL LOW (ref 80.0–100.0)
Platelets: 265 10*3/uL (ref 150–400)
RBC: 6.01 MIL/uL — ABNORMAL HIGH (ref 3.87–5.11)
RDW: 23.8 % — ABNORMAL HIGH (ref 11.5–15.5)
WBC: 12.1 10*3/uL — ABNORMAL HIGH (ref 4.0–10.5)
nRBC: 0 % (ref 0.0–0.2)

## 2022-11-19 LAB — TROPONIN I (HIGH SENSITIVITY)
Troponin I (High Sensitivity): 7 ng/L (ref <18)
Troponin I (High Sensitivity): 6 ng/L (ref <18)

## 2022-11-19 LAB — D-DIMER, QUANTITATIVE: D-Dimer, Quant: 0.94 ug/mL-FEU — ABNORMAL HIGH (ref 0.00–0.50)

## 2022-11-19 LAB — BASIC METABOLIC PANEL
Anion gap: 9 (ref 5–15)
BUN: 10 mg/dL (ref 6–20)
CO2: 24 mmol/L (ref 22–32)
Calcium: 8.7 mg/dL — ABNORMAL LOW (ref 8.9–10.3)
Chloride: 101 mmol/L (ref 98–111)
Creatinine, Ser: 0.75 mg/dL (ref 0.44–1.00)
GFR, Estimated: >60 mL/min (ref >60)
Glucose, Bld: 100 mg/dL — ABNORMAL HIGH (ref 70–99)
Potassium: 3.9 mmol/L (ref 3.5–5.1)
Sodium: 134 mmol/L — ABNORMAL LOW (ref 135–145)

## 2022-11-19 MED ORDER — SODIUM CHLORIDE (PF) 0.9 % IJ SOLN
INTRAMUSCULAR | Status: AC
  Filled 2022-11-19: qty 50

## 2022-11-19 MED ORDER — IOHEXOL 350 MG/ML SOLN
100.0000 mL | Freq: Once | INTRAVENOUS | Status: AC | PRN
  Administered 2022-11-19: 100 mL via INTRAVENOUS

## 2022-11-19 NOTE — ED Triage Notes (Signed)
States that last night she started having pain in her chest, with SOB

## 2022-11-19 NOTE — ED Provider Triage Note (Cosign Needed Addendum)
Emergency Medicine Provider Triage Evaluation Note  MALEIKA RENTON , a 37 y.o. female  was evaluated in triage.  Pt complains of chest pain that began last night.  Patient reports that she has never previously had chest pain similar to this.  She thought that she was maybe initially having some acid reflux and has been taking Tums without any significant improvement in symptoms.  Patient denies any significant past cardiac history.  Denies any nausea, vomiting, diaphoresis.  No upset stomach, fever, sick contacts.  Not currently on blood thinners.  Did remark that she takes water pills daily for fluid overload but denies any history of CHF.  Review of Systems  Positive: As above Negative: As above  Physical Exam  BP (!) 152/94   Pulse 88   Temp 99.4 F (37.4 C)   Resp 18   Ht '5\' 9"'$  (1.753 m)   Wt 133.4 kg   SpO2 100%   BMI 43.42 kg/m  Gen:   Awake, no distress   Resp:  Normal effort  MSK:   Moves extremities without difficulty  Other:    Medical Decision Making  Medically screening exam initiated at 7:17 PM.  Appropriate orders placed.  PATIANCE SOBCZYK was informed that the remainder of the evaluation will be completed by another provider, this initial triage assessment does not replace that evaluation, and the importance of remaining in the ED until their evaluation is complete.     Luvenia Heller, PA-C 11/19/22 1918    Luvenia Heller, PA-C 11/19/22 1919

## 2022-11-19 NOTE — Discharge Instructions (Addendum)
Take some anti-inflammatories.  This could be various kind of pain but there is a small amount of fluid around your heart.

## 2022-11-19 NOTE — ED Provider Notes (Signed)
[place] EMERGENCY DEPARTMENT AT [facility] Provider Note   CSN: [id number] Arrival date & time: 11/19/22  1902     History  Chief Complaint  Patient presents with   Chest Pain    Brenda [name] is a 37 y.o. female.   Chest Pain Patient with chest pain.  Anterior chest.  Has had for the last day or 2.  Worse with certain positions.  Does feel short of breath.  History of asthma.  No swelling in her legs.  States she has had pericarditis before.  States that was a few years ago and was seen through [facility] although I cannot see the notes for it.  No abdominal pain.  No cough.    Past Medical History:  Diagnosis Date   Abnormal Pap smear    Class 3 severe obesity due to excess calories with serious comorbidity and body mass index (BMI) of 40.0 to 44.9 in adult [facility]) 02/16/2020   History of COVID-19 02/16/2020   Hypertension    Iron deficiency anemia due to chronic blood loss 02/16/2020   Mild intermittent asthma, uncomplicated [id number]   Nausea vomiting and diarrhea 12/06/2018   Pneumonia     Home Medications Prior to Admission medications   Medication Sig Start Date End Date Taking? Authorizing Provider  albuterol (PROVENTIL HFA;VENTOLIN HFA) 108 (90 Base) MCG/ACT inhaler Inhale 1-2 puffs into the lungs every 6 (six) hours as needed for wheezing or shortness of breath. 11/28/18   Tacy Learn, PA-C  benzonatate (TESSALON) 100 MG capsule Take 1 capsule (100 mg total) by mouth 2 (two) times daily as needed for cough. 07/24/21   Montine Circle, PA-C  ferrous sulfate 325 (65 FE) MG tablet Take 325 mg by mouth 2 (two) times daily with a meal.     [provider]  fluticasone (FLONASE) 50 MCG/ACT nasal spray Place 2 sprays into both nostrils daily. 07/24/21   Montine Circle, PA-C  furosemide (LASIX) 20 MG tablet Take 1 tablet (20 mg total) by mouth daily as needed for fluid. Patient taking differently: Take 20 mg by mouth daily.  12/07/18   Norval Morton,  MD  ibuprofen (ADVIL) 600 MG tablet Take 1 tablet (600 mg total) by mouth every 6 (six) hours as needed for mild pain, moderate pain or cramping. 01/12/20   Everett Graff, MD  metroNIDAZOLE (FLAGYL) 500 MG tablet Take 1 tablet (500 mg total) by mouth 2 (two) times daily. 03/15/20   LampteyMyrene Galas, MD  Multiple Vitamin (MULTIVITAMIN WITH MINERALS) TABS tablet Take 1 tablet by mouth daily.    [provider]  norethindrone (AYGESTIN) 5 MG tablet Take 5 mg by mouth daily.  09/08/19   [provider]  omeprazole (PRILOSEC) 20 MG capsule Take 1 capsule (20 mg total) by mouth daily. 07/24/21   Montine Circle, PA-C      Allergies    Oxycodone hcl, Latex, and Terconazole    Review of Systems   Review of Systems  Cardiovascular:  Positive for chest pain.    Physical Exam Updated Vital Signs BP (!) 173/103   Pulse 88   Temp 99.4 F (37.4 C)   Resp (!) 30   Ht '5\' 9"'$  (1.753 m)   Wt 133.4 kg   LMP 11/04/2022 (Approximate)   SpO2 95%   Breastfeeding No   BMI 43.42 kg/m  Physical Exam Vitals and nursing note reviewed.  Cardiovascular:     Rate and Rhythm: Normal rate and regular rhythm.  Pulmonary:     Breath sounds: No wheezing or rhonchi.  Chest:     Chest wall: No tenderness.  Musculoskeletal:     Right lower leg: No edema.     Left lower leg: No edema.  Skin:    Capillary Refill: Capillary refill takes less than 2 seconds.  Neurological:     Mental Status: She is alert.     ED Results / Procedures / Treatments   Labs (all labs ordered are listed, but only abnormal results are displayed) Labs Reviewed  BASIC METABOLIC PANEL - Abnormal; Notable for the following components:      Result Value   Sodium 134 (*)    Glucose, Bld 100 (*)    Calcium 8.7 (*)    All other components within normal limits  CBC - Abnormal; Notable for the following components:   WBC 12.1 (*)    RBC 6.01 (*)    Hemoglobin 9.5 (*)    HCT 35.0 (*)    MCV 58.2 (*)    MCH 15.8  (*)    MCHC 27.1 (*)    RDW 23.8 (*)    All other components within normal limits  D-DIMER, QUANTITATIVE (NOT AT [facility]) - Abnormal; Notable for the following components:   D-Dimer, Quant 0.94 (*)    All other components within normal limits  TROPONIN I (HIGH SENSITIVITY)  TROPONIN I (HIGH SENSITIVITY)    EKG EKG Interpretation  Date/Time:  Sunday November 19 2022 19:11:39 EST Ventricular Rate:  85 PR Interval:  148 QRS Duration: 94 QT Interval:  375 QTC Calculation: 446 R Axis:   115 Text Interpretation: Sinus rhythm LAE, consider biatrial enlargement Right axis deviation No significant change since last tracing Confirmed by Davonna Belling [phone]) on 11/19/2022 7:19:08 PM  Radiology CT Angio Chest PE W and/or Wo Contrast  Result Date: 11/19/2022 CLINICAL DATA:  Chest pain, shortness of breath, positive D-dimer EXAM: CT ANGIOGRAPHY CHEST WITH CONTRAST TECHNIQUE: Multidetector CT imaging of the chest was performed using the standard protocol during bolus administration of intravenous contrast. Multiplanar CT image reconstructions and MIPs were obtained to evaluate the vascular anatomy. RADIATION DOSE REDUCTION: This exam was performed according to the departmental dose-optimization program which includes automated exposure control, adjustment of the mA and/or kV according to patient size and/or use of iterative reconstruction technique. CONTRAST:  137m OMNIPAQUE IOHEXOL 350 MG/ML SOLN COMPARISON:  Chest radiograph dated 11/19/2022 FINDINGS: Cardiovascular: Satisfactory opacification the bilateral pulmonary arteries to the lobar level. Evaluation of the segmental pulmonary arteries, particularly in the lower lobes, is constrained by respiratory motion. Within that constraint, there is no evidence of pulmonary embolism. Although not tailored for evaluation of the thoracic aorta, there is no evidence of thoracic aortic aneurysm or dissection. Heart is top-normal in size.  Mild pericardial effusion.  Mediastinum/Nodes: No suspicious mediastinal lymphadenopathy. Visualized thyroid is unremarkable. Lungs/Pleura: Evaluation lung parenchyma is constrained by respiratory motion. Within that constraint, there are no suspicious pulmonary nodules. Mild left lower lobe atelectasis.  No focal consolidation. No pleural effusion or pneumothorax. Upper Abdomen: Visualized upper abdomen is grossly unremarkable. Musculoskeletal: Visualized osseous structures are within normal limits. Review of the MIP images confirms the above findings. IMPRESSION: No evidence of pulmonary embolism. Mild pericardial effusion. Electronically Signed   By: SJulian HyM.D.   On: 11/19/2022 22:17   DG Chest 2 View  Result Date: 11/19/2022 CLINICAL DATA:  Chest pain and shortness of breath EXAM: CHEST - 2 VIEW COMPARISON:  04/12/2022 FINDINGS: Cardiac  shadow is enlarged. The lungs are well aerated bilaterally. No focal infiltrate or sizable effusion is noted. No bony abnormality is seen. IMPRESSION: No active cardiopulmonary disease. Electronically Signed   By: Inez Catalina M.D.   On: 11/19/2022 20:19    Procedures Procedures    Medications Ordered in ED Medications  sodium chloride (PF) 0.9 % injection (has no administration in time range)  iohexol (OMNIPAQUE) 350 MG/ML injection 100 mL (100 mLs Intravenous Contrast Given 11/19/22 2206)    ED Course/ Medical Decision Making/ A&P                             Medical Decision Making Amount and/or Complexity of Data Reviewed Labs: ordered. Radiology: ordered.  Risk Prescription drug management.   Patient with chest pain.  Anterior chest.  EKG reassuring.  Differential diagnosis includes nonspecific chest pain, chest wall pain, pleurisy, pneumothorax, pericarditis, cardiac disease.  Will get blood work and x-ray.  EKG reassuring.  Reviewed previous notes and attempt to find cardiology note.  Workup reassuring.  D-dimer was positive but had CT scan done that was  negative for pulmonary embolism but did have small pericardial effusion.  Further discussion with patient states that cardiology did not necessarily diagnose it as pericarditis but she saw them.  Pericarditis is on the differential diagnosis but is hemodynamically stable.  EKG reassuring and troponin is negative.  Discharge home with cardiology follow-up        Final Clinical Impression(s) / ED Diagnoses Final diagnoses:  Nonspecific chest pain    Rx / DC Orders ED Discharge Orders          Ordered    Ambulatory referral to Cardiology       Comments: If you have not heard from the Cardiology office within the next 72 hours please call [phone].   11/19/22 2244              Davonna Belling, MD 11/19/22 2321

## 2022-11-25 ENCOUNTER — Emergency Department (HOSPITAL_COMMUNITY): Payer: Managed Care, Other (non HMO)

## 2022-11-25 ENCOUNTER — Other Ambulatory Visit: Payer: Self-pay

## 2022-11-25 ENCOUNTER — Emergency Department (HOSPITAL_COMMUNITY)
Admission: EM | Admit: 2022-11-25 | Discharge: 2022-11-25 | Disposition: A | Discharge status: Home / Self Care | Payer: Managed Care, Other (non HMO) | Attending: Emergency Medicine | Admitting: Emergency Medicine

## 2022-11-25 DIAGNOSIS — T465X5A Adverse effect of other antihypertensive drugs, initial encounter: Secondary | ICD-10-CM | POA: Insufficient documentation

## 2022-11-25 DIAGNOSIS — I517 Cardiomegaly: Secondary | ICD-10-CM

## 2022-11-25 DIAGNOSIS — R42 Dizziness and giddiness: Secondary | ICD-10-CM

## 2022-11-25 DIAGNOSIS — T50905A Adverse effect of unspecified drugs, medicaments and biological substances, initial encounter: Secondary | ICD-10-CM

## 2022-11-25 DIAGNOSIS — D649 Anemia, unspecified: Secondary | ICD-10-CM | POA: Diagnosis not present

## 2022-11-25 DIAGNOSIS — I10 Essential (primary) hypertension: Secondary | ICD-10-CM | POA: Insufficient documentation

## 2022-11-25 LAB — CBC
HCT: 35.4 % — ABNORMAL LOW (ref 36.0–46.0)
Hemoglobin: 9.8 g/dL — ABNORMAL LOW (ref 12.0–15.0)
MCH: 15.8 pg — ABNORMAL LOW (ref 26.0–34.0)
MCHC: 27.7 g/dL — ABNORMAL LOW (ref 30.0–36.0)
MCV: 57.1 fL — ABNORMAL LOW (ref 80.0–100.0)
Platelets: 330 10*3/uL (ref 150–400)
RBC: 6.2 MIL/uL — ABNORMAL HIGH (ref 3.87–5.11)
RDW: 23.3 % — ABNORMAL HIGH (ref 11.5–15.5)
WBC: 7.2 10*3/uL (ref 4.0–10.5)
nRBC: 0 % (ref 0.0–0.2)

## 2022-11-25 LAB — BASIC METABOLIC PANEL
Anion gap: 12 (ref 5–15)
BUN: 12 mg/dL (ref 6–20)
CO2: 26 mmol/L (ref 22–32)
Calcium: 9.4 mg/dL (ref 8.9–10.3)
Chloride: 98 mmol/L (ref 98–111)
Creatinine, Ser: 0.81 mg/dL (ref 0.44–1.00)
GFR, Estimated: >60 mL/min (ref >60)
Glucose, Bld: 114 mg/dL — ABNORMAL HIGH (ref 70–99)
Potassium: 4.1 mmol/L (ref 3.5–5.1)
Sodium: 136 mmol/L (ref 135–145)

## 2022-11-25 LAB — TROPONIN I (HIGH SENSITIVITY): Troponin I (High Sensitivity): 7 ng/L (ref <18)

## 2022-11-25 LAB — CBG MONITORING, ED: Glucose-Capillary: 125 mg/dL — ABNORMAL HIGH (ref 70–99)

## 2022-11-25 LAB — I-STAT BETA HCG BLOOD, ED (MC, WL, AP ONLY): I-stat hCG, quantitative: <5 m[IU]/mL (ref <5)

## 2022-11-25 NOTE — ED Triage Notes (Addendum)
Pt to ED c/o dizziness since 530 this morning, reports when at work at 530 this morning "felt faint" Pt did not pass out. Reports feeling dizziness intermittently since. Also reports intermittent chest pain. No s.s of acute distress noted in triage.

## 2022-11-25 NOTE — ED Provider Notes (Signed)
[place] Provider Note   CSN: [id number] Arrival date & time: 11/25/22  1318     History  Chief Complaint  Patient presents with   Dizziness   Chest Pain    Brenda [name] is a 37 y.o. female.   Dizziness Associated symptoms: chest pain   Chest Pain Associated symptoms: dizziness      Patient with medical history of hypertension, hyperlipidemia, anemia presents to the emergency department due to lightheadedness.  Patient states her symptoms started this morning, she distressful morning with a flat tire and felt very pressured throughout the day.  While she was at work she started having intermittent lightheadedness is worse with any exertion and standing upright.  She does states she started taking her blood pressure medicine consistently today as she was only taking it intermittently before.  Lightheadedness is associated with chest pain, she states the chest pain has been going on for over a week and a half, was seen in the ED for this 5 days ago and had a negative PE study.  The chest pain does not feel any change compared to that.  She is not having any shortness of breath, denies any other changes in medication.  She does have a history of anemia although she denies any vaginal bleeding or rectal bleeding.  Home Medications Prior to Admission medications   Medication Sig Start Date End Date Taking? Authorizing Provider  albuterol (PROVENTIL HFA;VENTOLIN HFA) 108 (90 Base) MCG/ACT inhaler Inhale 1-2 puffs into the lungs every 6 (six) hours as needed for wheezing or shortness of breath. 11/28/18   Tacy Learn, PA-C  benzonatate (TESSALON) 100 MG capsule Take 1 capsule (100 mg total) by mouth 2 (two) times daily as needed for cough. 07/24/21   Montine Circle, PA-C  ferrous sulfate 325 (65 FE) MG tablet Take 325 mg by mouth 2 (two) times daily with a meal.     [provider]  fluticasone (FLONASE) 50 MCG/ACT nasal spray  Place 2 sprays into both nostrils daily. 07/24/21   Montine Circle, PA-C  furosemide (LASIX) 20 MG tablet Take 1 tablet (20 mg total) by mouth daily as needed for fluid. Patient taking differently: Take 20 mg by mouth daily.  12/07/18   Norval Morton, MD  ibuprofen (ADVIL) 600 MG tablet Take 1 tablet (600 mg total) by mouth every 6 (six) hours as needed for mild pain, moderate pain or cramping. 01/12/20   Everett Graff, MD  metroNIDAZOLE (FLAGYL) 500 MG tablet Take 1 tablet (500 mg total) by mouth 2 (two) times daily. 03/15/20   LampteyMyrene Galas, MD  Multiple Vitamin (MULTIVITAMIN WITH MINERALS) TABS tablet Take 1 tablet by mouth daily.    [provider]  norethindrone (AYGESTIN) 5 MG tablet Take 5 mg by mouth daily.  09/08/19   [provider]  omeprazole (PRILOSEC) 20 MG capsule Take 1 capsule (20 mg total) by mouth daily. 07/24/21   Montine Circle, PA-C      Allergies    Oxycodone hcl, Latex, and Terconazole    Review of Systems   Review of Systems  Cardiovascular:  Positive for chest pain.  Neurological:  Positive for dizziness.    Physical Exam Updated Vital Signs BP (!) 146/85   Pulse 72   Temp 97.7 F (36.5 C) (Oral)   Resp (!) 22   Ht '5\' 9"'$  (1.753 m)   Wt 133.4 kg   LMP 11/04/2022 (Approximate)   SpO2  100%   BMI 43.42 kg/m  Physical Exam Vitals and nursing note reviewed. Exam conducted with a chaperone present.  Constitutional:      Appearance: Normal appearance.  HENT:     Head: Normocephalic and atraumatic.  Eyes:     General: No scleral icterus.       Right eye: No discharge.        Left eye: No discharge.     Extraocular Movements: Extraocular movements intact.     Pupils: Pupils are equal, round, and reactive to light.  Cardiovascular:     Rate and Rhythm: Normal rate and regular rhythm.     Pulses: Normal pulses.     Heart sounds: Normal heart sounds. No murmur heard.    No friction rub. No gallop.  Pulmonary:     Effort:  Pulmonary effort is normal. No respiratory distress.     Breath sounds: Normal breath sounds.  Abdominal:     General: Abdomen is flat. Bowel sounds are normal. There is no distension.     Palpations: Abdomen is soft.     Tenderness: There is no abdominal tenderness.  Musculoskeletal:     Right lower leg: No edema.     Left lower leg: No edema.  Skin:    General: Skin is warm and dry.     Coloration: Skin is not jaundiced.  Neurological:     Mental Status: She is alert. Mental status is at baseline.     Coordination: Coordination normal.     Comments: Cranial nerves II through XII are grossly intact.  Upper and lower extremity strength is symmetric bilaterally.     ED Results / Procedures / Treatments   Labs (all labs ordered are listed, but only abnormal results are displayed) Labs Reviewed  BASIC METABOLIC PANEL - Abnormal; Notable for the following components:      Result Value   Glucose, Bld 114 (*)    All other components within normal limits  CBC - Abnormal; Notable for the following components:   RBC 6.20 (*)    Hemoglobin 9.8 (*)    HCT 35.4 (*)    MCV 57.1 (*)    MCH 15.8 (*)    MCHC 27.7 (*)    RDW 23.3 (*)    All other components within normal limits  CBG MONITORING, ED - Abnormal; Notable for the following components:   Glucose-Capillary 125 (*)    All other components within normal limits  TSH  T4, FREE  I-STAT BETA HCG BLOOD, ED ([facility], [facility], [facility] ONLY)  TROPONIN I (HIGH SENSITIVITY)  TROPONIN I (HIGH SENSITIVITY)    EKG None  Radiology DG Chest Portable 1 View  Result Date: 11/25/2022 CLINICAL DATA:  Chest pain. EXAM: PORTABLE CHEST 1 VIEW COMPARISON:  11/19/2022 CT, radiograph and prior studies FINDINGS: Cardiomegaly again noted. There is no evidence of focal airspace disease, pulmonary edema, suspicious pulmonary nodule/mass, pleural effusion, or pneumothorax. No acute bony abnormalities are identified. IMPRESSION: Cardiomegaly without evidence of acute  cardiopulmonary disease. Electronically Signed   By: Margarette Canada M.D.   On: 11/25/2022 14:41    Procedures Procedures    Medications Ordered in ED Medications - No data to display  ED Course/ Medical Decision Making/ A&P Clinical Course as of 11/25/22 1712  Sat Nov 25, 2022  1503 ED EKG Sinus rhythm, grossly unchanged compared to previous EKGs.  Patient is in sinus rhythm on the cardiac monitor as well. [HS]  1505 DG Chest Portable 1 View Cardiomegaly  but no acute process [HS]  1505 Last echocardiogram in 2020, LVEF 60 to 65%. [HS]  [id number] I viewed external chest medical records including previous chest x-rays, patient has had a history of cardiomegaly without any diagnosis of CHF. [HS]  [id number] CBC(!) Anemic with a hemoglobin of 9.8, roughly baseline per chart review. [HS]  123456 Basic metabolic panel(!) No gross electrolyte derangement or AKI [HS]  1543 Troponin I (High Sensitivity) Low [HS]  1543 I-Stat beta hCG blood, ED Not pregnant, no ruptured ectopic [HS]    Clinical Course User Index [HS] Sherrill Raring, PA-C                             Medical Decision Making Amount and/or Complexity of Data Reviewed Labs: ordered. Decision-making details documented in ED Course. Radiology: ordered. Decision-making details documented in ED Course. ECG/medicine tests:  Decision-making details documented in ED Course.   This is a 37 year old female presenting to the emergency department due to lightheadedness peer differential includes not limited to arrhythmia, ACS, PE, pneumonia, electrolyte derangement, AKI, dehydration, statin, medication reaction, symptomatic anemia.  No appreciable focal deficits on neuroexam.  I reviewed external medical records including PE study, prior echocardiogram and previous radiographic imaging as documented in ED course.  I ordered, viewed and interpreted laboratory workup as documented in ED course.  Troponin within normal limits, no gross electrolyte  derangement or AKI.  Patient is notably anemic and there may be a component of symptomatic anemia contributing to her presentation today.    She is not orthostatic, she did report she discharged taking her blood pressure medicine regularly so this also may be slightly had medication reaction.    Do not think this is ACS, no EKG changes, negative troponin and heart score 2.  She also been worked up extensively for this within the last week and I do not think need to repeat a PE study as I do not think this is a PE, not tachycardic, not hypoxic and not having any pleuritic chest pain.  Patient has been on cardiac monitoring in sinus rhythm, no underlying arrhythmia detected per my interpretation.  I reeval the patient multiple times.  Well-appearing, no return of symptoms.  I discussed the workup with the patient thus far.  Encouraged him to take iron supplements with vitamin C, continue taking her blood pressure medicine regularly and to keep her appointment with her primary on Tuesday this week for close reevaluation.  She is already been referred for the cardiologist has an appointment coming up as well.  Have her continue following up with them closely.  Return precautions were discussed with the patient who verbalized understanding.         Final Clinical Impression(s) / ED Diagnoses Final diagnoses:  Symptomatic anemia  Lightheaded  Adverse effect of drug, initial encounter  Cardiomegaly    Rx / DC Orders ED Discharge Orders     None         Sherrill Raring, PA-C 11/25/22 Weakley, Ankit, MD 11/26/22 [phone]

## 2022-11-25 NOTE — Discharge Instructions (Addendum)
You are seen today in the emergency department due to lightheadedness.  Your cardiac monitoring has been reassuring, I do not think this is your heart.  You are slightly anemic, take iron supplements and vitamin C supplements.  Follow-up with your cardiologist as planned.  Continue taking the blood pressure medicine, may take some time to fully regulate.  Return to the ED for new or concerning symptoms.

## 2022-12-07 ENCOUNTER — Ambulatory Visit: Payer: Managed Care, Other (non HMO) | Attending: Internal Medicine | Admitting: Internal Medicine

## 2022-12-07 ENCOUNTER — Encounter: Payer: Self-pay | Admitting: Internal Medicine

## 2022-12-07 VITALS — BP 148/82 | HR 88 | Ht 69.0 in | Wt 298.4 lb

## 2022-12-07 DIAGNOSIS — R079 Chest pain, unspecified: Secondary | ICD-10-CM | POA: Diagnosis not present
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
## 2022-12-07 NOTE — Patient Instructions (Signed)
Medication Instructions:  Your physician recommends that you continue on your current medications as directed. Please refer to the Current Medication list given to you today.  *If you need a refill on your cardiac medications before your next appointment, please call your pharmacy*   Lab Work: NONE If you have labs (blood work) drawn today and your tests are completely normal, you will receive your results only by: MyChart Message (if you have MyChart) OR A paper copy in the mail If you have any lab test that is abnormal or we need to change your treatment, we will call you to review the results.   Testing/Procedures: NONE   Follow-Up: At [facility], you and your health needs are our priority.  As part of our continuing mission to provide you with exceptional heart care, we have created designated Provider Care Teams.  These Care Teams include your primary Cardiologist (physician) and Advanced Practice Providers (APPs -  Physician Assistants and Nurse Practitioners) who all work together to provide you with the care you need, when you need it.  We recommend signing up for the patient portal called "MyChart".  Sign up information is provided on this After Visit Summary.  MyChart is used to connect with patients for Virtual Visits (Telemedicine).  Patients are able to view lab/test results, encounter notes, upcoming appointments, etc.  Non-urgent messages can be sent to your provider as well.   To learn more about what you can do with MyChart, go to [email or web address].    Your next appointment:   As Needed   Provider:   Branch, Mary E, MD    

## 2022-12-10 ENCOUNTER — Encounter (HOSPITAL_COMMUNITY): Payer: Self-pay | Admitting: Emergency Medicine

## 2022-12-10 ENCOUNTER — Emergency Department (HOSPITAL_COMMUNITY)
Admission: EM | Admit: 2022-12-10 | Discharge: 2022-12-10 | Disposition: A | Discharge status: Home / Self Care | Payer: Managed Care, Other (non HMO) | Attending: Emergency Medicine | Admitting: Emergency Medicine

## 2022-12-10 ENCOUNTER — Emergency Department (HOSPITAL_COMMUNITY): Payer: Managed Care, Other (non HMO)

## 2022-12-10 ENCOUNTER — Other Ambulatory Visit: Payer: Self-pay

## 2022-12-10 DIAGNOSIS — Z91040 Latex allergy status: Secondary | ICD-10-CM | POA: Diagnosis not present

## 2022-12-10 DIAGNOSIS — Y92410 Unspecified street and highway as the place of occurrence of the external cause: Secondary | ICD-10-CM | POA: Diagnosis not present

## 2022-12-10 DIAGNOSIS — M25521 Pain in right elbow: Secondary | ICD-10-CM | POA: Diagnosis present

## 2022-12-10 DIAGNOSIS — M25511 Pain in right shoulder: Secondary | ICD-10-CM | POA: Insufficient documentation

## 2022-12-10 MED ORDER — IBUPROFEN 400 MG PO TABS
600.0000 mg | ORAL_TABLET | Freq: Once | ORAL | Status: AC
  Administered 2022-12-10: 600 mg via ORAL
  Filled 2022-12-10: qty 1

## 2022-12-10 NOTE — Progress Notes (Signed)
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 [name] 12/10/2022, 7:37 AM
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
## 2022-12-10 NOTE — ED Notes (Signed)
RN informed pt ortho tech will be here shortly to apply splint.
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
## 2022-12-10 NOTE — Discharge Instructions (Addendum)
I do not see any fractures on your x-rays however you are very tender when I push on your right elbow and there is some evidence of hemarthrosis which is a medical tamponade given today may be some blood in your joint space which could indicate a fracture that has not been going on x-ray.  Please keep the splint in place until you are seen by the orthopedist office but I have given you the information for.  Please use Tylenol or ibuprofen for pain.  You may use 600 mg ibuprofen every 6 hours or 1000 mg of Tylenol every 6 hours.  You may choose to alternate between the 2.  This would be most effective.  Not to exceed 4 g of Tylenol within 24 hours.  Not to exceed 3200 mg ibuprofen 24 hours.

## 2023-03-14 IMAGING — CR DG CHEST 2V
2 series · 2 of 2 positions shown · non-contrast
Comparison: 02/28/2019

CLINICAL DATA: Cough and chest pain for 1 week, initial encounter

EXAM:
CHEST - 2 VIEW

[chest lat]
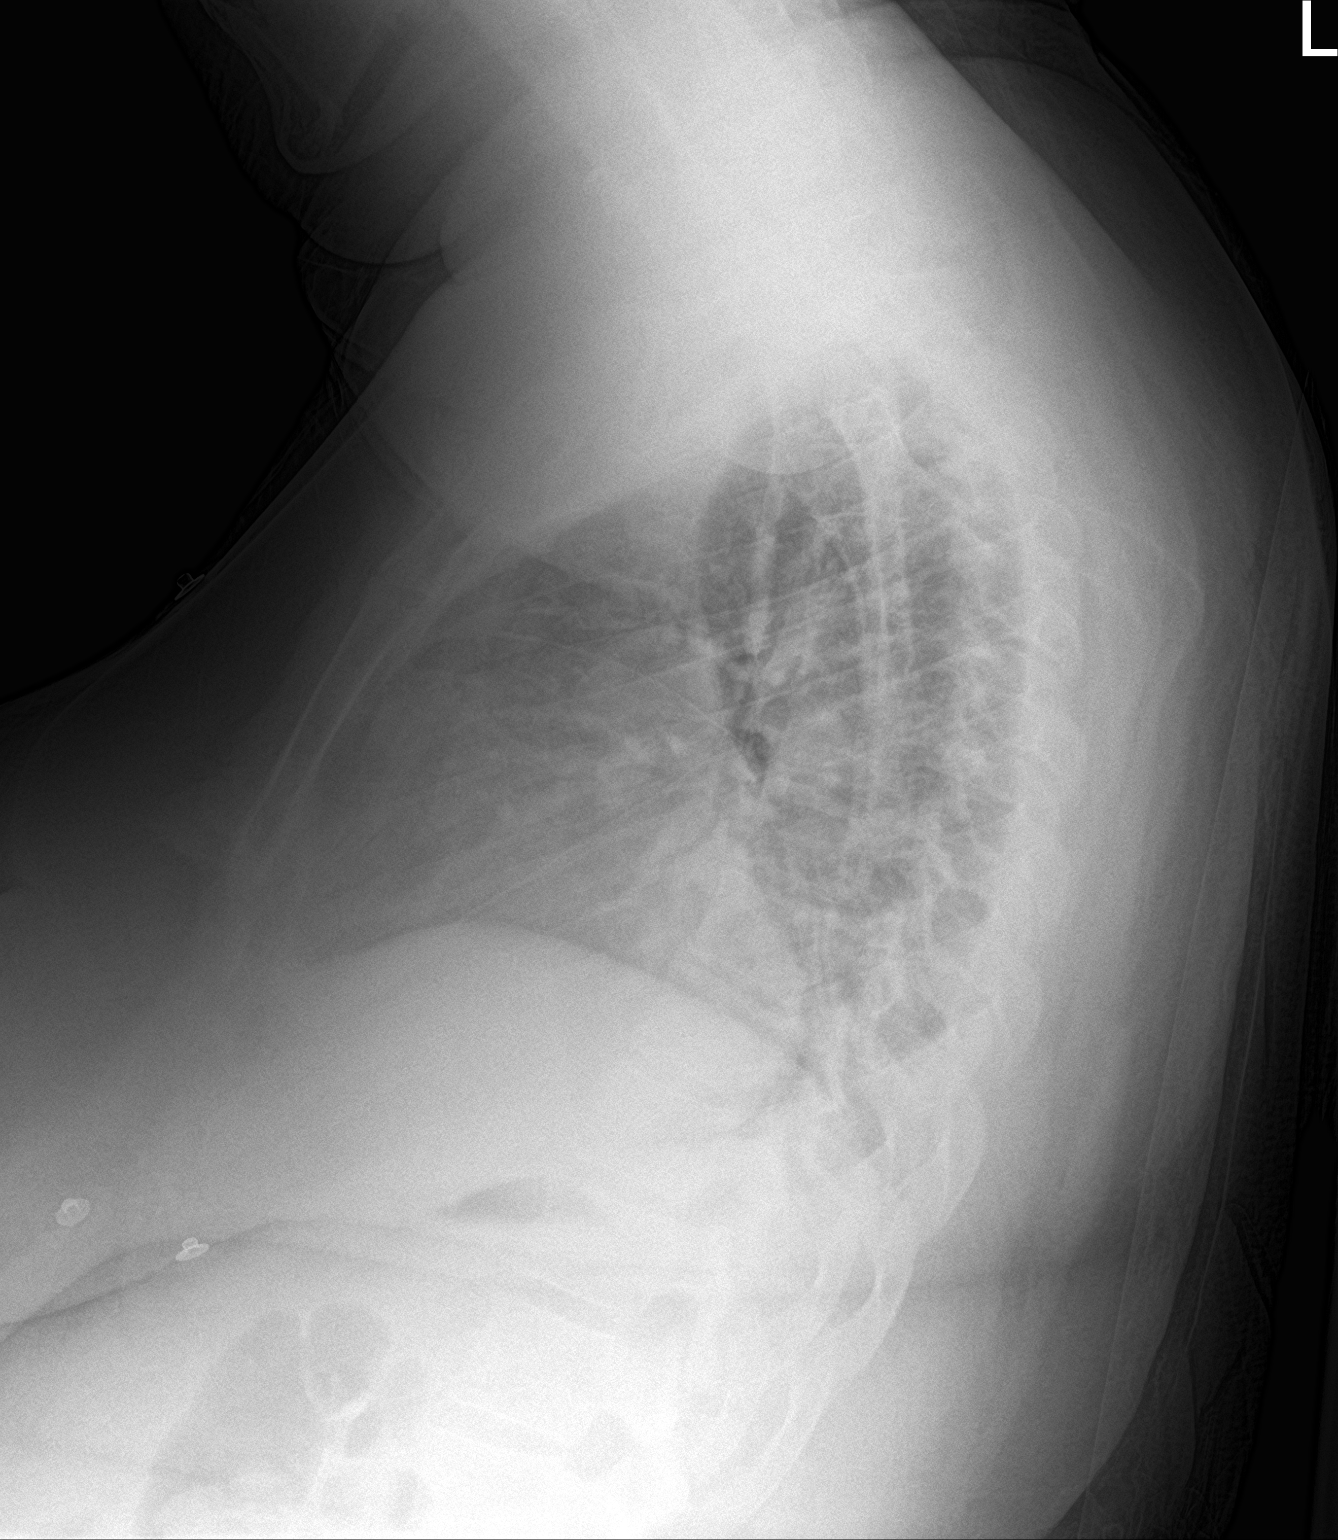

[chest ap]
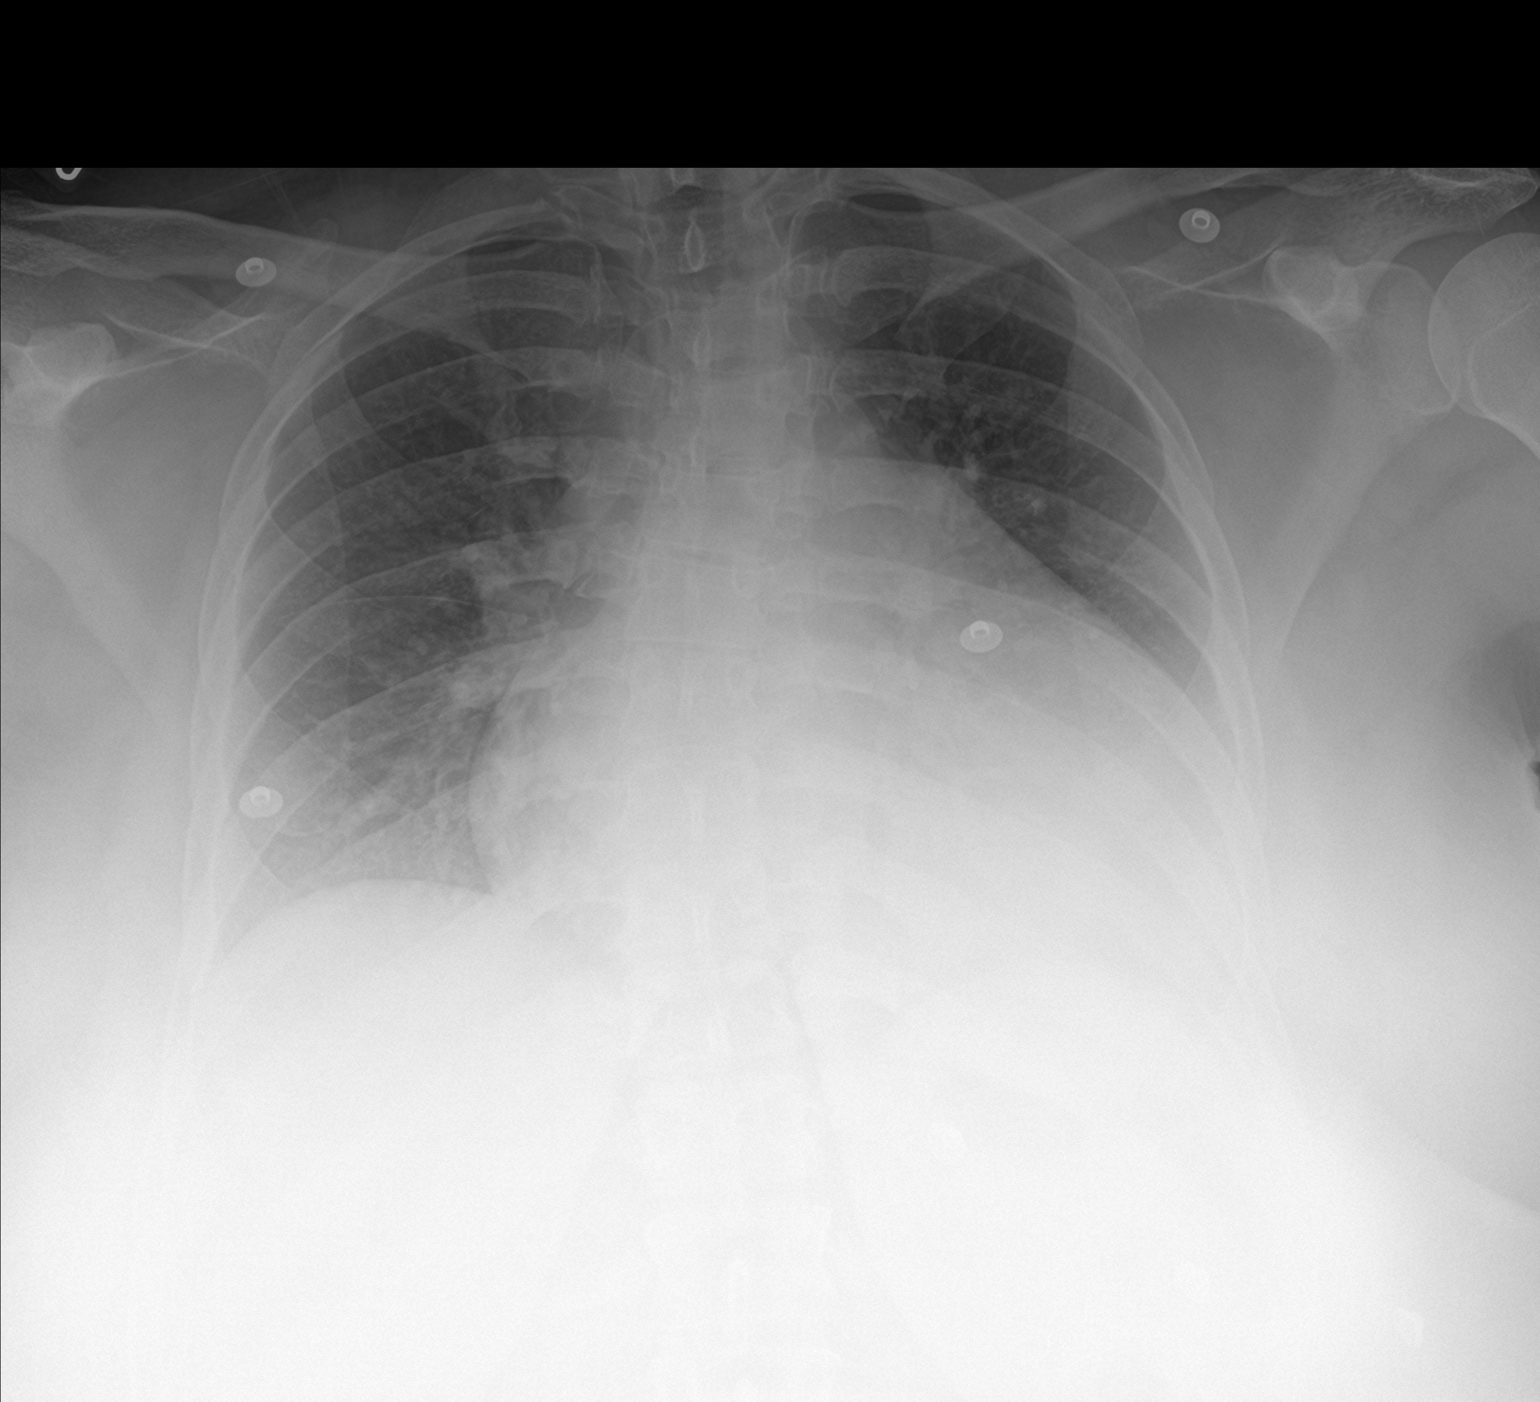

[2 of 2 positions shown; findings below may reference images not displayed]

FINDINGS: Cardiac shadow remains enlarged. Lungs are well aerated bilaterally.
No focal infiltrate or effusion is seen. No bony abnormality is
noted.
IMPRESSION: Stable cardiomegaly.  No acute abnormality noted.

## 2023-10-05 ENCOUNTER — Emergency Department (HOSPITAL_COMMUNITY)
Admission: EM | Admit: 2023-10-05 | Discharge: 2023-10-05 | Disposition: A | Discharge status: Home / Self Care | Payer: Managed Care, Other (non HMO) | Attending: Emergency Medicine | Admitting: Emergency Medicine

## 2023-10-05 ENCOUNTER — Encounter (HOSPITAL_COMMUNITY): Payer: Self-pay | Admitting: Emergency Medicine

## 2023-10-05 ENCOUNTER — Other Ambulatory Visit: Payer: Self-pay

## 2023-10-05 ENCOUNTER — Emergency Department (HOSPITAL_COMMUNITY): Payer: Managed Care, Other (non HMO)

## 2023-10-05 DIAGNOSIS — Z20822 Contact with and (suspected) exposure to covid-19: Secondary | ICD-10-CM | POA: Insufficient documentation

## 2023-10-05 DIAGNOSIS — J45909 Unspecified asthma, uncomplicated: Secondary | ICD-10-CM | POA: Diagnosis not present

## 2023-10-05 DIAGNOSIS — Z79899 Other long term (current) drug therapy: Secondary | ICD-10-CM | POA: Diagnosis not present

## 2023-10-05 DIAGNOSIS — I10 Essential (primary) hypertension: Secondary | ICD-10-CM | POA: Insufficient documentation

## 2023-10-05 DIAGNOSIS — J040 Acute laryngitis: Secondary | ICD-10-CM | POA: Diagnosis not present

## 2023-10-05 DIAGNOSIS — Z91040 Latex allergy status: Secondary | ICD-10-CM | POA: Diagnosis not present

## 2023-10-05 DIAGNOSIS — R0602 Shortness of breath: Secondary | ICD-10-CM | POA: Diagnosis present

## 2023-10-05 LAB — BASIC METABOLIC PANEL
Anion gap: 8 (ref 5–15)
BUN: 13 mg/dL (ref 6–20)
CO2: 23 mmol/L (ref 22–32)
Calcium: 8.9 mg/dL (ref 8.9–10.3)
Chloride: 105 mmol/L (ref 98–111)
Creatinine, Ser: 0.63 mg/dL (ref 0.44–1.00)
GFR, Estimated: >60 mL/min (ref >60)
Glucose, Bld: 125 mg/dL — ABNORMAL HIGH (ref 70–99)
Potassium: 3.9 mmol/L (ref 3.5–5.1)
Sodium: 136 mmol/L (ref 135–145)

## 2023-10-05 LAB — CBC WITH DIFFERENTIAL/PLATELET
Abs Immature Granulocytes: 0.02 10*3/uL (ref 0.00–0.07)
Basophils Absolute: 0.1 10*3/uL (ref 0.0–0.1)
Basophils Relative: 1 %
Eosinophils Absolute: 0.1 10*3/uL (ref 0.0–0.5)
Eosinophils Relative: 1 %
HCT: 34.4 % — ABNORMAL LOW (ref 36.0–46.0)
Hemoglobin: 9.2 g/dL — ABNORMAL LOW (ref 12.0–15.0)
Immature Granulocytes: 0 %
Lymphocytes Relative: 19 %
Lymphs Abs: 1.5 10*3/uL (ref 0.7–4.0)
MCH: 15.5 pg — ABNORMAL LOW (ref 26.0–34.0)
MCHC: 26.7 g/dL — ABNORMAL LOW (ref 30.0–36.0)
MCV: 57.8 fL — ABNORMAL LOW (ref 80.0–100.0)
Monocytes Absolute: 1 10*3/uL (ref 0.1–1.0)
Monocytes Relative: 13 %
Neutro Abs: 5.2 10*3/uL (ref 1.7–7.7)
Neutrophils Relative %: 66 %
Platelet Morphology: NORMAL
Platelets: 287 10*3/uL (ref 150–400)
RBC: 5.95 MIL/uL — ABNORMAL HIGH (ref 3.87–5.11)
RDW: 23.1 % — ABNORMAL HIGH (ref 11.5–15.5)
WBC: 7.9 10*3/uL (ref 4.0–10.5)
nRBC: 0 % (ref 0.0–0.2)

## 2023-10-05 LAB — RESP PANEL BY RT-PCR (RSV, FLU A&B, COVID)  RVPGX2
Influenza A by PCR: NEGATIVE
Influenza B by PCR: NEGATIVE
Resp Syncytial Virus by PCR: NEGATIVE
SARS Coronavirus 2 by RT PCR: NEGATIVE

## 2023-10-05 LAB — TROPONIN I (HIGH SENSITIVITY): Troponin I (High Sensitivity): 4 ng/L (ref <18)

## 2023-10-05 LAB — HCG, SERUM, QUALITATIVE: Preg, Serum: NEGATIVE

## 2023-10-05 MED ORDER — EPINEPHRINE 0.3 MG/0.3ML IJ SOAJ
0.3000 mg | Freq: Once | INTRAMUSCULAR | Status: DC
  Filled 2023-10-05: qty 0.3

## 2023-10-05 MED ORDER — PREDNISONE 10 MG PO TABS: 20.0000 mg | ORAL_TABLET | Freq: Every day | ORAL | 0 refills | Status: DC

## 2023-10-05 MED ORDER — METHYLPREDNISOLONE SODIUM SUCC 125 MG IJ SOLR
125.0000 mg | Freq: Once | INTRAMUSCULAR | Status: AC
  Administered 2023-10-05: 125 mg via INTRAVENOUS
  Filled 2023-10-05: qty 2

## 2023-10-05 MED ORDER — PREDNISONE 10 MG PO TABS: 20.0000 mg | ORAL_TABLET | Freq: Every day | ORAL | 0 refills | Status: AC

## 2023-10-05 MED ORDER — IPRATROPIUM-ALBUTEROL 0.5-2.5 (3) MG/3ML IN SOLN
3.0000 mL | Freq: Once | RESPIRATORY_TRACT | Status: AC
  Administered 2023-10-05: 3 mL via RESPIRATORY_TRACT
  Filled 2023-10-05: qty 3

## 2023-10-05 MED ORDER — IOHEXOL 350 MG/ML SOLN
80.0000 mL | Freq: Once | INTRAVENOUS | Status: AC | PRN
  Administered 2023-10-05: 100 mL via INTRAVENOUS

## 2023-10-05 MED ORDER — SODIUM CHLORIDE (PF) 0.9 % IJ SOLN
INTRAMUSCULAR | Status: AC
  Filled 2023-10-05: qty 50

## 2023-10-05 MED ORDER — FAMOTIDINE IN NACL 20-0.9 MG/50ML-% IV SOLN
20.0000 mg | Freq: Once | INTRAVENOUS | Status: AC
  Administered 2023-10-05: 20 mg via INTRAVENOUS
  Filled 2023-10-05: qty 50

## 2023-10-05 NOTE — ED Notes (Addendum)
Pt refusing Epi at this time, [name], MD aware

## 2023-10-05 NOTE — ED Provider Notes (Signed)
[place] EMERGENCY DEPARTMENT AT [facility] Provider Note   CSN: [id number] Arrival date & time: 10/05/23  2376     History  Chief Complaint  Patient presents with   Shortness of Breath    Brenda [name] is a 38 y.o. female.  Patient with history of obesity, hypertension, asthma presents today with complaints of shortness of breath.  She states that she went to bed last night in good health feeling completely normal, woke up in the middle of the night short of breath.  Felt like her throat was closing.  Called EMS for evaluation of same.  Notes that her voice is now hoarse.  She does state that her symptoms have mildly improved since she first woke up, however she still feels pain and swelling in her throat.  Denies history of similar.  No known new exposures or medications.  She takes losartan for blood pressure.  According to EMS, she was wheezing and given DuoNebs with improvement.  She was also satting in the 80s with ambulation.  Denies leg pain or leg swelling.  No chest pain.  Does also note that she has a new cough since waking up this morning.  She is tolerating secretions.  No fevers.  She does not feel like her lips or tongue are swollen.  She notes that she ate hibachi for dinner last night, states that she has eaten at this restaurant routinely for some time.  No rashes or itching.  The history is provided by the patient. No language interpreter was used.  Shortness of Breath      Home Medications Prior to Admission medications   Medication Sig Start Date End Date Taking? Authorizing Provider  albuterol (PROVENTIL HFA;VENTOLIN HFA) 108 (90 Base) MCG/ACT inhaler Inhale 1-2 puffs into the lungs every 6 (six) hours as needed for wheezing or shortness of breath. 11/28/18   Jeannie Fend, PA-C  furosemide (LASIX) 20 MG tablet Take 1 tablet (20 mg total) by mouth daily as needed for fluid. Patient taking differently: Take 20 mg by mouth daily. 12/07/18   Clydie Braun, MD  losartan-hydrochlorothiazide (HYZAAR) 100-25 MG tablet Take 1 tablet by mouth daily.    [provider]  Multiple Vitamin (MULTIVITAMIN WITH MINERALS) TABS tablet Take 1 tablet by mouth daily.    [provider]      Allergies    Oxycodone hcl, Latex, and Terconazole    Review of Systems   Review of Systems  Respiratory:  Positive for shortness of breath.   All other systems reviewed and are negative.   Physical Exam Updated Vital Signs BP (!) 150/77 (BP Location: Left Arm)   Pulse 88   Temp 98.1 F (36.7 C) (Oral)   Resp 19   LMP  (LMP Unknown)   SpO2 98%  Physical Exam Vitals and nursing note reviewed.  Constitutional:      General: She is not in acute distress.    Appearance: Normal appearance. She is normal weight. She is not ill-appearing, toxic-appearing or diaphoretic.  HENT:     Head: Normocephalic and atraumatic.     Mouth/Throat:     Comments: No tongue or lip swelling.  No swelling under the tongue.  No PTA or RPA.  Uvula midline.  Hoarse voice noted Cardiovascular:     Rate and Rhythm: Normal rate and regular rhythm.     Heart sounds: Normal heart sounds.  Pulmonary:     Effort: Pulmonary effort is normal. No  respiratory distress.     Breath sounds: No stridor. No wheezing.  Abdominal:     Palpations: Abdomen is soft.     Tenderness: There is no abdominal tenderness.  Musculoskeletal:        General: Normal range of motion.     Cervical back: Normal range of motion.     Right lower leg: No tenderness. No edema.     Left lower leg: No tenderness. No edema.  Skin:    General: Skin is warm and dry.  Neurological:     General: No focal deficit present.     Mental Status: She is alert.  Psychiatric:        Mood and Affect: Mood normal.        Behavior: Behavior normal.     ED Results / Procedures / Treatments   Labs (all labs ordered are listed, but only abnormal results are displayed) Labs Reviewed  RESP PANEL BY  RT-PCR (RSV, FLU A&B, COVID)  RVPGX2  CBC WITH DIFFERENTIAL/PLATELET  BASIC METABOLIC PANEL    EKG None  Radiology No results found.  Procedures Procedures    Medications Ordered in ED Medications  ipratropium-albuterol (DUONEB) 0.5-2.5 (3) MG/3ML nebulizer solution 3 mL (has no administration in time range)  methylPREDNISolone sodium succinate (SOLU-MEDROL) 125 mg/2 mL injection 125 mg (has no administration in time range)  EPINEPHrine (EPI-PEN) injection 0.3 mg (has no administration in time range)    ED Course/ Medical Decision Making/ A&P                                 Medical Decision Making Amount and/or Complexity of Data Reviewed Labs: ordered. Radiology: ordered.  Risk Prescription drug management.   This patient is a 38 y.o. female who presents to the ED for concern of shortness of breath, throat swelling sensation, this involves an extensive number of treatment options, and is a complaint that carries with it a high risk of complications and morbidity. The emergent differential diagnosis prior to evaluation includes, but is not limited to, anaphylaxis, abscess, PTA, RPA, stridor. This is not an exhaustive differential.   Past Medical History / Co-morbidities / Social History:  has a past medical history of Abnormal Pap smear, Class 3 severe obesity due to excess calories with serious comorbidity and body mass index (BMI) of 40.0 to 44.9 in adult [facility]) (02/16/2020), History of COVID-19 (02/16/2020), Hypertension, Iron deficiency anemia due to chronic blood loss (02/16/2020), Mild intermittent asthma, uncomplicated (02/16/2020), Nausea vomiting and diarrhea (12/06/2018), and Pneumonia.  Additional history: Chart reviewed.  Physical Exam: Physical exam performed. The pertinent findings include: Hoarse voice noted, no stridor or wheezing.  Tolerating secretions.  Speaking in complete sentences.  No signs of angioedema.  Lab Tests: Labs pending at shift change    Imaging Studies: I ordered imaging studies including CXR.  Imaging pending at shift change.   Cardiac Monitoring:  The patient was maintained on a cardiac monitor.  My attending physician Dr. Manus Gunning viewed and interpreted the cardiac monitored which showed an underlying rhythm of: no STEMI. I agree with this interpretation.   Medications: I ordered medication including epi, Solu-Medrol, DuoNeb for concern for anaphylaxis. Reevaluation of the patient pending at shift change.   Disposition:  Patient's labs and reassessment after medication is pending at shift change and will determine dispo.  If improvement, she may be able to go home with steroids and an EpiPen.  If  no improvement with these interventions, would consider CT soft tissue neck and/or CTA.   I discussed this case with my attending physician Dr. Manus Gunning who cosigned this note including patient's presenting symptoms, physical exam, and planned diagnostics and interventions. Attending physician stated agreement with plan or made changes to plan which were implemented.    Final Clinical Impression(s) / ED Diagnoses Final diagnoses:  None    Rx / DC Orders ED Discharge Orders     None         Vear Clock 10/05/23 5621    Glynn Octave, MD 10/05/23 2103

## 2023-10-05 NOTE — ED Provider Notes (Cosign Needed Addendum)
Patient given in sign out by Smoot, PA-C.  Please review their note for patient HPI, physical exam, workup.  At this time the plan is reassessed after medications and monitor at minimum for 4 hours that she is getting at the see if she improves.  If she does not improve then consider doing larger workup but labs and imaging.  Of note patient has not had any hypoxic readings here and as per the previous provider is not in respiratory distress.  I went to evaluate the patient patient was in no acute distress but did have obvious hoarseness in her voice.  Patient's lungs are clear to auscultation bilaterally and ENT evaluation did not show anything significant outside of a few cavities without signs of infection.  Patient denies dental history.  Patient refused the IV earlier as she states this is not an allergic reaction is she has not had any new foods/lotions/soaps/close her linens.  Patient I had shared decision making given patient's follow-up with PCP hoarseness of voice and reported hypoxia per EMS we agreed to get CT with contrast of the neck along with CTA of the chest to further evaluate.  Patient stable at this time.  Patient's labs are reassuring.  Patient's initial troponin was negative and since patient is not endorsing any shortness of breath or chest pain here and has not had any wheezing per the previous providers and I and after discussion with the patient patient does not feel she needs a second 1 and so we will discontinue as hoarseness of voice is not consistent with ACS.  Awaiting imaging and BNP.  Patient stable this time.  If negative or reassuring will discharge with prednisone taper as this could be laryngitis.  Patient states she does not have history of CHF and does not want a wait for BNP and would like it discontinued.  Patient does not appear fluid overloaded with negative CT scan and thus do not feel her symptoms are related to CHF and so this is reasonable.  Encourage patient to  continue taking her medications and we will add on prednisone for the next few days.  Patient was given return precautions.  Patient verbalized understanding acceptance of plan.  Patient stable to be discharged.   Netta Corrigan, PA-C 10/05/23 [id number]    Glynn Octave, MD 10/07/23 [phone]

## 2023-10-05 NOTE — Discharge Instructions (Addendum)
Please follow-up with your primary care provider in regards to his symptoms and ER visit.  Today your labs and imaging are reassuring you most likely have laryngitis causing your hoarse voice.  This is usually from a virus and should resolve in 7 to 10 days however prescribe use a few days worth of steroids to help with inflammation.  Please do not take ibuprofen while on the steroids as this could hurt your stomach and use Tylenol instead.  If symptoms change or worsen please return to the ER.

## 2023-10-05 NOTE — ED Triage Notes (Signed)
BIBA Per EMS: pt coming from home w/ c/o SHOB. Ambulatory sats in the 80s. Duoneb given en route. Wheezing noted. Sats 100% in triage

## 2024-02-25 ENCOUNTER — Other Ambulatory Visit: Payer: Self-pay

## 2024-02-25 ENCOUNTER — Emergency Department (HOSPITAL_BASED_OUTPATIENT_CLINIC_OR_DEPARTMENT_OTHER)
Admission: EM | Admit: 2024-02-25 | Discharge: 2024-02-25 | Disposition: A | Discharge status: Home / Self Care | Attending: Emergency Medicine | Admitting: Emergency Medicine

## 2024-02-25 ENCOUNTER — Emergency Department (HOSPITAL_BASED_OUTPATIENT_CLINIC_OR_DEPARTMENT_OTHER): Admitting: Radiology

## 2024-02-25 DIAGNOSIS — J45909 Unspecified asthma, uncomplicated: Secondary | ICD-10-CM | POA: Diagnosis not present

## 2024-02-25 DIAGNOSIS — Z91040 Latex allergy status: Secondary | ICD-10-CM | POA: Insufficient documentation

## 2024-02-25 DIAGNOSIS — Z8616 Personal history of COVID-19: Secondary | ICD-10-CM | POA: Insufficient documentation

## 2024-02-25 DIAGNOSIS — R0602 Shortness of breath: Secondary | ICD-10-CM | POA: Diagnosis present

## 2024-02-25 DIAGNOSIS — J189 Pneumonia, unspecified organism: Secondary | ICD-10-CM

## 2024-02-25 DIAGNOSIS — I10 Essential (primary) hypertension: Secondary | ICD-10-CM | POA: Insufficient documentation

## 2024-02-25 DIAGNOSIS — Z79899 Other long term (current) drug therapy: Secondary | ICD-10-CM | POA: Insufficient documentation

## 2024-02-25 DIAGNOSIS — J181 Lobar pneumonia, unspecified organism: Secondary | ICD-10-CM | POA: Insufficient documentation

## 2024-02-25 LAB — BASIC METABOLIC PANEL WITH GFR
Anion gap: 16 — ABNORMAL HIGH (ref 5–15)
BUN: 10 mg/dL (ref 6–20)
CO2: 23 mmol/L (ref 22–32)
Calcium: 9.8 mg/dL (ref 8.9–10.3)
Chloride: 99 mmol/L (ref 98–111)
Creatinine, Ser: 0.82 mg/dL (ref 0.44–1.00)
GFR, Estimated: >60 mL/min (ref >60)
Glucose, Bld: 152 mg/dL — ABNORMAL HIGH (ref 70–99)
Potassium: 3.8 mmol/L (ref 3.5–5.1)
Sodium: 138 mmol/L (ref 135–145)

## 2024-02-25 LAB — CBC
HCT: 31.7 % — ABNORMAL LOW (ref 36.0–46.0)
Hemoglobin: 8.9 g/dL — ABNORMAL LOW (ref 12.0–15.0)
MCH: 16 pg — ABNORMAL LOW (ref 26.0–34.0)
MCHC: 28.1 g/dL — ABNORMAL LOW (ref 30.0–36.0)
MCV: 57.1 fL — ABNORMAL LOW (ref 80.0–100.0)
Platelets: 187 10*3/uL (ref 150–400)
RBC: 5.55 MIL/uL — ABNORMAL HIGH (ref 3.87–5.11)
RDW: 23.8 % — ABNORMAL HIGH (ref 11.5–15.5)
WBC: 7.4 10*3/uL (ref 4.0–10.5)
nRBC: 0 % (ref 0.0–0.2)

## 2024-02-25 LAB — TROPONIN T, HIGH SENSITIVITY: Troponin T High Sensitivity: <15 ng/L (ref <19)

## 2024-02-25 MED ORDER — AMOXICILLIN-POT CLAVULANATE 875-125 MG PO TABS: 1.0000 | ORAL_TABLET | Freq: Two times a day (BID) | ORAL | 0 refills | Status: AC

## 2024-02-25 MED ORDER — AZITHROMYCIN 250 MG PO TABS: 250.0000 mg | ORAL_TABLET | Freq: Every day | ORAL | 0 refills | Status: DC

## 2024-02-25 MED ORDER — ALBUTEROL SULFATE (2.5 MG/3ML) 0.083% IN NEBU
5.0000 mg | INHALATION_SOLUTION | Freq: Once | RESPIRATORY_TRACT | Status: AC
  Administered 2024-02-25: 5 mg via RESPIRATORY_TRACT
  Filled 2024-02-25: qty 6

## 2024-02-25 MED ORDER — AZITHROMYCIN 250 MG PO TABS: 250.0000 mg | ORAL_TABLET | Freq: Every day | ORAL | 0 refills | Status: AC

## 2024-02-25 MED ORDER — AMOXICILLIN-POT CLAVULANATE 875-125 MG PO TABS: 1.0000 | ORAL_TABLET | Freq: Two times a day (BID) | ORAL | 0 refills | Status: DC

## 2024-02-25 MED ORDER — AMOXICILLIN-POT CLAVULANATE 875-125 MG PO TABS
1.0000 | ORAL_TABLET | Freq: Once | ORAL | Status: AC
  Administered 2024-02-25: 1 via ORAL
  Filled 2024-02-25: qty 1

## 2024-02-25 MED ORDER — IPRATROPIUM BROMIDE 0.02 % IN SOLN
0.5000 mg | Freq: Once | RESPIRATORY_TRACT | Status: AC
  Administered 2024-02-25: 0.5 mg via RESPIRATORY_TRACT
  Filled 2024-02-25: qty 2.5

## 2024-02-25 MED ORDER — AZITHROMYCIN 250 MG PO TABS
500.0000 mg | ORAL_TABLET | Freq: Once | ORAL | Status: AC
  Administered 2024-02-25: 500 mg via ORAL
  Filled 2024-02-25: qty 2

## 2024-02-25 NOTE — ED Notes (Signed)
 Patient transported to X-ray

## 2024-02-25 NOTE — ED Provider Notes (Signed)
 [place] EMERGENCY DEPARTMENT AT [facility] Provider Note   CSN: [id number] Arrival date & time: 02/25/24  1950     History {Add pertinent medical, surgical, social history, OB history to HPI:1} No chief complaint on file.   Brenda [name] is a 38 y.o. female.  HPI Patient with history of asthma.  Shortness of breath chest pain over the last couple days.  Breathing treatment yesterday without much relief.  No sputum production.  No fevers.  No definite sick contacts and states that she mostly stays in the house.  She does have children but states they are not sick.   Past Medical History:  Diagnosis Date   Abnormal Pap smear    Class 3 severe obesity due to excess calories with serious comorbidity and body mass index (BMI) of 40.0 to 44.9 in adult [facility]) 02/16/2020   History of COVID-19 02/16/2020   Hypertension    Iron deficiency anemia due to chronic blood loss 02/16/2020   Mild intermittent asthma, uncomplicated 02/16/2020   Nausea vomiting and diarrhea 12/06/2018   Pneumonia     Home Medications Prior to Admission medications   Medication Sig Start Date End Date Taking? Authorizing Provider  albuterol  (PROVENTIL  HFA;VENTOLIN  HFA) 108 (90 Base) MCG/ACT inhaler Inhale 1-2 puffs into the lungs every 6 (six) hours as needed for wheezing or shortness of breath. 11/28/18   Darlis Eisenmenger, PA-C  furosemide  (LASIX ) 20 MG tablet Take 1 tablet (20 mg total) by mouth daily as needed for fluid. Patient taking differently: Take 20 mg by mouth daily. 12/07/18   Lena Qualia, MD  losartan-hydrochlorothiazide (HYZAAR) 100-25 MG tablet Take 1 tablet by mouth daily.    [provider]  Multiple Vitamin (MULTIVITAMIN WITH MINERALS) TABS tablet Take 1 tablet by mouth daily.    [provider]      Allergies    Oxycodone hcl, Latex, and Terconazole    Review of Systems   Review of Systems  Physical Exam Updated Vital Signs BP (!) 191/99 (BP Location:  Right Arm)   Pulse 97   Temp 98.7 F (37.1 C)   Resp (!) 21   Ht 5\' 9"  (1.753 m)   Wt 136.1 kg   LMP 02/22/2024 (Exact Date)   SpO2 97%   BMI 44.30 kg/m  Physical Exam Vitals and nursing note reviewed.  HENT:     Head: Normocephalic.  Eyes:     Pupils: Pupils are equal, round, and reactive to light.  Cardiovascular:     Rate and Rhythm: Regular rhythm.  Pulmonary:     Breath sounds: Wheezing present.     Comments: Mild diffuse wheezes without focal rales or rhonchi.  No respiratory distress. Musculoskeletal:     Right lower leg: No edema.     Left lower leg: No edema.  Skin:    General: Skin is warm.     Capillary Refill: Capillary refill takes less than 2 seconds.  Neurological:     Mental Status: She is alert and oriented to person, place, and time.     ED Results / Procedures / Treatments   Labs (all labs ordered are listed, but only abnormal results are displayed) Labs Reviewed  BASIC METABOLIC PANEL WITH GFR - Abnormal; Notable for the following components:      Result Value   Glucose, Bld 152 (*)    Anion gap 16 (*)    All other components within normal limits  CBC - Abnormal; Notable for the following  components:   RBC 5.55 (*)    Hemoglobin 8.9 (*)    HCT 31.7 (*)    MCV 57.1 (*)    MCH 16.0 (*)    MCHC 28.1 (*)    RDW 23.8 (*)    All other components within normal limits  PREGNANCY, URINE  TROPONIN T, HIGH SENSITIVITY    EKG EKG Interpretation Date/Time:  Monday February 25 2024 19:55:15 EDT Ventricular Rate:  93 PR Interval:  145 QRS Duration:  98 QT Interval:  357 QTC Calculation: 444 R Axis:   124  Text Interpretation: Sinus rhythm Right atrial enlargement Right ventricular hypertrophy Confirmed by Mozell Arias [phone]) on 02/25/2024 8:04:41 PM  Radiology DG Chest 2 View Result Date: 02/25/2024 CLINICAL DATA:  Chest pain and cough for 2 days EXAM: CHEST - 2 VIEW COMPARISON:  10/05/2023 FINDINGS: Cardiac shadow is stable. Patchy airspace  opacity is noted in the left upper lobe consistent with acute pneumonia. No sizable effusion is seen. No other focal abnormality is noted. IMPRESSION: Left upper lobe pneumonia. Followup PA and lateral chest X-ray is recommended in 3-4 weeks following trial of antibiotic therapy to ensure resolution and exclude underlying malignancy. Electronically Signed   By: Violeta Grey M.D.   On: 02/25/2024 20:10    Procedures Procedures  {Document cardiac monitor, telemetry assessment procedure when appropriate:1}  Medications Ordered in ED Medications  albuterol  (PROVENTIL ) (2.5 MG/3ML) 0.083% nebulizer solution 5 mg (5 mg Nebulization Given 02/25/24 2014)  ipratropium (ATROVENT) nebulizer solution 0.5 mg (0.5 mg Nebulization Given 02/25/24 2016)    ED Course/ Medical Decision Making/ A&P   {   Click here for ABCD2, HEART and other calculatorsREFRESH Note before signing :1}                              Medical Decision Making Amount and/or Complexity of Data Reviewed Labs: ordered. Radiology: ordered.  Risk Prescription drug management.   Patient with shortness of breath and cough.  Has had for the last couple days.  Differential diagnosis includes asthma/pneumonia/viral infection.  Will get x-ray.  Will get basic blood work including viral testing.  Will give breathing treatment here.  {Document critical care time when appropriate:1} {Document review of labs and clinical decision tools ie heart score, Chads2Vasc2 etc:1}  {Document your independent review of radiology images, and any outside records:1} {Document your discussion with family members, caretakers, and with consultants:1} {Document social determinants of health affecting pt's care:1} {Document your decision making why or why not admission, treatments were needed:1} Final Clinical Impression(s) / ED Diagnoses Final diagnoses:  None    Rx / DC Orders ED Discharge Orders     None

## 2024-02-25 NOTE — ED Triage Notes (Signed)
 Pt POV reporting cough, mid chest pain, and SOB that began yesterday. Hx asthma, two breathing treatments yesterday with no improvement.
